# Patient Record
Sex: Male | Born: 1950 | Race: White | Hispanic: No | Marital: Married | Smoking: Former smoker
Health system: Southern US, Community
[De-identification: ages and names within clinical notes are randomized; demographics above are authoritative.]

## PROBLEM LIST (undated history)

## (undated) DIAGNOSIS — R0602 Shortness of breath: Secondary | ICD-10-CM

## (undated) DIAGNOSIS — IMO0002 Reserved for concepts with insufficient information to code with codable children: Secondary | ICD-10-CM

## (undated) DIAGNOSIS — IMO0001 Reserved for inherently not codable concepts without codable children: Secondary | ICD-10-CM

## (undated) DIAGNOSIS — C61 Malignant neoplasm of prostate: Secondary | ICD-10-CM

## (undated) DIAGNOSIS — N529 Male erectile dysfunction, unspecified: Secondary | ICD-10-CM

## (undated) DIAGNOSIS — R109 Unspecified abdominal pain: Secondary | ICD-10-CM

## (undated) DIAGNOSIS — K4020 Bilateral inguinal hernia, without obstruction or gangrene, not specified as recurrent: Secondary | ICD-10-CM

## (undated) DIAGNOSIS — N393 Stress incontinence (female) (male): Secondary | ICD-10-CM

## (undated) DIAGNOSIS — I10 Essential (primary) hypertension: Secondary | ICD-10-CM

## (undated) DIAGNOSIS — M1990 Unspecified osteoarthritis, unspecified site: Secondary | ICD-10-CM

## (undated) DIAGNOSIS — C801 Malignant (primary) neoplasm, unspecified: Secondary | ICD-10-CM

## (undated) HISTORY — DX: Reserved for concepts with insufficient information to code with codable children: IMO0002

## (undated) HISTORY — DX: Shortness of breath: R06.02

## (undated) HISTORY — DX: Reserved for inherently not codable concepts without codable children: IMO0001

---

## 1975-02-10 HISTORY — PX: HERNIA REPAIR: SHX51

## 2004-02-28 ENCOUNTER — Ambulatory Visit: Payer: Self-pay | Admitting: Internal Medicine

## 2011-02-10 HISTORY — PX: BIOPSY PROSTATE: PRO28

## 2011-04-02 ENCOUNTER — Other Ambulatory Visit (HOSPITAL_COMMUNITY): Payer: Self-pay | Admitting: Urology

## 2011-04-02 DIAGNOSIS — C61 Malignant neoplasm of prostate: Secondary | ICD-10-CM

## 2011-04-06 ENCOUNTER — Other Ambulatory Visit: Payer: Self-pay | Admitting: Urology

## 2011-04-09 ENCOUNTER — Ambulatory Visit (HOSPITAL_COMMUNITY)
Admission: RE | Admit: 2011-04-09 | Discharge: 2011-04-09 | Disposition: A | Discharge status: Home / Self Care | Payer: BC Managed Care – PPO | Source: Ambulatory Visit | Attending: Urology | Admitting: Urology

## 2011-04-09 DIAGNOSIS — C61 Malignant neoplasm of prostate: Secondary | ICD-10-CM

## 2011-04-09 MED ORDER — TECHNETIUM TC 99M MEDRONATE IV KIT
27.0000 | PACK | Freq: Once | INTRAVENOUS | Status: AC | PRN
  Administered 2011-04-09: 27 via INTRAVENOUS

## 2011-04-13 ENCOUNTER — Ambulatory Visit (HOSPITAL_COMMUNITY)
Admission: RE | Admit: 2011-04-13 | Discharge: 2011-04-13 | Disposition: A | Discharge status: Home / Self Care | Payer: BC Managed Care – PPO | Source: Ambulatory Visit | Attending: Urology | Admitting: Urology

## 2011-04-13 ENCOUNTER — Encounter (HOSPITAL_COMMUNITY)
Admission: RE | Admit: 2011-04-13 | Discharge: 2011-04-13 | Disposition: A | Discharge status: Home / Self Care | Payer: BC Managed Care – PPO | Source: Ambulatory Visit | Attending: Urology | Admitting: Urology

## 2011-04-13 ENCOUNTER — Encounter (HOSPITAL_COMMUNITY): Payer: Self-pay | Admitting: Pharmacy Technician

## 2011-04-13 ENCOUNTER — Encounter (HOSPITAL_COMMUNITY): Payer: Self-pay

## 2011-04-13 DIAGNOSIS — K4020 Bilateral inguinal hernia, without obstruction or gangrene, not specified as recurrent: Secondary | ICD-10-CM

## 2011-04-13 DIAGNOSIS — C801 Malignant (primary) neoplasm, unspecified: Secondary | ICD-10-CM

## 2011-04-13 DIAGNOSIS — I10 Essential (primary) hypertension: Secondary | ICD-10-CM | POA: Insufficient documentation

## 2011-04-13 DIAGNOSIS — Z1389 Encounter for screening for other disorder: Secondary | ICD-10-CM | POA: Insufficient documentation

## 2011-04-13 DIAGNOSIS — K4090 Unilateral inguinal hernia, without obstruction or gangrene, not specified as recurrent: Secondary | ICD-10-CM | POA: Insufficient documentation

## 2011-04-13 DIAGNOSIS — C61 Malignant neoplasm of prostate: Secondary | ICD-10-CM

## 2011-04-13 HISTORY — DX: Malignant (primary) neoplasm, unspecified: C80.1

## 2011-04-13 HISTORY — DX: Essential (primary) hypertension: I10

## 2011-04-13 HISTORY — DX: Bilateral inguinal hernia, without obstruction or gangrene, not specified as recurrent: K40.20

## 2011-04-13 LAB — CBC
HCT: 43.3 % (ref 39.0–52.0)
MCH: 30.9 pg (ref 26.0–34.0)
MCV: 88 fL (ref 78.0–100.0)
Platelets: 185 10*3/uL (ref 150–400)
RBC: 4.92 MIL/uL (ref 4.22–5.81)
RDW: 12.8 % (ref 11.5–15.5)

## 2011-04-13 LAB — BASIC METABOLIC PANEL
BUN: 13 mg/dL (ref 6–23)
CO2: 29 mEq/L (ref 19–32)
Calcium: 10 mg/dL (ref 8.4–10.5)
Chloride: 101 mEq/L (ref 96–112)
Creatinine, Ser: 1.01 mg/dL (ref 0.50–1.35)

## 2011-04-13 MED ORDER — GADOBENATE DIMEGLUMINE 529 MG/ML IV SOLN
20.0000 mL | Freq: Once | INTRAVENOUS | Status: AC | PRN
  Administered 2011-04-13: 20 mL via INTRAVENOUS

## 2011-04-13 NOTE — Patient Instructions (Addendum)
21 Karl Gentry  04/13/2011   Your procedure is scheduled on: 04-23-11  Report to [facility] at    0515   AM.  Call this number if you have problems the morning of surgery: [phone]   Remember:   Do not eat food:After Midnight.  Follow bowel prep instructions per Dr. Laverle Patter..  Take these medicines the morning of surgery with A SIP OF WATER: none. Take usual Pm meds day before.   Do not wear jewelry, make-up or nail polish.  Do not wear lotions, powders, or perfumes. You may wear deodorant.  Do not shave 48 hours prior to surgery.(may shave face and neck)  Do not bring valuables to the hospital.  Contacts, dentures or bridgework may not be worn into surgery.  Leave suitcase in the car. After surgery it may be brought to your room.  For patients admitted to the hospital, checkout time is 11:00 AM the day of discharge.   Patients discharged the day of surgery will not be allowed to drive home.  Name and phone number of your driver: [name]  Special Instructions: CHG Shower Use Special Wash: 1/2 bottle night before surgery and 1/2 bottle morning of surgery.(avoid face and genatalia).   Please read over the following fact sheets that you were given: MRSA Information, blood fact sheet, Incentive Spirometry instructions.   Your Pt has screened with an elevated risk for obstructive sleep apnea using the Stop-Bang tool during a presurgical  Visit. A score of four or greater is an elevated risk.

## 2011-04-13 NOTE — Pre-Procedure Instructions (Addendum)
CXR done 04-13-11 today. 04-14-11 EKG (10'12)received from PCP-placed with chart.W. Kennon Portela

## 2011-04-14 ENCOUNTER — Encounter (HOSPITAL_COMMUNITY): Payer: Self-pay

## 2011-04-22 NOTE — H&P (Signed)
Chief Complaint  Prostate cancer   History of Present Illness Karl Gentry is a 61 year old gentleman who was noted to have elevated PSA of 6.3 on a routine evaluation by Dr. Dossie Arbour. He was seen by Dr. Leonette Monarch and his repeat PSA was 5.5. He was recommended to undergo a prostate biopsy which he did on 03/12/11. 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 He has no family history of prostate cancer. He has been well informed about his treatment options and is leaning toward surgical therapy. He has not undergone any staging studies.  TNM stage: cT2b Nx Mx (left apical induration extending toward the left mid gland, no obvious extraprostatic extension) PSA: 5.5 Gleason score: 4+4 = 8 Prostate biopsy (03/12/11, [facility], read by Mardella Layman, Acc # [id number]): 5/12 cores positive    Left: Left lateral apex (11%, 4+4 = 8), left mid (20%, 3+3 = 6), left lateral base (16%, 3+3 = 6), left base (10%, 3+3 = 6)    Right: Right mid (4%, 3+4 = 7) Prostate volume: 36 cc  Urinary function: He does have frequent urinary frequency and a weak stream. The symptoms are not particular bothersome. IPSS 10/0. Erectile function: He has mild erectile dysfunction treated with Cialis 5 mg daily. SHIM score with Cialis: 23.      Past Medical History Problems  1. History of  Asymptomatic Hyperuricemia 790.6 2. History of  Hypertension 401.9 3. History of  Prostate Cancer V10.46  Surgical History Problems  1. History of  Hernia Repair Right  Current Meds 1. Allopurinol 300 MG Oral Tablet; Therapy: 14Nov2012 to 2. AmLODIPine Besylate 10 MG Oral Tablet; Therapy: 20Dec2012 to 3. Benicar 20 MG Oral Tablet; Therapy: 14Nov2012 to  Allergies Medication  1. No Known Drug Allergies  Family History Problems  1. Family history of  Breast Cancer V16.3 Denied  2. Family history of  Prostate Cancer  Social History Problems    Marital  History - Currently Married   Never A Smoker Denied    History of  Alcohol Use  Review of Systems Genitourinary, constitutional, skin, eye, otolaryngeal, hematologic/lymphatic, cardiovascular, pulmonary, endocrine, musculoskeletal, gastrointestinal, neurological and psychiatric system(s) were reviewed and pertinent findings if present are noted.  Integumentary: skin rash/lesion and pruritus.  Musculoskeletal: back pain and joint pain.    Vitals Vital Signs [Data Includes: Last 1 Day]  21Feb2013 09:00AM  BMI Calculated: 35.62 BSA Calculated: 2.19 Height: 5 ft 8 in Weight: 235 lb  Blood Pressure: 120 / 70 Heart Rate: 96  Physical Exam Constitutional: Well nourished and well developed . No acute distress.  ENT:. The ears and nose are normal in appearance.  Neck: The appearance of the neck is normal and no neck mass is present.  Pulmonary: No respiratory distress, normal respiratory rhythm and effort and clear bilateral breath sounds.  Cardiovascular: Heart rate and rhythm are normal . No peripheral edema.  Abdomen: The abdomen is mildly obese. The abdomen is soft and nontender. No masses are palpated. No CVA tenderness. No hernias are palpable. No hepatosplenomegaly noted.  Rectal: Rectal exam demonstrates normal sphincter tone, no tenderness and no masses. Prostate size is estimated to be 45 g. He does have induration extending from the left apex upper and left mid gland concerning for malignancy. There is no definite prostatic extension. The prostate has no nodularity and is not tender. The left seminal vesicle is nonpalpable. The right seminal vesicle is nonpalpable. The perineum is  normal on inspection.  Lymphatics: The femoral and inguinal nodes are not enlarged or tender.  Skin: Normal skin turgor, no visible rash and no visible skin lesions.  Neuro/Psych:. Mood and affect are appropriate.    Results/Data Urine [Data Includes: Last 1 Day]   21Feb2013 COLOR YELLOW   APPEARANCE CLEAR  SPECIFIC GRAVITY 1.025  pH 5.5  GLUCOSE NEG mg/dL BILIRUBIN NEG  KETONE NEG mg/dL BLOOD NEG  PROTEIN NEG mg/dL UROBILINOGEN 0.2 mg/dL NITRITE NEG  LEUKOCYTE ESTERASE NEG    I have independently reviewed his medical records, pathology report, and PSA results and findings as dictated above.   Assessment Assessed  1. Prostate Cancer 185  Plan Health Maintenance (V70.0)  1. UA With REFLEX  Done: 21Feb2013 08:50AM Prostate Cancer (185)  2. Diazepam 10 MG Oral Tablet; TAKE 1 TABLET Once Take 1 hour prior to MRI; Therapy:  21Feb2013 to (Complete:22Feb2013); Last Rx:21Feb2013 3. CREATININE with eGFR  Done: 21Feb2013 4. VENIPUNCTURE  Done: 21Feb2013 5. Follow-up Schedule Surgery Office  Follow-up  Done: 21Feb2013 6. PT Referral Referral  Referral  Requested for: 04Mar2013  Discussion/Summary  1. Prostate cancer: I recommended that he proceed with staging studies considering his high risk localized prostate cancer. He will undergo bone scan imaging and an endorectal MRI of the prostate and pelvis. His MRI will also help to evaluate local extension of the prostate cancer particularly on the left side of the gland. Assuming no evidence for metastatic disease, we have reviewed options including both primary surgery and radiation therapy. Considering his high-risk disease, we have discussed androgen deprivation therapy in addition to radiation. He would like to proceed with surgical therapy and declines a radiation oncology consultation.   The patient was counseled about the natural history of prostate cancer and the standard treatment options that are available for prostate cancer. It was explained to him how his age and life expectancy, clinical stage, Gleason score, and PSA affect his prognosis, the decision to proceed with additional staging studies, as well as how that information influences recommended treatment strategies. We discussed the roles for active surveillance,  radiation therapy, surgical therapy, androgen deprivation, as well as ablative therapy options for the treatment of prostate cancer as appropriate to his individual cancer situation. We discussed the risks and benefits of these options with regard to their impact on cancer control and also in terms of potential adverse events, complications, and impact on quiality of life particularly related to urinary, bowel, and sexual function. The patient was encouraged to ask questions throughout the discussion today and all questions were answered to his stated satisfaction. In addition, the patient was provided with and/or directed to appropriate resources and literature for further education about prostate cancer and treatment options.   We discussed surgical therapy for prostate cancer including the different available surgical approaches. We discussed, in detail, the risks and expectations of surgery with regard to cancer control, urinary control, and erectile function as well as the expected postoperative recovery process. Additional risks of surgery including but not limited to bleeding, infection, hernia formation, nerve damage, lymphocele formation, bowel/rectal injury potentially necessitating colostomy, damage to the urinary tract resulting in urine leakage, urethral stricture, and the cardiopulmonary risks such as myocardial infarction, stroke, death, venothromboembolism, etc. were explained. The risk of open surgical conversion for robotic/laparoscopic prostatectomy was also discussed.     He will tentatively be scheduled for a right nerve sparing (with possible partial left nerve sparing versus non-nerve sparing) robotic prostatectomy and pelvic lymphadenectomy.  A  total of 85 minutes were spent in the overall care of the patient today with 65 minutes in direct face to face consultation.    Amendment  Bone scan was negative for metastases. MRI also negative for local extension or lymph node involvement.  Patient informed.  Amended By: Heloise Purpura; 04/14/2011 2:40 PMEST   Bone scan was negative for metastases. Amended By: Heloise Purpura; 04/10/2011 7:42 AMEST

## 2011-04-23 ENCOUNTER — Encounter (HOSPITAL_COMMUNITY): Payer: Self-pay

## 2011-04-23 ENCOUNTER — Encounter (HOSPITAL_COMMUNITY): Payer: Self-pay | Admitting: Anesthesiology

## 2011-04-23 ENCOUNTER — Encounter (HOSPITAL_COMMUNITY)
Admission: RE | Disposition: A | Discharge status: Home / Self Care | Payer: Self-pay | Source: Ambulatory Visit | Attending: Urology

## 2011-04-23 ENCOUNTER — Ambulatory Visit (HOSPITAL_COMMUNITY): Payer: BC Managed Care – PPO | Admitting: Anesthesiology

## 2011-04-23 ENCOUNTER — Inpatient Hospital Stay (HOSPITAL_COMMUNITY)
Admission: RE | Admit: 2011-04-23 | Discharge: 2011-04-24 | DRG: 335 | Disposition: A | Discharge status: Home / Self Care | Payer: BC Managed Care – PPO | Source: Ambulatory Visit | Attending: Urology | Admitting: Urology

## 2011-04-23 DIAGNOSIS — C61 Malignant neoplasm of prostate: Secondary | ICD-10-CM

## 2011-04-23 DIAGNOSIS — I10 Essential (primary) hypertension: Secondary | ICD-10-CM | POA: Diagnosis present

## 2011-04-23 DIAGNOSIS — Z8546 Personal history of malignant neoplasm of prostate: Secondary | ICD-10-CM | POA: Insufficient documentation

## 2011-04-23 HISTORY — DX: Malignant neoplasm of prostate: C61

## 2011-04-23 HISTORY — PX: ROBOT ASSISTED LAPAROSCOPIC RADICAL PROSTATECTOMY: SHX5141

## 2011-04-23 LAB — ABO/RH: ABO/RH(D): A POS

## 2011-04-23 LAB — TYPE AND SCREEN: ABO/RH(D): A POS

## 2011-04-23 SURGERY — ROBOTIC ASSISTED LAPAROSCOPIC RADICAL PROSTATECTOMY LEVEL 2
Anesthesia: General | Site: Pelvis | Wound class: Clean Contaminated

## 2011-04-23 MED ORDER — HYDROCODONE-ACETAMINOPHEN 5-325 MG PO TABS: 1.0000 | ORAL_TABLET | Freq: Four times a day (QID) | ORAL | Status: AC | PRN

## 2011-04-23 MED ORDER — FENTANYL CITRATE 0.05 MG/ML IJ SOLN
INTRAMUSCULAR | Status: DC | PRN
  Administered 2011-04-23: 100 ug via INTRAVENOUS
  Administered 2011-04-23: 50 ug via INTRAVENOUS
  Administered 2011-04-23: 100 ug via INTRAVENOUS
  Administered 2011-04-23 (×2): 50 ug via INTRAVENOUS

## 2011-04-23 MED ORDER — EPHEDRINE SULFATE 50 MG/ML IJ SOLN
INTRAMUSCULAR | Status: DC | PRN
  Administered 2011-04-23 (×3): 5 mg via INTRAVENOUS

## 2011-04-23 MED ORDER — NEOSTIGMINE METHYLSULFATE 1 MG/ML IJ SOLN
INTRAMUSCULAR | Status: DC | PRN
  Administered 2011-04-23: 4 mg via INTRAVENOUS

## 2011-04-23 MED ORDER — CEFAZOLIN SODIUM-DEXTROSE 2-3 GM-% IV SOLR
2.0000 g | Freq: Once | INTRAVENOUS | Status: AC
  Administered 2011-04-23: 2 g via INTRAVENOUS

## 2011-04-23 MED ORDER — ACETAMINOPHEN 325 MG PO TABS: 650.0000 mg | ORAL_TABLET | ORAL | Status: DC | PRN

## 2011-04-23 MED ORDER — MORPHINE SULFATE 2 MG/ML IJ SOLN: 2.0000 mg | INTRAMUSCULAR | Status: DC | PRN

## 2011-04-23 MED ORDER — LACTATED RINGERS IV SOLN
INTRAVENOUS | Status: DC | PRN
  Administered 2011-04-23 (×3): via INTRAVENOUS

## 2011-04-23 MED ORDER — CEFAZOLIN SODIUM 1-5 GM-% IV SOLN
INTRAVENOUS | Status: AC
  Filled 2011-04-23: qty 100

## 2011-04-23 MED ORDER — ALLOPURINOL 300 MG PO TABS
300.0000 mg | ORAL_TABLET | Freq: Every day | ORAL | Status: DC
  Administered 2011-04-23 – 2011-04-24 (×2): 300 mg via ORAL
  Filled 2011-04-23 (×2): qty 1

## 2011-04-23 MED ORDER — HYDROMORPHONE HCL PF 1 MG/ML IJ SOLN
0.2500 mg | INTRAMUSCULAR | Status: DC | PRN
  Administered 2011-04-23: 0.25 mg via INTRAVENOUS
  Administered 2011-04-23: 0.5 mg via INTRAVENOUS

## 2011-04-23 MED ORDER — ACETAMINOPHEN 10 MG/ML IV SOLN
INTRAVENOUS | Status: AC
  Filled 2011-04-23: qty 100

## 2011-04-23 MED ORDER — DIPHENHYDRAMINE HCL 50 MG/ML IJ SOLN: 12.5000 mg | Freq: Four times a day (QID) | INTRAMUSCULAR | Status: DC | PRN

## 2011-04-23 MED ORDER — BUPIVACAINE-EPINEPHRINE 0.25% -1:200000 IJ SOLN
INTRAMUSCULAR | Status: DC | PRN
  Administered 2011-04-23: 27 mL

## 2011-04-23 MED ORDER — HYDROMORPHONE HCL PF 1 MG/ML IJ SOLN
INTRAMUSCULAR | Status: AC
  Administered 2011-04-23: 0.25 mg via INTRAVENOUS
  Filled 2011-04-23: qty 1

## 2011-04-23 MED ORDER — DOCUSATE SODIUM 100 MG PO CAPS
100.0000 mg | ORAL_CAPSULE | Freq: Two times a day (BID) | ORAL | Status: DC
  Administered 2011-04-23 – 2011-04-24 (×3): 100 mg via ORAL
  Filled 2011-04-23 (×4): qty 1

## 2011-04-23 MED ORDER — STERILE WATER FOR IRRIGATION IR SOLN
Status: DC | PRN
  Administered 2011-04-23: 3000 mL

## 2011-04-23 MED ORDER — CISATRACURIUM BESYLATE 2 MG/ML IV SOLN
INTRAVENOUS | Status: DC | PRN
  Administered 2011-04-23 (×4): 2 mg via INTRAVENOUS
  Administered 2011-04-23: 12 mg via INTRAVENOUS
  Administered 2011-04-23: 6 mg via INTRAVENOUS

## 2011-04-23 MED ORDER — MIDAZOLAM HCL 5 MG/5ML IJ SOLN
INTRAMUSCULAR | Status: DC | PRN
  Administered 2011-04-23: 2 mg via INTRAVENOUS

## 2011-04-23 MED ORDER — KCL IN DEXTROSE-NACL 20-5-0.45 MEQ/L-%-% IV SOLN
INTRAVENOUS | Status: DC
  Administered 2011-04-23: 16:00:00 via INTRAVENOUS
  Filled 2011-04-23 (×5): qty 1000

## 2011-04-23 MED ORDER — HEPARIN SODIUM (PORCINE) 1000 UNIT/ML IJ SOLN
INTRAMUSCULAR | Status: AC
  Filled 2011-04-23: qty 1

## 2011-04-23 MED ORDER — SODIUM CHLORIDE 0.9 % IV BOLUS (SEPSIS)
1000.0000 mL | Freq: Once | INTRAVENOUS | Status: AC
  Administered 2011-04-23: 1000 mL via INTRAVENOUS

## 2011-04-23 MED ORDER — INDIGOTINDISULFONATE SODIUM 8 MG/ML IJ SOLN
INTRAMUSCULAR | Status: AC
  Filled 2011-04-23: qty 10

## 2011-04-23 MED ORDER — KETOROLAC TROMETHAMINE 15 MG/ML IJ SOLN
15.0000 mg | Freq: Four times a day (QID) | INTRAMUSCULAR | Status: DC
  Administered 2011-04-23 – 2011-04-24 (×3): 15 mg via INTRAVENOUS
  Filled 2011-04-23 (×4): qty 1

## 2011-04-23 MED ORDER — DIPHENHYDRAMINE HCL 12.5 MG/5ML PO ELIX: 12.5000 mg | ORAL_SOLUTION | Freq: Four times a day (QID) | ORAL | Status: DC | PRN

## 2011-04-23 MED ORDER — SODIUM CHLORIDE 0.9 % IR SOLN
Status: DC | PRN
  Administered 2011-04-23: 1000 mL

## 2011-04-23 MED ORDER — ACETAMINOPHEN 10 MG/ML IV SOLN
INTRAVENOUS | Status: DC | PRN
  Administered 2011-04-23: 1000 mg via INTRAVENOUS

## 2011-04-23 MED ORDER — KETOROLAC TROMETHAMINE 15 MG/ML IJ SOLN
INTRAMUSCULAR | Status: AC
  Filled 2011-04-23: qty 1

## 2011-04-23 MED ORDER — INDIGOTINDISULFONATE SODIUM 8 MG/ML IJ SOLN
INTRAMUSCULAR | Status: DC | PRN
  Administered 2011-04-23 (×2): 5 mL via INTRAVENOUS

## 2011-04-23 MED ORDER — LIDOCAINE HCL (CARDIAC) 20 MG/ML IV SOLN
INTRAVENOUS | Status: DC | PRN
  Administered 2011-04-23: 75 mg via INTRAVENOUS

## 2011-04-23 MED ORDER — PROPOFOL 10 MG/ML IV BOLUS
INTRAVENOUS | Status: DC | PRN
  Administered 2011-04-23: 200 mg via INTRAVENOUS

## 2011-04-23 MED ORDER — ONDANSETRON HCL 4 MG/2ML IJ SOLN
INTRAMUSCULAR | Status: DC | PRN
  Administered 2011-04-23: 4 mg via INTRAVENOUS

## 2011-04-23 MED ORDER — CIPROFLOXACIN HCL 500 MG PO TABS: 500.0000 mg | ORAL_TABLET | Freq: Two times a day (BID) | ORAL | Status: AC

## 2011-04-23 MED ORDER — AMLODIPINE BESYLATE 10 MG PO TABS
10.0000 mg | ORAL_TABLET | Freq: Every day | ORAL | Status: DC
  Administered 2011-04-23 – 2011-04-24 (×2): 10 mg via ORAL
  Filled 2011-04-23 (×2): qty 1

## 2011-04-23 MED ORDER — LACTATED RINGERS IV SOLN
INTRAVENOUS | Status: DC | PRN
  Administered 2011-04-23: 09:00:00

## 2011-04-23 MED ORDER — LACTATED RINGERS IV SOLN
INTRAVENOUS | Status: DC
  Administered 2011-04-23: 12:00:00 via INTRAVENOUS

## 2011-04-23 MED ORDER — CEFAZOLIN SODIUM 1-5 GM-% IV SOLN
1.0000 g | Freq: Three times a day (TID) | INTRAVENOUS | Status: AC
  Administered 2011-04-23 (×2): 1 g via INTRAVENOUS
  Filled 2011-04-23 (×2): qty 50

## 2011-04-23 MED ORDER — GLYCOPYRROLATE 0.2 MG/ML IJ SOLN
INTRAMUSCULAR | Status: DC | PRN
  Administered 2011-04-23: 0.6 mg via INTRAVENOUS

## 2011-04-23 MED ORDER — BUPIVACAINE-EPINEPHRINE 0.25% -1:200000 IJ SOLN
INTRAMUSCULAR | Status: AC
  Filled 2011-04-23: qty 1

## 2011-04-23 SURGICAL SUPPLY — 35 items
CANISTER SUCTION 2500CC (MISCELLANEOUS) ×3 IMPLANT
CATH ROBINSON RED A/P 8FR (CATHETERS) ×3 IMPLANT
CHLORAPREP W/TINT 26ML (MISCELLANEOUS) ×3 IMPLANT
CLIP LIGATING HEM O LOK PURPLE (MISCELLANEOUS) ×6 IMPLANT
CLOTH BEACON ORANGE TIMEOUT ST (SAFETY) ×3 IMPLANT
CORD HIGH FREQUENCY UNIPOLAR (ELECTROSURGICAL) ×3 IMPLANT
COVER SURGICAL LIGHT HANDLE (MISCELLANEOUS) ×3 IMPLANT
COVER TIP SHEARS 8 DVNC (MISCELLANEOUS) ×2 IMPLANT
COVER TIP SHEARS 8MM DA VINCI (MISCELLANEOUS) ×1
CUTTER ECHEON FLEX ENDO 45 340 (ENDOMECHANICALS) ×3 IMPLANT
DECANTER SPIKE VIAL GLASS SM (MISCELLANEOUS) IMPLANT
DRAPE SURG IRRIG POUCH 19X23 (DRAPES) ×3 IMPLANT
DRSG TEGADERM 6X8 (GAUZE/BANDAGES/DRESSINGS) IMPLANT
ELECT REM PT RETURN 9FT ADLT (ELECTROSURGICAL) ×3
ELECTRODE REM PT RTRN 9FT ADLT (ELECTROSURGICAL) ×2 IMPLANT
GLOVE BIO SURGEON STRL SZ 6.5 (GLOVE) ×3 IMPLANT
GLOVE BIOGEL M STRL SZ7.5 (GLOVE) ×6 IMPLANT
GOWN STRL NON-REIN LRG LVL3 (GOWN DISPOSABLE) ×6 IMPLANT
GOWN STRL REIN XL XLG (GOWN DISPOSABLE) ×6 IMPLANT
HOLDER FOLEY CATH W/STRAP (MISCELLANEOUS) ×3 IMPLANT
IV LACTATED RINGERS 1000ML (IV SOLUTION) IMPLANT
KIT ACCESSORY DA VINCI DISP (KITS) ×1
KIT ACCESSORY DVNC DISP (KITS) ×2 IMPLANT
NDL SAFETY ECLIPSE 18X1.5 (NEEDLE) ×2 IMPLANT
NEEDLE HYPO 18GX1.5 SHARP (NEEDLE) ×1
PACK ROBOT UROLOGY CUSTOM (CUSTOM PROCEDURE TRAY) ×3 IMPLANT
RELOAD GREEN ECHELON 45 (STAPLE) ×3 IMPLANT
SET TUBE IRRIG SUCTION NO TIP (IRRIGATION / IRRIGATOR) ×3 IMPLANT
SOLUTION ELECTROLUBE (MISCELLANEOUS) ×3 IMPLANT
SPONGE GAUZE 4X4 12PLY (GAUZE/BANDAGES/DRESSINGS) IMPLANT
SUT VICRYL 0 UR6 27IN ABS (SUTURE) ×6 IMPLANT
SYR 27GX1/2 1ML LL SAFETY (SYRINGE) ×3 IMPLANT
TOWEL OR 17X26 10 PK STRL BLUE (TOWEL DISPOSABLE) ×3 IMPLANT
TOWEL OR NON WOVEN STRL DISP B (DISPOSABLE) ×3 IMPLANT
WATER STERILE IRR 1500ML POUR (IV SOLUTION) ×6 IMPLANT

## 2011-04-23 NOTE — Interval H&P Note (Signed)
History and Physical Interval Note:  04/23/2011 7:18 AM  Karl Gentry  has presented today for surgery, with the diagnosis of Prostate Cancer  The various methods of treatment have been discussed with the patient and family. After consideration of risks, benefits and other options for treatment, the patient has consented to  Procedure(s) (LRB): ROBOTIC ASSISTED LAPAROSCOPIC RADICAL PROSTATECTOMY LEVEL 2 (N/A) LYMPHADENECTOMY (Bilateral) as a surgical intervention .  The patients' history has been reviewed, patient examined, no change in status, stable for surgery.   Questions were answered to the patient's satisfaction.     Almalik Weissberg,LES

## 2011-04-23 NOTE — Anesthesia Postprocedure Evaluation (Signed)
  Anesthesia Post-op Note  Patient: Karl Gentry  Procedure(s) Performed: Procedure(s) (LRB): ROBOTIC ASSISTED LAPAROSCOPIC RADICAL PROSTATECTOMY LEVEL 2 (N/A) LYMPHADENECTOMY (Bilateral)  Patient Location: PACU  Anesthesia Type: General  Level of Consciousness: oriented and sedated  Airway and Oxygen Therapy: Patient Spontanous Breathing and Patient connected to nasal cannula oxygen  Post-op Pain: mild  Post-op Assessment: Post-op Vital signs reviewed, Patient's Cardiovascular Status Stable, Respiratory Function Stable and Patent Airway  Post-op Vital Signs: stable  Complications: No apparent anesthesia complications

## 2011-04-23 NOTE — Transfer of Care (Signed)
Immediate Anesthesia Transfer of Care Note  Patient: Karl Gentry  Procedure(s) Performed: Procedure(s) (LRB): ROBOTIC ASSISTED LAPAROSCOPIC RADICAL PROSTATECTOMY LEVEL 2 (N/A) LYMPHADENECTOMY (Bilateral)  Patient Location: PACU  Anesthesia Type: General  Level of Consciousness: awake, oriented and patient cooperative  Airway & Oxygen Therapy: Patient Spontanous Breathing and Patient connected to face mask oxygen  Post-op Assessment: Report given to PACU RN and Post -op Vital signs reviewed and stable  Post vital signs: Reviewed and stable  Complications: No apparent anesthesia complications

## 2011-04-23 NOTE — Discharge Instructions (Signed)

## 2011-04-23 NOTE — Progress Notes (Signed)
Patient ID: Karl Gentry, male   DOB: 1950-06-28, 61 y.o.   MRN: [id number]  Post-op note  Subjective: The patient is doing well.  No complaints.  Objective: Vital signs in last 24 hours: Temp:  [97.3 F (36.3 C)-97.8 F (36.6 C)] 97.5 F (36.4 C) (03/14 1235) Pulse Rate:  [83-91] 90  (03/14 1235) Resp:  [10-18] 16  (03/14 1235) BP: (95-123)/(52-72) 123/72 mmHg (03/14 1235) SpO2:  [94 %-100 %] 94 % (03/14 1235) Weight:  [106.595 kg (235 lb)] 106.595 kg (235 lb) (03/14 1235)  Intake/Output from previous day:   Intake/Output this shift: Total I/O In: 4260 [I.V.:3000; Other:210; IV Piggyback:1050] Out: 555 [Urine:395; Drains:110; Blood:50]  Physical Exam:  General: Alert and oriented. Abdomen: Soft, Nondistended. Incisions: Clean and dry.  Lab Results:  Basename 04/23/11 1056  HGB 13.5  HCT 39.5    Assessment/Plan: POD#0   1) Continue to monitor   [name]. MD   LOS: 0 days   Asheley Hellberg,LES 04/23/2011, 5:09 PM

## 2011-04-23 NOTE — Op Note (Signed)
Preoperative diagnosis: Clinically localized adenocarcinoma of the prostate (clinical stage cT2b N0 M0)  Postoperative diagnosis: Clinically localized adenocarcinoma of the prostate (clinical stage cT2b N0 M0)  Procedure:  1. Robotic assisted laparoscopic radical prostatectomy (bilateral nerve sparing - partial on left) 2. Bilateral robotic assisted laparoscopic pelvic lymphadenectomy  Surgeon: [name]. M.D.  Assistant: Pecola Leisure, PA-C  Anesthesia: General  Complications: None  EBL: 50 mL  IVF:  2000 mL crystalloid  Specimens: 1. Prostate and seminal vesicles 2. Right pelvic lymph nodes 3. Left pelvic lymph nodes  Disposition of specimens: Pathology  Drains: 1. 20 Fr coude catheter 2. # 19 Blake pelvic drain  Indication: Karl Gentry is a 61 y.o. year old patient with clinically localized prostate cancer.  After a thorough review of the management options for treatment of prostate cancer, he elected to proceed with surgical therapy and the above procedure(s).  We have discussed the potential benefits and risks of the procedure, side effects of the proposed treatment, the likelihood of the patient achieving the goals of the procedure, and any potential problems that might occur during the procedure or recuperation. Informed consent has been obtained.  Description of procedure:  The patient was taken to the operating room and a general anesthetic was administered. He was given preoperative antibiotics, placed in the dorsal lithotomy position, and prepped and draped in the usual sterile fashion. Next a preoperative timeout was performed. A urethral catheter was placed into the bladder and a site was selected near the umbilicus for placement of the camera port. This was placed using a standard open Hassan technique which allowed entry into the peritoneal cavity under direct vision and without difficulty. A 12 mm port was placed and a pneumoperitoneum  established. The camera was then used to inspect the abdomen and there was no evidence of any intra-abdominal injuries or other abnormalities. The remaining abdominal ports were then placed. 8 mm robotic ports were placed in the right lower quadrant, left lower quadrant, and far left lateral abdominal wall. A 5 mm port was placed in the right upper quadrant and a 12 mm port was placed in the right lateral abdominal wall for laparoscopic assistance. All ports were placed under direct vision without difficulty. The surgical cart was then docked.   Utilizing the cautery scissors, the bladder was reflected posteriorly allowing entry into the space of Retzius and identification of the endopelvic fascia and prostate. The periprostatic fat was then removed from the prostate allowing full exposure of the endopelvic fascia. The endopelvic fascia was then incised from the apex back to the base of the prostate bilaterally and the underlying levator muscle fibers were swept laterally off the prostate thereby isolating the dorsal venous complex. The dorsal vein was then stapled and divided with a 45 mm Flex Echelon stapler. Attention then turned to the bladder neck which was divided anteriorly thereby allowing entry into the bladder and exposure of the urethral catheter. The catheter balloon was deflated and the catheter was brought into the operative field and used to retract the prostate anteriorly. The posterior bladder neck was then examined and was divided allowing further dissection between the bladder and prostate posteriorly until the vasa deferentia and seminal vessels were identified. The vasa deferentia were isolated, divided, and lifted anteriorly. The seminal vesicles were dissected down to their tips with care to control the seminal vascular arterial blood supply. These structures were then lifted anteriorly and the space between Denonvillier's fascia and the anterior rectum was developed  with a combination of  sharp and blunt dissection. This isolated the vascular pedicles of the prostate.  The lateral prostatic fascia was then sharply incised allowing release of the neurovascular bundles bilaterally. The vascular pedicles of the prostate were then ligated with Weck clips between the prostate and neurovascular bundles and divided with sharp cold scissor dissection resulting in neurovascular bundle preservation. The neurovascular bundles were then separated off the apex of the prostate and urethra bilaterally. A partial nerve sparing techniques was employed on the left side with some periprostatic fat left on the prostate.  The urethra was then sharply transected allowing the prostate specimen to be disarticulated. The pelvis was copiously irrigated and hemostasis was ensured. There was no evidence for rectal injury.  Attention then turned to the right pelvic sidewall. The fibrofatty tissue between the external iliac vein, confluence of the iliac vessels, hypogastric artery, and Cooper's ligament was dissected free from the pelvic sidewall with care to preserve the obturator nerve. Weck clips were used for lymphostasis and hemostasis. An identical procedure was performed on the contralateral side and the lymphatic packets were removed for permanent pathologic analysis.  Attention then turned to the urethral anastomosis. A 2-0 Vicryl slip knot was placed between Denonvillier's fascia, the posterior bladder neck, and the posterior urethra to reapproximate these structures. A double-armed 3-0 Monocryl suture was then used to perform a 360 running tension-free anastomosis between the bladder neck and urethra. A new urethral catheter was then placed into the bladder and irrigated. There were no blood clots within the bladder and the anastomosis appeared to be watertight. A #19 Blake drain was then brought through the left lateral 8 mm port site and positioned appropriately within the pelvis. It was secured to the skin  with a nylon suture. The surgical cart was then undocked. The right lateral 12 mm port site was closed at the fascial level with a 0 Vicryl suture placed laparoscopically. All remaining ports were then removed under direct vision. The prostate specimen was removed intact within the Endopouch retrieval bag via the periumbilical camera port site. This fascial opening was closed with two running 0 Vicryl sutures. 0.25% Marcaine was then injected into all port sites and all incisions were reapproximated at the skin level with staples. Sterile dressings were applied. The patient appeared to tolerate the procedure well and without complications. The patient was able to be extubated and transferred to the recovery unit in satisfactory condition.   [name] MD

## 2011-04-23 NOTE — Anesthesia Preprocedure Evaluation (Signed)
Anesthesia Evaluation  Patient identified by MRN, date of birth, ID band Patient awake    Reviewed: Allergy & Precautions, H&P , NPO status , Patient's Chart, lab work & pertinent test results, reviewed documented beta blocker date and time   Airway Mallampati: II TM Distance: >3 FB Neck ROM: Full    Dental  (+) Teeth Intact   Pulmonary neg pulmonary ROS,  breath sounds clear to auscultation        Cardiovascular hypertension, Pt. on medications Rhythm:Regular Rate:Normal  Denies cardiac symptoms   Neuro/Psych negative neurological ROS  negative psych ROS   GI/Hepatic negative GI ROS, Neg liver ROS,   Endo/Other  negative endocrine ROS  Renal/GU negative Renal ROS   Prostate cancer    Musculoskeletal negative musculoskeletal ROS (+)   Abdominal   Peds negative pediatric ROS (+)  Hematology negative hematology ROS (+)   Anesthesia Other Findings   Reproductive/Obstetrics negative OB ROS                           Anesthesia Physical Anesthesia Plan  ASA: II  Anesthesia Plan: General   Post-op Pain Management:    Induction: Intravenous  Airway Management Planned: Oral ETT  Additional Equipment:   Intra-op Plan:   Post-operative Plan: Extubation in OR  Informed Consent:   Dental advisory given  Plan Discussed with: CRNA and Surgeon  Anesthesia Plan Comments:         Anesthesia Quick Evaluation

## 2011-04-24 MED ORDER — HYDROCODONE-ACETAMINOPHEN 5-325 MG PO TABS
1.0000 | ORAL_TABLET | Freq: Four times a day (QID) | ORAL | Status: DC | PRN
  Administered 2011-04-24: 1 via ORAL
  Filled 2011-04-24: qty 1

## 2011-04-24 MED ORDER — BISACODYL 10 MG RE SUPP
10.0000 mg | Freq: Once | RECTAL | Status: AC
  Administered 2011-04-24: 10 mg via RECTAL
  Filled 2011-04-24: qty 1

## 2011-04-24 NOTE — Discharge Summary (Signed)
Date of admission: 04/23/2011  Date of discharge: 04/24/2011  Admission diagnosis: Prostate Cancer  Discharge diagnosis: Prostate Cancer  History and Physical: For full details, please see admission history and physical. Briefly, Karl Gentry is a 61 y.o. gentleman with localized prostate cancer.  After discussing management/treatment options, he elected to proceed with surgical treatment.  Hospital Course: Karl Gentry was taken to the operating room on 04/23/2011 and underwent a robotic assisted laparoscopic radical prostatectomy. He tolerated this procedure well and without complications. Postoperatively, he was able to be transferred to a regular hospital room following recovery from anesthesia.  He was able to begin ambulating the night of surgery. He remained hemodynamically stable overnight.  He had excellent urine output with appropriately minimal output from his pelvic drain and his pelvic drain was removed on POD #1.  He was transitioned to oral pain medication, tolerated a clear liquid diet, and had met all discharge criteria and was able to be discharged home later on POD#1.  Laboratory values:  Basename 04/24/11 0430 04/23/11 1056  HGB 13.2 13.5  HCT 39.8 39.5    Disposition: Home  Discharge instruction: He was instructed to be ambulatory but to refrain from heavy lifting, strenuous activity, or driving. He was instructed on urethral catheter care.  Discharge medications:   Medication List  As of 04/24/2011  1:01 PM   START taking these medications         ciprofloxacin 500 MG tablet   Commonly known as: CIPRO   Take 1 tablet (500 mg total) by mouth 2 (two) times daily. Start day prior to office visit for foley removal      HYDROcodone-acetaminophen 5-325 MG per tablet   Commonly known as: NORCO   Take 1-2 tablets by mouth every 6 (six) hours as needed for pain.         CONTINUE taking these medications         allopurinol 300 MG tablet   Commonly known as:  ZYLOPRIM      amLODipine 10 MG tablet   Commonly known as: NORVASC      olmesartan 20 MG tablet   Commonly known as: BENICAR          Where to get your medications    These are the prescriptions that you need to pick up.   You may get these medications from any pharmacy.         ciprofloxacin 500 MG tablet   HYDROcodone-acetaminophen 5-325 MG per tablet            Followup: He will followup in 1 week for catheter removal and to discuss his surgical pathology results.

## 2011-04-24 NOTE — Progress Notes (Signed)
Patient ID: Karl Gentry, male   DOB: 06-07-1950, 62 y.o.   MRN: [id number]  1 Day Post-Op Subjective: The patient is doing well.  No nausea or vomiting. Pain is adequately controlled.  Objective: Vital signs in last 24 hours: Temp:  [97.4 F (36.3 C)-99 F (37.2 C)] 99 F (37.2 C) (03/15 0544) Pulse Rate:  [81-92] 92  (03/15 0544) Resp:  [10-16] 16  (03/15 0544) BP: (95-131)/(52-72) 131/71 mmHg (03/15 0544) SpO2:  [94 %-100 %] 95 % (03/15 0544) Weight:  [106.595 kg (235 lb)] 106.595 kg (235 lb) (03/14 1235)  Intake/Output from previous day: 03/14 0701 - 03/15 0700 In: 7430 [P.O.:900; I.V.:5230; IV Piggyback:1050] Out: 3725 [Urine:3495; Drains:180; Blood:50] Intake/Output this shift:    Physical Exam:  General: Alert and oriented. CV: RRR Lungs: Clear bilaterally. GI: Soft, Nondistended. Incisions: Dressings intact. Urine: Clear Extremities: Nontender, no erythema, no edema.  Lab Results:  Basename 04/24/11 0430 04/23/11 1056  HGB 13.2 13.5  HCT 39.8 39.5      Assessment/Plan: POD# 1 s/p robotic prostatectomy.  1) SL IVF 2) Ambulate, Incentive spirometry 3) Transition to oral pain medication 4) Dulcolax suppository 5) D/C pelvic drain 6) Plan for likely discharge later today   Karl Gentry. MD   LOS: 1 day   Karl Gentry,LES 04/24/2011, 7:10 AM

## 2011-05-06 ENCOUNTER — Encounter (HOSPITAL_COMMUNITY): Payer: Self-pay | Admitting: Urology

## 2011-05-20 ENCOUNTER — Ambulatory Visit: Payer: Self-pay

## 2012-01-27 NOTE — Progress Notes (Signed)
New Consult Prostate Cancer  Biopsy=3/14/213,Radical resection/Prostatectomy,=Adenocarcinoma gleason= 4+3=7 [place] Biopsy done 03/12/11=gleason=4+4=8, & 3+3=6Volume=36cc,PSA=5.5  PSA  01/21/2012=0.23  Married, alert,oriented x3, 2 children, no dysuria, Mother breast cancer living, father colon cancer living, no c/o pain  Allergies:NKDA   No pacemaker No radiation

## 2012-01-27 NOTE — Progress Notes (Signed)
New Consult Prostate Cancer Biopsy

## 2012-01-28 ENCOUNTER — Ambulatory Visit
Admission: RE | Admit: 2012-01-28 | Discharge: 2012-01-28 | Disposition: A | Discharge status: Home / Self Care | Payer: BC Managed Care – PPO | Source: Ambulatory Visit | Attending: Radiation Oncology | Admitting: Radiation Oncology

## 2012-01-28 ENCOUNTER — Encounter: Payer: Self-pay | Admitting: Radiation Oncology

## 2012-01-28 VITALS — BP 126/78 | HR 92 | Temp 98.3°F | Resp 20 | Ht 67.0 in | Wt 244.0 lb

## 2012-01-28 DIAGNOSIS — Z803 Family history of malignant neoplasm of breast: Secondary | ICD-10-CM | POA: Insufficient documentation

## 2012-01-28 DIAGNOSIS — C61 Malignant neoplasm of prostate: Secondary | ICD-10-CM

## 2012-01-28 DIAGNOSIS — N529 Male erectile dysfunction, unspecified: Secondary | ICD-10-CM | POA: Insufficient documentation

## 2012-01-28 DIAGNOSIS — I10 Essential (primary) hypertension: Secondary | ICD-10-CM | POA: Insufficient documentation

## 2012-01-28 DIAGNOSIS — Z7982 Long term (current) use of aspirin: Secondary | ICD-10-CM | POA: Insufficient documentation

## 2012-01-28 DIAGNOSIS — Z79899 Other long term (current) drug therapy: Secondary | ICD-10-CM | POA: Insufficient documentation

## 2012-01-28 DIAGNOSIS — Z800 Family history of malignant neoplasm of digestive organs: Secondary | ICD-10-CM | POA: Insufficient documentation

## 2012-01-28 DIAGNOSIS — Z87891 Personal history of nicotine dependence: Secondary | ICD-10-CM | POA: Insufficient documentation

## 2012-01-28 HISTORY — DX: Unspecified osteoarthritis, unspecified site: M19.90

## 2012-01-28 HISTORY — DX: Malignant neoplasm of prostate: C61

## 2012-01-28 HISTORY — DX: Stress incontinence (female) (male): N39.3

## 2012-01-28 HISTORY — DX: Male erectile dysfunction, unspecified: N52.9

## 2012-01-28 NOTE — Addendum Note (Signed)
Encounter addended by: Maryln Gottron, MD on: 01/28/2012  6:22 PM<BR>     Documentation filed: Notes Section

## 2012-01-28 NOTE — Progress Notes (Signed)
Please see the Nurse Progress Note in the MD Initial Consult Encounter for this patient. 

## 2012-01-28 NOTE — Progress Notes (Addendum)
[facility] Radiation Oncology NEW PATIENT EVALUATION  Name: Karl Gentry MRN: [id number]  Date:   01/28/2012           DOB: May 15, 1950  Status: outpatient   CC:   Karl Mc, MD ,  Dr. Dossie Arbour, PCP   REFERRING PHYSICIAN: Crecencio Mc, MD   DIAGNOSIS: PSA recurrent carcinoma the prostate   HISTORY OF PRESENT ILLNESS:  Karl Gentry is a 61 y.o. male who is seen today for the courtesy Dr. Laverle Patter for discussion of possible salvage radiation therapy in the management of his PSA recurrent carcinoma the prostate. He presented with a PSA of 6.3 on routine evaluation by his primary care physician. A repeat PSA was 5.5. He underwent ultrasound-guided biopsies by Dr. Leonette Monarch on March 12, 2011 revealing a Gleason score of 8 (4+4) with 5 of 12 biopsy cores positive for malignancy. Dr. Laverle Patter appreciated right apical induration extending towards the right mid gland. His prostate volume was 36 cc. His preoperative MRI scan on 04/13/2011 showed a highly suspicious area within left apex with no evidence of spread to the seminal vesicle. A bone scan on February 28 was without evidence for metastatic disease. He underwent a robotic prostatectomy by Dr. Laverle Patter on 04/23/2011. He was found to have  Gleason 7 (4+3) involving less than 5% of the prostatic parenchyma. There was involvement of the apex and true margins were negative, but tumor was seen less than 1 mm from the capsular surface at the left apex.. 3 right pelvic lymph nodes and 2 left pelvic lymph nodes were free of metastatic disease. His first postoperative PSA on 10/23/2011 was 0.17. A followup PSA on 01/15/2012 was 0.22 and a PSA on 01/21/2012 was 0.23. He is doing reasonable well from a GU and GI standpoint., He does have slight stress incontinence when bending over to work. He is dry at home. He does have erectile dysfunction. No GI difficulties.  PREVIOUS RADIATION THERAPY: No   PAST MEDICAL HISTORY:  has a past medical history  of Hypertension; Inguinal hernia bilateral, non-recurrent (04-13-11); Cancer (04-13-11); Prostate cancer (04/23/11); ED (erectile dysfunction); Stress incontinence, male; and Arthritis.     PAST SURGICAL HISTORY:  Past Surgical History  Procedure Date  . Hernia repair 04-13-11    '76 -open rt. right inguinal hernia repair/mesh  . Biopsy prostate 04-13-11    bx. 2'13 [place] [facility]  . Robot assisted laparoscopic radical prostatectomy 04/23/2011    Procedure: ROBOTIC ASSISTED LAPAROSCOPIC RADICAL PROSTATECTOMY LEVEL 2;  Surgeon: Karl Mc, MD;  Location: [facility]S;  Service: Urology;  Laterality: N/A;         FAMILY HISTORY: family history includes Cancer in his father and mother. His father died from complications of congestive heart failure. He also had colon cancer. His mother is alive with a history of breast cancer and Alzheimer's disease. No family history of prostate cancer.   SOCIAL HISTORY:  reports that he has never smoked. He has quit using smokeless tobacco. He reports that he does not drink alcohol or use illicit drugs. Married, 2 children. 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   ALLERGIES: Review of patient's allergies indicates no known allergies.   MEDICATIONS:  Current Outpatient Prescriptions  Medication Sig Dispense Refill  . amLODipine (NORVASC) 10 MG tablet Take 10 mg by mouth daily at 12 noon.      Marland Kitchen aspirin 81 MG tablet Take 81 mg by mouth daily.      Marland Kitchen  fish oil-omega-3 fatty acids 1000 MG capsule Take 2 g by mouth daily.      . Multiple Vitamin (MULTIVITAMIN) tablet Take 1 tablet by mouth daily.      Marland Kitchen olmesartan (BENICAR) 20 MG tablet Take 20 mg by mouth daily at 12 noon.      . tadalafil (CIALIS) 10 MG tablet Take 10 mg by mouth daily as needed.         REVIEW OF SYSTEMS:  Pertinent items are noted in HPI.    PHYSICAL EXAM:  height is 5\' 7"  (1.702 m) and weight is 244 lb (110.678 kg). His oral temperature is 98.3 F  (36.8 C). His blood pressure is 126/78 and his pulse is 92. His respiration is 20.   Alert and oriented. Head neck examination: Grossly unremarkable. Nodes: Without palpable cervical or supraclavicular lymphadenopathy. Chest: Lungs clear. Back: Without spinal or CVA tenderness. Heart: Regular in rhythm. Abdomen: Surgical scars, without masses organomegaly. Genitalia unremarkable to inspection. Rectal the prostate bed is flat and is without masses or nodularity. Extremities: Without edema. Neurologic examination: Grossly nonfocal.   LABORATORY DATA:  Lab Results  Component Value Date   WBC 6.7 04/13/2011   HGB 13.2 04/24/2011   HCT 39.8 04/24/2011   MCV 88.0 04/13/2011   PLT 185 04/13/2011   Lab Results  Component Value Date   NA 138 04/13/2011   K 4.1 04/13/2011   CL 101 04/13/2011   CO2 29 04/13/2011   No results found for this basename: ALT, AST, GGT, ALKPHOS, BILITOT   PSA from 01/21/2012 0.23   IMPRESSION: PSA recurrent carcinoma the prostate. I explained to the patient that he has a local recurrence alone, local and distant recurrence, or distant recurrence alone. Clinical predictors for having a local recurrence include a positive margin, disease-free interval, initial PSA of 7 or less and a slow PSA doubling time. The most important predictor is a positive margin. I reviewed his pathology with Dr. Adolphus Birchwood, and he verifies that there is less than 1 mm margin along his left apex. While this is not a positive margin, this is close enough to believe that he may be a risk for residual disease along his apex. He did have a brief disease-free interval, and his initial Gleason score with a pattern of 4 is certainly worrisome for the possibility of metastatic disease. I explained to the patient that his chance for salvage in this setting is probably no greater than 15-20%, but considering his young age and apical tumor involvement I would be willing to offer him radiation therapy which is his only chance for  cure.. We discussed the potential acute and late toxicities of radiation therapy which I think would be relatively well tolerated. We also talked about techniques including being treated with a comfortably full bladder to minimize urinary related radiation toxicity. I told the patient that I would contact him following review of his pathology, and I left a voicemail. He'll contact me if he wants to consider radiation therapy.   PLAN: As discussed above.   I spent 60 minutes minutes face to face with the patient and more than 50% of that time was spent in counseling and/or coordination of care.

## 2012-01-29 NOTE — Addendum Note (Signed)
Encounter addended by: Talyia Allende Mintz Elan Mcelvain, RN on: 01/29/2012  6:09 PM<BR>     Documentation filed: Charges VN

## 2012-02-18 ENCOUNTER — Encounter: Payer: Self-pay | Admitting: Radiation Oncology

## 2012-02-18 NOTE — Progress Notes (Signed)
Chart note: The patient called today and he wants to proceed with radiation therapy to his prostate bed in the management of his PSA recurrent carcinoma the prostate. I will have her return early next week for his simulation/treatment planning. We discussed planning and treating him with a comfortably full bladder.

## 2012-02-22 ENCOUNTER — Ambulatory Visit
Admission: RE | Admit: 2012-02-22 | Discharge: 2012-02-22 | Disposition: A | Discharge status: Home / Self Care | Payer: BC Managed Care – PPO | Source: Ambulatory Visit | Attending: Radiation Oncology | Admitting: Radiation Oncology

## 2012-02-22 ENCOUNTER — Telehealth: Payer: Self-pay | Admitting: Radiation Oncology

## 2012-02-22 DIAGNOSIS — C61 Malignant neoplasm of prostate: Secondary | ICD-10-CM | POA: Insufficient documentation

## 2012-02-22 DIAGNOSIS — Z510 Encounter for antineoplastic radiation therapy: Secondary | ICD-10-CM | POA: Insufficient documentation

## 2012-02-22 NOTE — Progress Notes (Signed)
Simulation/treatment planning note: The patient was taken to the CT simulator. A VAC LOC immobilization device was constructed. A red rubber cath was placed within the rectal vault. He was then catheterized and contrast instilled into the bladder/urethra. I contoured his high-risk prostate tumor bed, CTV 6600 and expanded this by 0.5 cm to create PTV 6600 which will receive 6600 cGy in 33 sessions. I also contoured avoidance structures including the bladder and rectum. He is now ready for IMRT simulation/treatment planning.

## 2012-02-22 NOTE — Telephone Encounter (Signed)
Met w patient to discuss RO billing. Pt had no financial concerns today.  Dx: Prostate  Attending Rad: RM  Rad Tx:  IMRT x 40

## 2012-02-25 ENCOUNTER — Encounter: Payer: Self-pay | Admitting: Radiation Oncology

## 2012-02-25 NOTE — Progress Notes (Signed)
IMRT simulation/treatment planning note: The patient completed IMRT simulation/planning in the management of his carcinoma the prostate. IMRT was chosen to decrease the risk for both acute and late rectal and bladder toxicity compared to 3-D conformal or conventional radiation therapy. Dose volume histograms were obtained for the target structure, high-risk prostate tumor bed. Dose volume histograms were also obtained for avoidance structures including the rectum, bladder, and femoral heads. We met our departmental goals. Please see the electronic medical record for specific dose volume histograms. I requesting daily MV CT setting up to his anterior rectum. He is be treated with a company full bladder.

## 2012-03-02 ENCOUNTER — Ambulatory Visit
Admission: RE | Admit: 2012-03-02 | Discharge: 2012-03-02 | Disposition: A | Discharge status: Home / Self Care | Payer: BC Managed Care – PPO | Source: Ambulatory Visit | Attending: Radiation Oncology | Admitting: Radiation Oncology

## 2012-03-02 DIAGNOSIS — C61 Malignant neoplasm of prostate: Secondary | ICD-10-CM

## 2012-03-02 NOTE — Progress Notes (Signed)
Chart note: The patient underwent Tomotherapy segmentation on 03/02/2012 for treatment of his prostate cancer. He is being treated to 6.0 delivered field widths corresponding to one set of IMRT treatment devices [phone])

## 2012-03-03 ENCOUNTER — Ambulatory Visit
Admission: RE | Admit: 2012-03-03 | Discharge: 2012-03-03 | Disposition: A | Discharge status: Home / Self Care | Payer: BC Managed Care – PPO | Source: Ambulatory Visit | Attending: Radiation Oncology | Admitting: Radiation Oncology

## 2012-03-04 ENCOUNTER — Ambulatory Visit
Admission: RE | Admit: 2012-03-04 | Discharge: 2012-03-04 | Disposition: A | Discharge status: Home / Self Care | Payer: BC Managed Care – PPO | Source: Ambulatory Visit | Attending: Radiation Oncology | Admitting: Radiation Oncology

## 2012-03-07 ENCOUNTER — Ambulatory Visit
Admission: RE | Admit: 2012-03-07 | Discharge: 2012-03-07 | Disposition: A | Discharge status: Home / Self Care | Payer: BC Managed Care – PPO | Source: Ambulatory Visit | Attending: Radiation Oncology | Admitting: Radiation Oncology

## 2012-03-07 ENCOUNTER — Encounter: Payer: Self-pay | Admitting: Radiation Oncology

## 2012-03-07 VITALS — BP 153/81 | HR 75 | Temp 98.0°F | Resp 20 | Wt 244.4 lb

## 2012-03-07 DIAGNOSIS — C61 Malignant neoplasm of prostate: Secondary | ICD-10-CM

## 2012-03-07 NOTE — Progress Notes (Signed)
Weekly Management Note:  Site: Prostate bed Current Dose:  800  cGy Projected Dose: 6600  cGy  Narrative: The patient is seen today for routine under treatment assessment. CBCT/MVCT images/port films were reviewed. The chart was reviewed.   Bladder filling acceptable, but not ideal. Images were reviewed with the patient. He did not feel that his bladder was performed today. No new GU or GI difficulties.  Physical Examination:  Filed Vitals:   03/07/12 1137  BP: 153/81  Pulse: 75  Temp: 98 F (36.7 C)  Resp: 20  .  Weight: 244 lb 6.4 oz (110.859 kg). No change.  Impression: Tolerating radiation therapy well.  Plan: Continue radiation therapy as planned.

## 2012-03-07 NOTE — Progress Notes (Signed)
Post sim ed completed, charted under post sim ed appt. Pt denies pain, urinary/bowel issues, fatigue, loss of appetite.

## 2012-03-08 ENCOUNTER — Ambulatory Visit
Admission: RE | Admit: 2012-03-08 | Discharge: 2012-03-08 | Disposition: A | Discharge status: Home / Self Care | Payer: BC Managed Care – PPO | Source: Ambulatory Visit | Attending: Radiation Oncology | Admitting: Radiation Oncology

## 2012-03-08 NOTE — Progress Notes (Addendum)
Post sim ed completed w/pt. Gave pt "Radiation and You" booklet w/all pertinent information marked and discussed, re: fatigue, bowel issues/care, urinary irritation/care, nutrition, pain. All questions answered.

## 2012-03-09 ENCOUNTER — Ambulatory Visit
Admission: RE | Admit: 2012-03-09 | Discharge: 2012-03-09 | Disposition: A | Discharge status: Home / Self Care | Payer: BC Managed Care – PPO | Source: Ambulatory Visit | Attending: Radiation Oncology | Admitting: Radiation Oncology

## 2012-03-10 ENCOUNTER — Ambulatory Visit
Admission: RE | Admit: 2012-03-10 | Discharge: 2012-03-10 | Disposition: A | Discharge status: Home / Self Care | Payer: BC Managed Care – PPO | Source: Ambulatory Visit | Attending: Radiation Oncology | Admitting: Radiation Oncology

## 2012-03-11 ENCOUNTER — Ambulatory Visit
Admission: RE | Admit: 2012-03-11 | Discharge: 2012-03-11 | Disposition: A | Discharge status: Home / Self Care | Payer: BC Managed Care – PPO | Source: Ambulatory Visit | Attending: Radiation Oncology | Admitting: Radiation Oncology

## 2012-03-14 ENCOUNTER — Ambulatory Visit
Admission: RE | Admit: 2012-03-14 | Discharge: 2012-03-14 | Disposition: A | Discharge status: Home / Self Care | Payer: BC Managed Care – PPO | Source: Ambulatory Visit | Attending: Radiation Oncology | Admitting: Radiation Oncology

## 2012-03-14 ENCOUNTER — Encounter: Payer: Self-pay | Admitting: Radiation Oncology

## 2012-03-14 VITALS — BP 151/73 | HR 82 | Temp 99.1°F | Resp 20 | Wt 242.3 lb

## 2012-03-14 DIAGNOSIS — C61 Malignant neoplasm of prostate: Secondary | ICD-10-CM

## 2012-03-14 NOTE — Progress Notes (Signed)
Patient here s/p 9/33 rad txs  prostate so far, no c/o pain, no nocturia,patient works 3rd shift, so he's not sure about fatigue, a little increased urgency not enough for him to be sure, no dysuria

## 2012-03-14 NOTE — Progress Notes (Signed)
Weekly Management Note:  Site: prostate bed Current Dose:  1800  cGy Projected Dose: 6600  cGy  Narrative: The patient is seen today for routine under treatment assessment. CBCT/MVCT images/port films were reviewed. The chart was reviewed.   Excellent bladder filling today. No new GU or GI difficulties.  Physical Examination:  Filed Vitals:   03/14/12 1109  BP: 151/73  Pulse: 82  Temp: 99.1 F (37.3 C)  Resp: 20  .  Weight: 242 lb 4.8 oz (109.907 kg). No change  Impression: Tolerating radiation therapy well.  Plan: Continue radiation therapy as planned.

## 2012-03-15 ENCOUNTER — Ambulatory Visit
Admission: RE | Admit: 2012-03-15 | Discharge: 2012-03-15 | Disposition: A | Discharge status: Home / Self Care | Payer: BC Managed Care – PPO | Source: Ambulatory Visit | Attending: Radiation Oncology | Admitting: Radiation Oncology

## 2012-03-16 ENCOUNTER — Ambulatory Visit
Admission: RE | Admit: 2012-03-16 | Discharge: 2012-03-16 | Disposition: A | Discharge status: Home / Self Care | Payer: BC Managed Care – PPO | Source: Ambulatory Visit | Attending: Radiation Oncology | Admitting: Radiation Oncology

## 2012-03-17 ENCOUNTER — Ambulatory Visit
Admission: RE | Admit: 2012-03-17 | Discharge: 2012-03-17 | Disposition: A | Discharge status: Home / Self Care | Payer: BC Managed Care – PPO | Source: Ambulatory Visit | Attending: Radiation Oncology | Admitting: Radiation Oncology

## 2012-03-18 ENCOUNTER — Ambulatory Visit
Admission: RE | Admit: 2012-03-18 | Discharge: 2012-03-18 | Disposition: A | Discharge status: Home / Self Care | Payer: BC Managed Care – PPO | Source: Ambulatory Visit | Attending: Radiation Oncology | Admitting: Radiation Oncology

## 2012-03-21 ENCOUNTER — Encounter: Payer: Self-pay | Admitting: Radiation Oncology

## 2012-03-21 ENCOUNTER — Ambulatory Visit
Admission: RE | Admit: 2012-03-21 | Discharge: 2012-03-21 | Disposition: A | Discharge status: Home / Self Care | Payer: BC Managed Care – PPO | Source: Ambulatory Visit | Attending: Radiation Oncology | Admitting: Radiation Oncology

## 2012-03-21 VITALS — BP 151/79 | HR 83 | Temp 98.2°F | Resp 20 | Wt 245.3 lb

## 2012-03-21 DIAGNOSIS — C61 Malignant neoplasm of prostate: Secondary | ICD-10-CM

## 2012-03-21 NOTE — Progress Notes (Signed)
Weekly Management Note:  Site: Prostate bed Current Dose:  2800  cGy Projected Dose: 6600  cGy  Narrative: The patient is seen today for routine under treatment assessment. CBCT/MVCT images/port films were reviewed. The chart was reviewed.  Bladder filling is borderline acceptable. No new GU or GI difficulties although he may be having more urinary frequency.  Physical Examination:  Filed Vitals:   03/21/12 1103  BP: 151/79  Pulse: 83  Temp: 98.2 F (36.8 C)  Resp: 20  .  Weight: 245 lb 4.8 oz (111.267 kg). No change .  Impression: Tolerating radiation therapy well. I encouraged him to make a better effort with respect to bladder filling. He did better last week.  Plan: Continue radiation therapy as planned.

## 2012-03-21 NOTE — Progress Notes (Signed)
Pt denies pain, loss of appetite, bladder/bowel issues. He states he is no more fatigued than his usual.

## 2012-03-22 ENCOUNTER — Ambulatory Visit
Admission: RE | Admit: 2012-03-22 | Discharge: 2012-03-22 | Disposition: A | Discharge status: Home / Self Care | Payer: BC Managed Care – PPO | Source: Ambulatory Visit | Attending: Radiation Oncology | Admitting: Radiation Oncology

## 2012-03-23 ENCOUNTER — Ambulatory Visit
Admission: RE | Admit: 2012-03-23 | Discharge: 2012-03-23 | Disposition: A | Discharge status: Home / Self Care | Payer: BC Managed Care – PPO | Source: Ambulatory Visit | Attending: Radiation Oncology | Admitting: Radiation Oncology

## 2012-03-24 ENCOUNTER — Ambulatory Visit
Admission: RE | Admit: 2012-03-24 | Discharge: 2012-03-24 | Disposition: A | Discharge status: Home / Self Care | Payer: BC Managed Care – PPO | Source: Ambulatory Visit | Attending: Radiation Oncology | Admitting: Radiation Oncology

## 2012-03-25 ENCOUNTER — Ambulatory Visit: Payer: BC Managed Care – PPO

## 2012-03-28 ENCOUNTER — Ambulatory Visit
Admission: RE | Admit: 2012-03-28 | Discharge: 2012-03-28 | Disposition: A | Discharge status: Home / Self Care | Payer: BC Managed Care – PPO | Source: Ambulatory Visit | Attending: Radiation Oncology | Admitting: Radiation Oncology

## 2012-03-28 ENCOUNTER — Encounter: Payer: Self-pay | Admitting: Radiation Oncology

## 2012-03-28 VITALS — BP 138/85 | HR 77 | Temp 98.3°F | Resp 20 | Wt 244.3 lb

## 2012-03-28 DIAGNOSIS — C61 Malignant neoplasm of prostate: Secondary | ICD-10-CM

## 2012-03-28 NOTE — Progress Notes (Signed)
Weekly Management Note:  Site: Prostate bed Current Dose:  3600   cGy Projected Dose: 6600  cGy  Narrative: The patient is seen today for routine under treatment assessment. CBCT/MVCT images/port films were reviewed. The chart was reviewed.   Bladder filling is suboptimal today. He did not feel as if his bladder was full. He does have some increasing urinary frequency and urgency but is otherwise doing well.  Physical Examination:  Filed Vitals:   03/28/12 1112  BP: 138/85  Pulse: 77  Temp: 98.3 F (36.8 C)  Resp: 20  .  Weight: 244 lb 4.8 oz (110.814 kg). No change.  Impression: Tolerating radiation therapy well. I encouraged him to improve his bladder filling to minimize his urinary toxicity.  Plan: Continue radiation therapy as planned.

## 2012-03-28 NOTE — Progress Notes (Addendum)
Patient here prostate  rad txs 18/ completed so far, alert,oriented x3, slight dysuria has started and increased urgency, stated , eating and drinking fair, says "I;m always tired",doesn't  Get up t night , regular bowels 11:14 AM

## 2012-03-29 ENCOUNTER — Ambulatory Visit
Admission: RE | Admit: 2012-03-29 | Discharge: 2012-03-29 | Disposition: A | Discharge status: Home / Self Care | Payer: BC Managed Care – PPO | Source: Ambulatory Visit | Attending: Radiation Oncology | Admitting: Radiation Oncology

## 2012-03-30 ENCOUNTER — Ambulatory Visit
Admission: RE | Admit: 2012-03-30 | Discharge: 2012-03-30 | Disposition: A | Discharge status: Home / Self Care | Payer: BC Managed Care – PPO | Source: Ambulatory Visit | Attending: Radiation Oncology | Admitting: Radiation Oncology

## 2012-03-31 ENCOUNTER — Ambulatory Visit
Admission: RE | Admit: 2012-03-31 | Discharge: 2012-03-31 | Disposition: A | Discharge status: Home / Self Care | Payer: BC Managed Care – PPO | Source: Ambulatory Visit | Attending: Radiation Oncology | Admitting: Radiation Oncology

## 2012-04-01 ENCOUNTER — Ambulatory Visit
Admission: RE | Admit: 2012-04-01 | Discharge: 2012-04-01 | Disposition: A | Discharge status: Home / Self Care | Payer: BC Managed Care – PPO | Source: Ambulatory Visit | Attending: Radiation Oncology | Admitting: Radiation Oncology

## 2012-04-04 ENCOUNTER — Encounter: Payer: Self-pay | Admitting: Radiation Oncology

## 2012-04-04 ENCOUNTER — Ambulatory Visit
Admission: RE | Admit: 2012-04-04 | Discharge: 2012-04-04 | Disposition: A | Discharge status: Home / Self Care | Payer: BC Managed Care – PPO | Source: Ambulatory Visit | Attending: Radiation Oncology | Admitting: Radiation Oncology

## 2012-04-04 ENCOUNTER — Ambulatory Visit
Admission: RE | Admit: 2012-04-04 | Payer: BC Managed Care – PPO | Source: Ambulatory Visit | Admitting: Radiation Oncology

## 2012-04-04 VITALS — BP 133/74 | HR 71 | Temp 98.6°F | Ht 68.0 in | Wt 244.5 lb

## 2012-04-04 DIAGNOSIS — C61 Malignant neoplasm of prostate: Secondary | ICD-10-CM

## 2012-04-04 NOTE — Progress Notes (Signed)
Weekly Management Note:  Site: Prostate bed Current Dose:  4600  cGy Projected Dose: 6600  cGy  Narrative: The patient is seen today for routine under treatment assessment. CBCT/MVCT images/port films were reviewed. The chart was reviewed.   Bladder filling today is suboptimal. He tells that his bladder do not feel full today. No GU or GI difficulties although he does have slight increasing urinary frequency and dysuria. His symptoms are not particularly bothersome .  Physical Examination:  Filed Vitals:   04/04/12 1107  BP: 133/74  Pulse: 71  Temp: 98.6 F (37 C)  .  Weight: 244 lb 8 oz (110.904 kg). No change .  Impression: Tolerating radiation therapy well. He'll try to improve his bladder filling.  Plan: Continue radiation therapy as planned.

## 2012-04-04 NOTE — Progress Notes (Signed)
23 fractions to the pelvis for prostate cancer C/o mild stinging each time he voids with frequency during the day.  He denies any nocturia, nor proctitis.  No changes noted in bowel pattern.  No fatigue presently.

## 2012-04-05 ENCOUNTER — Ambulatory Visit
Admission: RE | Admit: 2012-04-05 | Discharge: 2012-04-05 | Disposition: A | Discharge status: Home / Self Care | Payer: BC Managed Care – PPO | Source: Ambulatory Visit | Attending: Radiation Oncology | Admitting: Radiation Oncology

## 2012-04-06 ENCOUNTER — Ambulatory Visit
Admission: RE | Admit: 2012-04-06 | Discharge: 2012-04-06 | Disposition: A | Discharge status: Home / Self Care | Payer: BC Managed Care – PPO | Source: Ambulatory Visit | Attending: Radiation Oncology | Admitting: Radiation Oncology

## 2012-04-07 ENCOUNTER — Ambulatory Visit
Admission: RE | Admit: 2012-04-07 | Discharge: 2012-04-07 | Disposition: A | Discharge status: Home / Self Care | Payer: BC Managed Care – PPO | Source: Ambulatory Visit | Attending: Radiation Oncology | Admitting: Radiation Oncology

## 2012-04-08 ENCOUNTER — Ambulatory Visit
Admission: RE | Admit: 2012-04-08 | Discharge: 2012-04-08 | Disposition: A | Discharge status: Home / Self Care | Payer: BC Managed Care – PPO | Source: Ambulatory Visit | Attending: Radiation Oncology | Admitting: Radiation Oncology

## 2012-04-11 ENCOUNTER — Ambulatory Visit: Payer: BC Managed Care – PPO

## 2012-04-12 ENCOUNTER — Ambulatory Visit
Admission: RE | Admit: 2012-04-12 | Discharge: 2012-04-12 | Disposition: A | Discharge status: Home / Self Care | Payer: BC Managed Care – PPO | Source: Ambulatory Visit | Attending: Radiation Oncology | Admitting: Radiation Oncology

## 2012-04-12 ENCOUNTER — Encounter: Payer: Self-pay | Admitting: Radiation Oncology

## 2012-04-12 VITALS — BP 132/64 | HR 86 | Temp 97.6°F | Wt 242.6 lb

## 2012-04-12 DIAGNOSIS — C61 Malignant neoplasm of prostate: Secondary | ICD-10-CM

## 2012-04-12 NOTE — Progress Notes (Signed)
Weekly Management Note:  Site: Prostate Current Dose:  5600  cGy Projected Dose: 6600  cGy  Narrative: The patient is seen today for routine under treatment assessment. CBCT/MVCT images/port films were reviewed. The chart was reviewed.   Bladder filling is satisfactory. No new GU or GI difficulties.  Physical Examination:  Filed Vitals:   04/12/12 1046  BP: 132/64  Pulse: 86  Temp: 97.6 F (36.4 C)  .  Weight: 242 lb 9.6 oz (110.043 kg). No change .  Impression: Tolerating radiation therapy well.  Plan: Continue radiation therapy as planned.

## 2012-04-12 NOTE — Progress Notes (Signed)
Karl Gentry is here for his weekly under treatment visit.  He has had 28/33 fractions.  He denies pain at this time other than occasional burning with urination.  He does have frequency and urgency with urination.  He has had incontinence since prostate surgery which he states is getting better.  He denies nocturia.  He does have fatigue.

## 2012-04-13 ENCOUNTER — Ambulatory Visit
Admission: RE | Admit: 2012-04-13 | Discharge: 2012-04-13 | Disposition: A | Discharge status: Home / Self Care | Payer: BC Managed Care – PPO | Source: Ambulatory Visit | Attending: Radiation Oncology | Admitting: Radiation Oncology

## 2012-04-14 ENCOUNTER — Ambulatory Visit: Payer: BC Managed Care – PPO

## 2012-04-15 ENCOUNTER — Ambulatory Visit
Admission: RE | Admit: 2012-04-15 | Discharge: 2012-04-15 | Disposition: A | Discharge status: Home / Self Care | Payer: BC Managed Care – PPO | Source: Ambulatory Visit | Attending: Radiation Oncology | Admitting: Radiation Oncology

## 2012-04-18 ENCOUNTER — Ambulatory Visit
Admission: RE | Admit: 2012-04-18 | Discharge: 2012-04-18 | Disposition: A | Discharge status: Home / Self Care | Payer: BC Managed Care – PPO | Source: Ambulatory Visit | Attending: Radiation Oncology | Admitting: Radiation Oncology

## 2012-04-18 ENCOUNTER — Ambulatory Visit: Payer: BC Managed Care – PPO

## 2012-04-18 VITALS — BP 142/80 | HR 79 | Temp 98.3°F | Wt 242.1 lb

## 2012-04-18 DIAGNOSIS — C61 Malignant neoplasm of prostate: Secondary | ICD-10-CM

## 2012-04-18 NOTE — Progress Notes (Signed)
Weekly Management Note:  Site: Prostate bed Current Dose:  6200  cGy Projected Dose: 6600  cGy  Narrative: The patient is seen today for routine under treatment assessment. CBCT/MVCT images/port films were reviewed. The chart was reviewed.   Bladder filling is excellent. No new GU or GI difficulties. He finishes his radiation therapy this Wednesday.  Physical Examination:  Filed Vitals:   04/18/12 1111  BP: 142/80  Pulse: 79  Temp: 98.3 F (36.8 C)  .  Weight: 242 lb 1.6 oz (109.816 kg). No change.  Impression: Tolerating radiation therapy well.  Plan: Continue radiation therapy as planned. He'll finish his treatment this Wednesday and return to see me in one month.

## 2012-04-18 NOTE — Progress Notes (Signed)
Patient for routine weekly assessment of prostate cancer treatment.Completed 31 of 33 treatment.Denies pain.No nocturia.Urine stream good.Mild frequency and urgency.Bowels normal.Fatigue no mor than usual.

## 2012-04-19 ENCOUNTER — Ambulatory Visit: Payer: BC Managed Care – PPO

## 2012-04-19 ENCOUNTER — Ambulatory Visit
Admission: RE | Admit: 2012-04-19 | Discharge: 2012-04-19 | Disposition: A | Discharge status: Home / Self Care | Payer: BC Managed Care – PPO | Source: Ambulatory Visit | Attending: Radiation Oncology | Admitting: Radiation Oncology

## 2012-04-20 ENCOUNTER — Encounter: Payer: Self-pay | Admitting: Radiation Oncology

## 2012-04-20 ENCOUNTER — Ambulatory Visit: Payer: BC Managed Care – PPO

## 2012-04-20 ENCOUNTER — Ambulatory Visit
Admission: RE | Admit: 2012-04-20 | Discharge: 2012-04-20 | Disposition: A | Discharge status: Home / Self Care | Payer: BC Managed Care – PPO | Source: Ambulatory Visit | Attending: Radiation Oncology | Admitting: Radiation Oncology

## 2012-04-20 NOTE — Progress Notes (Signed)
[facility] Radiation Oncology End of Treatment Note  Name:Karl Gentry  Date: 04/20/2012 JYN:[id number] DOB:Oct 25, 1950   Status:outpatient    CC: Dr. Heloise Purpura, Dr. Dossie Arbour PCP  REFERRING PHYSICIAN:   Dr. Heloise Purpura    DIAGNOSIS: PSA recurrent carcinoma the prostate    INDICATION FOR TREATMENT: Curative   TREATMENT DATES: 03/02/2012 through 04/20/2012                          SITE/DOSE:    Prostate bed 6600 cGy 33 sessions                        BEAMS/ENERGY:   6 MV photons helical IMRT Tomotherapy               NARRATIVE:   The patient tolerated his treatment without significant GU or GI toxicity.                         PLAN: Routine followup in one month. Patient instructed to call if questions or worsening complaints in interim.

## 2012-04-21 ENCOUNTER — Ambulatory Visit: Payer: BC Managed Care – PPO

## 2012-04-22 ENCOUNTER — Ambulatory Visit: Payer: BC Managed Care – PPO

## 2012-04-25 ENCOUNTER — Ambulatory Visit: Payer: BC Managed Care – PPO

## 2012-04-26 ENCOUNTER — Ambulatory Visit: Payer: BC Managed Care – PPO

## 2012-05-20 ENCOUNTER — Encounter: Payer: Self-pay | Admitting: Oncology

## 2012-05-25 ENCOUNTER — Encounter: Payer: Self-pay | Admitting: Radiation Oncology

## 2012-05-25 ENCOUNTER — Ambulatory Visit
Admission: RE | Admit: 2012-05-25 | Discharge: 2012-05-25 | Disposition: A | Discharge status: Home / Self Care | Payer: BC Managed Care – PPO | Source: Ambulatory Visit | Attending: Radiation Oncology | Admitting: Radiation Oncology

## 2012-05-25 VITALS — BP 153/77 | HR 71 | Temp 99.0°F | Ht 68.0 in | Wt 244.6 lb

## 2012-05-25 DIAGNOSIS — C61 Malignant neoplasm of prostate: Secondary | ICD-10-CM

## 2012-05-25 NOTE — Progress Notes (Signed)
Karl Gentry here for follow up after 33 fractions to his prostate.  He denies pain and fatigue.  He did have urinary frequency and urgency after treatment but he states it has gone away now.  He denies hematuria, nocturia and diarrhea.

## 2012-05-25 NOTE — Progress Notes (Signed)
CC: Dr. Crecencio Mc, Dr. Dossie Arbour, PCP  Followup note: The patient returns today approximately 1 month following completion of "salvage" radiation therapy in the management of his PSA recurrent carcinoma the prostate. He is doing well from a GU and GI standpoint. He is back to his baseline habits. His PSA taken during his last week of radiation therapy had decreased to 0.12 from a pretreatment level of 0.23 on 01/21/2012. He plans to see Dr. Laverle Patter for a followup visit on August 14.  Physical examination: Alert and oriented.  Filed Vitals:   05/25/12 0902  BP: 153/77  Pulse: 71  Temp: 99 F (37.2 C)   Rectal examination not performed.  Impression: Satisfactory initial response to radiation therapy. He'll have a followup PSA in August when he sees Dr. Laverle Patter for a followup visit.  Plan: I've not scheduled the patient for a formal followup visit, and I ask that Dr. Laverle Patter keep me posted on his progress.

## 2013-04-30 ENCOUNTER — Ambulatory Visit: Payer: Self-pay

## 2014-09-30 ENCOUNTER — Ambulatory Visit: Payer: BLUE CROSS/BLUE SHIELD

## 2014-09-30 ENCOUNTER — Encounter: Payer: Self-pay | Admitting: Radiology

## 2014-09-30 ENCOUNTER — Ambulatory Visit
Admission: EM | Admit: 2014-09-30 | Discharge: 2014-09-30 | Disposition: A | Discharge status: Home / Self Care | Payer: BLUE CROSS/BLUE SHIELD | Attending: Family Medicine | Admitting: Family Medicine

## 2014-09-30 DIAGNOSIS — R05 Cough: Secondary | ICD-10-CM | POA: Diagnosis present

## 2014-09-30 DIAGNOSIS — J189 Pneumonia, unspecified organism: Secondary | ICD-10-CM

## 2014-09-30 MED ORDER — LEVOFLOXACIN 500 MG PO TABS: 500.0000 mg | ORAL_TABLET | Freq: Every day | ORAL | Status: DC

## 2014-09-30 MED ORDER — ALBUTEROL SULFATE HFA 108 (90 BASE) MCG/ACT IN AERS: 1.0000 | INHALATION_SPRAY | Freq: Four times a day (QID) | RESPIRATORY_TRACT | Status: DC | PRN

## 2014-09-30 NOTE — ED Provider Notes (Signed)
Patient presents today with symptoms of mild productive cough for the last 2 weeks. His temperature last night was 99. He denies any increased shortness of breath, leg swelling, chest pain, wheezing. He denies any history of smoking, increased alcohol use. He denies any history of asthma or COPD. He states that his cough is worse in the mornings. He denies any history of CHF. Has history of prostate cancer.  Review of systems negative except mentioned above. Vitals as noted per Epic.  OBJECTIVE: General: NAD HEENT: mild pharyngeal erythema, no exudate, no erythema of TMs, no cervical LAD Respiratory: mild rhonci on left, no wheezing appreciated, no accessory muscle use Cardiology: RRR Extrem: -Homans, no pitting edema appreciated  Neurological: CN II-XII grossly intact   ASSESSMENT: Left Sided Pneumonia   PLAN: Levaquin, Albuterol when necessary, Delsym when necessary, rest, hydration, repeat chest x-ray and 3-4 weeks for resolution, seek medical attention if symptoms persist or worsen. Work excuse given for 2 days. If any further problems with work, he is to follow-up with his primary care physician or here.       Paulina Fusi, MD 09/30/14 1007

## 2014-09-30 NOTE — Discharge Instructions (Signed)
Need repeat chest xray  in 4 weeks to see resolution of pneumonia. Can try Delysm for cough. Albuterol Inhaler as needed.

## 2014-09-30 NOTE — ED Notes (Signed)
Patient states that about 2 weeks ago he started with a cough and states that now he feels "rattling" when he breathes. He states that this has not improved with any OTC medications. States that he has been more tired than usual. He states that he has been having some production of mucus that is worse in the morning. He states that symptoms have been ongoing and constant.

## 2014-10-13 ENCOUNTER — Ambulatory Visit
Admission: EM | Admit: 2014-10-13 | Discharge: 2014-10-13 | Disposition: A | Discharge status: Home / Self Care | Payer: BLUE CROSS/BLUE SHIELD | Attending: Internal Medicine | Admitting: Internal Medicine

## 2014-10-13 DIAGNOSIS — H6983 Other specified disorders of Eustachian tube, bilateral: Secondary | ICD-10-CM

## 2014-10-13 DIAGNOSIS — R059 Cough, unspecified: Secondary | ICD-10-CM

## 2014-10-13 DIAGNOSIS — R05 Cough: Secondary | ICD-10-CM

## 2014-10-13 DIAGNOSIS — R0982 Postnasal drip: Secondary | ICD-10-CM

## 2014-10-13 MED ORDER — CETIRIZINE HCL 10 MG PO TABS: 10.0000 mg | ORAL_TABLET | Freq: Every day | ORAL | Status: DC

## 2014-10-13 MED ORDER — BENZONATATE 100 MG PO CAPS: 100.0000 mg | ORAL_CAPSULE | Freq: Three times a day (TID) | ORAL | Status: DC | PRN

## 2014-10-13 MED ORDER — IPRATROPIUM-ALBUTEROL 0.5-2.5 (3) MG/3ML IN SOLN
3.0000 mL | Freq: Once | RESPIRATORY_TRACT | Status: AC
Start: 2014-10-13 — End: 2014-10-13
  Administered 2014-10-13: 3 mL via RESPIRATORY_TRACT

## 2014-10-13 MED ORDER — HYDROCOD POLST-CPM POLST ER 10-8 MG/5ML PO SUER: 5.0000 mL | Freq: Every evening | ORAL | Status: DC | PRN

## 2014-10-13 MED ORDER — FLUTICASONE PROPIONATE 50 MCG/ACT NA SUSP: 2.0000 | Freq: Every day | NASAL | Status: DC

## 2014-10-13 NOTE — ED Provider Notes (Signed)
CSN: [id number]     Arrival date & time 10/13/14  1027 History   First MD Initiated Contact with Patient 10/13/14 1143     Chief Complaint  Patient presents with  . URI   (Consider location/radiation/quality/duration/timing/severity/associated sxs/prior Treatment) HPI  64 yo M returns to [facility]-treated 2 weeks ago for pneumonia- expressing frustration that his cough is still present. He feels much better overall. Has been afebrile. No longer feels rattling chest. Energy improved.  Having issues with post nasal drip and full feeling ears. 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 Symptoms increased after doing yard work yesterday,  Past Medical History  Diagnosis Date  . Hypertension   . Inguinal hernia bilateral, non-recurrent 04-13-11    states bilateral at present / umbilical hernia also  . Cancer 04-13-11    recent dx. prostate cancer, surgery planned  . Prostate cancer 04/23/11    bx=Adenocarcinoma,Gleason=4+3=7,PSA=5.5,volume  =36cc  . ED (erectile dysfunction)   . Stress incontinence, male   . Arthritis     right shoulder, no cartillage  . Radiation 03/02/2012-04/20/2012    prostate bed 6600 cGy   Past Surgical History  Procedure Laterality Date  . Hernia repair  04-13-11    '76 -open rt. right inguinal hernia repair/mesh  . Biopsy prostate  04-13-11    bx. 2'13 [facility]  . Robot assisted laparoscopic radical prostatectomy  04/23/2011    Procedure: ROBOTIC ASSISTED LAPAROSCOPIC RADICAL PROSTATECTOMY LEVEL 2;  Surgeon: Dutch Gray, MD;  Location: [facility];  Service: Urology;  Laterality: N/A;       Family History  Problem Relation Age of Onset  . Cancer Mother     breast age 22  . Cancer Father     colon age 18   Social History  Substance Use Topics  . Smoking status: Never Smoker   . Smokeless tobacco: Former Systems developer  . Alcohol Use: No    Review of Systems Review of 10 systems negative for acute change except as  referenced in HPI  Allergies  Review of patient's allergies indicates no known allergies.  Home Medications   Prior to Admission medications   Medication Sig Start Date End Date Taking? Authorizing Provider  albuterol (PROVENTIL HFA;VENTOLIN HFA) 108 (90 BASE) MCG/ACT inhaler Inhale 1-2 puffs into the lungs every 6 (six) hours as needed for wheezing or shortness of breath. 09/30/14   Paulina Fusi, MD  amLODipine (NORVASC) 10 MG tablet Take 10 mg by mouth daily at 12 noon.    Historical Provider, MD  aspirin 81 MG tablet Take 81 mg by mouth daily.    Historical Provider, MD  benzonatate (TESSALON) 100 MG capsule Take 1 capsule (100 mg total) by mouth 3 (three) times daily as needed for cough. 10/13/14   Jan Fireman, PA-C  cetirizine (ZYRTEC) 10 MG tablet Take 1 tablet (10 mg total) by mouth daily. 10/13/14   Jan Fireman, PA-C  chlorpheniramine-HYDROcodone [facility] PENNKINETIC ER) 10-8 MG/5ML SUER Take 5 mLs by mouth at bedtime as needed for cough. 10/13/14   Jan Fireman, PA-C  fish oil-omega-3 fatty acids 1000 MG capsule Take 2 g by mouth daily.    Historical Provider, MD  fluticasone (FLONASE) 50 MCG/ACT nasal spray Place 2 sprays into both nostrils daily. 10/13/14   Jan Fireman, PA-C  levofloxacin (LEVAQUIN) 500 MG tablet Take 1 tablet (500 mg total) by mouth daily. 09/30/14   Paulina Fusi, MD  Multiple Vitamin (MULTIVITAMIN) tablet  Take 1 tablet by mouth daily.    Historical Provider, MD  olmesartan (BENICAR) 20 MG tablet Take 20 mg by mouth daily at 12 noon.    Historical Provider, MD  tadalafil (CIALIS) 10 MG tablet Take 10 mg by mouth daily as needed.    Historical Provider, MD   Meds Ordered and Administered this Visit   Medications  ipratropium-albuterol (DUONEB) 0.5-2.5 (3) MG/3ML nebulizer solution 3 mL (3 mLs Nebulization Given 10/13/14 1215)  Felt improved  BP 164/72 mmHg  Pulse 83  Temp(Src) 97.5 F (36.4 C) (Tympanic)  Resp 20  Ht 5\' 8"  (1.727 m)  Wt 240 lb (108.863 kg)   BMI 36.50 kg/m2  SpO2 99% No data found.   Physical Exam    Constitutional -alert and oriented,well appearing and in no acute distress,afebrile Head-atraumatic, normocephalic Eyes- conjunctiva normal, EOMI ,conjugate gaze Nose- mild congestion Ears- neg bilat for erythema, often has pop/squeak,  Mouth/throat- mucous membranes moist ,oropharynx non-erythematous- post nasal drip,  Neck- supple without glandular enlargement CV- regular rate, grossly normal heart sounds,  Resp-no distress, normal respiratory effort,clear to auscultation bilaterally-continues to have intermittent cough, Sp02 99% GI- soft,non-tender,no distention GU-  not examined MSK- non tender, normal ROM, all extremities, ambulatory, no edema Neuro- normal speech and language, no gross focal neurological deficit appreciated, no gait instability, Skin-warm,dry ,intact; no rash noted Psych-mood and affect grossly normal; speech and behavior grossly normal  ED Course  Procedures (including critical care time)  Labs Review Labs Reviewed - No data to display  Imaging Review No results found.     Medications  ipratropium-albuterol (DUONEB) 0.5-2.5 (3) MG/3ML nebulizer solution 3 mL (3 mLs Nebulization Given 10/13/14 1215)  well tolerated  Recovery from pneumonia appears to be going well. Have discussed defer CXR repeat at this time- afebrile and improved. 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 Will treat for cough-especially assistance at bedtime Add seasonal allergy interventions and plan to observe. Consider mask during projects. As long as without malaise/fever/fatigue defer films and f/u with PCP in a few weeks- Do not delay care if symptom package increases, fever or malaise return  MDM   1. Cough     Diagnosis and treatment discussed. Medications as noted  below discussed . Questions fielded, expectations and recommendations reviewed.  Patient expresses understanding. Will return to [facility] with questions, concern or exacerbation.   Discharge Medication List as of 10/13/2014 12:25 PM    START taking these medications   Details  benzonatate (TESSALON) 100 MG capsule Take 1 capsule (100 mg total) by mouth 3 (three) times daily as needed for cough., Starting 10/13/2014, Until Discontinued, Normal    cetirizine (ZYRTEC) 10 MG tablet Take 1 tablet (10 mg total) by mouth daily., Starting 10/13/2014, Until Discontinued, Normal    chlorpheniramine-HYDROcodone (TUSSIONEX PENNKINETIC ER) 10-8 MG/5ML SUER Take 5 mLs by mouth at bedtime as needed for cough., Starting 10/13/2014, Until Discontinued, Print    fluticasone (FLONASE) 50 MCG/ACT nasal spray Place 2 sprays into both nostrils daily., Starting 10/13/2014, Until Discontinued, Normal          Jan Fireman, PA-C 10/17/14 [phone]

## 2014-10-13 NOTE — ED Notes (Signed)
Pt treated for pneumonia 2 weeks ago, finished ABX. Now feels like he is getting sick again. Dry cough.

## 2014-10-13 NOTE — Discharge Instructions (Signed)
Get shaped masks for yard work  Increase fluids - no dry lips !!  Other meds as per label instruction we reviewed  Allergic Rhinitis Allergic rhinitis is when the mucous membranes in the nose respond to allergens. Allergens are particles in the air that cause your body to have an allergic reaction. This causes you to release allergic antibodies. Through a chain of events, these eventually cause you to release histamine into the blood stream. Although meant to protect the body, it is this release of histamine that causes your discomfort, such as frequent sneezing, congestion, and an itchy, runny nose.  CAUSES  Seasonal allergic rhinitis (hay fever) is caused by pollen allergens that may come from grasses, trees, and weeds. Year-round allergic rhinitis (perennial allergic rhinitis) is caused by allergens such as house dust mites, pet dander, and mold spores.  SYMPTOMS   Nasal stuffiness (congestion).  Itchy, runny nose with sneezing and tearing of the eyes. DIAGNOSIS  Your health care provider can help you determine the allergen or allergens that trigger your symptoms. If you and your health care provider are unable to determine the allergen, skin or blood testing may be used. TREATMENT  Allergic rhinitis does not have a cure, but it can be controlled by:  Medicines and allergy shots (immunotherapy).  Avoiding the allergen. Hay fever may often be treated with antihistamines in pill or nasal spray forms. Antihistamines block the effects of histamine. There are over-the-counter medicines that may help with nasal congestion and swelling around the eyes. Check with your health care provider before taking or giving this medicine.  If avoiding the allergen or the medicine prescribed do not work, there are many new medicines your health care provider can prescribe. Stronger medicine may be used if initial measures are ineffective. Desensitizing injections can be used if medicine and avoidance does  not work. Desensitization is when a patient is given ongoing shots until the body becomes less sensitive to the allergen. Make sure you follow up with your health care provider if problems continue. HOME CARE INSTRUCTIONS It is not possible to completely avoid allergens, but you can reduce your symptoms by taking steps to limit your exposure to them. It helps to know exactly what you are allergic to so that you can avoid your specific triggers. SEEK MEDICAL CARE IF:   You have a fever.  You develop a cough that does not stop easily (persistent).  You have shortness of breath.  You start wheezing.  Symptoms interfere with normal daily activities. Document Released: 10/21/2000 Document Revised: 01/31/2013 Document Reviewed: 10/03/2012 [facility] Patient Information 2015 [place], [place]. This information is not intended to replace advice given to you by your health care provider. Make sure you discuss any questions you have with your health care provider. Barotitis Media Barotitis media is inflammation of your middle ear. This occurs when the auditory tube (eustachian tube) leading from the back of your nose (nasopharynx) to your eardrum is blocked. This blockage may result from a cold, environmental allergies, or an upper respiratory infection. Unresolved barotitis media may lead to damage or hearing loss (barotrauma), which may become permanent. HOME CARE INSTRUCTIONS   Use medicines as recommended by your health care provider. Over-the-counter medicines will help unblock the canal and can help during times of air travel.  Do not put anything into your ears to clean or unplug them. Eardrops will not be helpful.  Do not swim, dive, or fly until your health care provider says it is all right to  do so. If these activities are necessary, chewing gum with frequent, forceful swallowing may help. It is also helpful to hold your nose and gently blow to pop your ears for equalizing pressure changes. This  forces air into the eustachian tube.  Only take over-the-counter or prescription medicines for pain, discomfort, or fever as directed by your health care provider.  A decongestant may be helpful in decongesting the middle ear and make pressure equalization easier. SEEK MEDICAL CARE IF:  You experience a serious form of dizziness in which you feel as if the room is spinning and you feel nauseated (vertigo).  Your symptoms only involve one ear. SEEK IMMEDIATE MEDICAL CARE IF:   You develop a severe headache, dizziness, or severe ear pain.  You have bloody or pus-like drainage from your ears.  You develop a fever.  Your problems do not improve or become worse. MAKE SURE YOU:   Understand these instructions.  Will watch your condition.  Will get help right away if you are not doing well or get worse. Document Released: 01/24/2000 Document Revised: 11/16/2012 Document Reviewed: 08/23/2012 [facility] Patient Information 2015 [place], [place]. This information is not intended to replace advice given to you by your health care provider. Make sure you discuss any questions you have with your health care provider.

## 2014-10-17 ENCOUNTER — Encounter: Payer: Self-pay | Admitting: Physician Assistant

## 2017-10-14 ENCOUNTER — Other Ambulatory Visit (HOSPITAL_COMMUNITY): Payer: Self-pay | Admitting: Urology

## 2017-10-14 DIAGNOSIS — C61 Malignant neoplasm of prostate: Secondary | ICD-10-CM

## 2017-11-17 ENCOUNTER — Encounter (HOSPITAL_COMMUNITY)
Admission: RE | Admit: 2017-11-17 | Discharge: 2017-11-17 | Disposition: A | Discharge status: Home / Self Care | Payer: 59 | Source: Ambulatory Visit | Attending: Urology | Admitting: Urology

## 2017-11-17 DIAGNOSIS — C61 Malignant neoplasm of prostate: Secondary | ICD-10-CM | POA: Insufficient documentation

## 2017-11-17 MED ORDER — TECHNETIUM TC 99M MEDRONATE IV KIT
20.5000 | PACK | Freq: Once | INTRAVENOUS | Status: AC | PRN
  Administered 2017-11-17: 20.5 via INTRAVENOUS

## 2017-11-29 ENCOUNTER — Encounter (INDEPENDENT_AMBULATORY_CARE_PROVIDER_SITE_OTHER): Payer: 59 | Admitting: Ophthalmology

## 2017-12-03 NOTE — Progress Notes (Signed)
[facility] Note  12/06/2017     CHIEF COMPLAINT Patient presents for Retina Evaluation   HISTORY OF PRESENT ILLNESS: Karl Gentry is a 67 y.o. male who presents to the clinic today for:   HPI    Retina Evaluation    In both eyes.  This started 1 month ago.  Associated Symptoms Floaters.  Negative for Flashes, Blind Spot, Photophobia, Scalp Tenderness, Fever, Weight Loss, Jaw Claudication, Glare, Pain, Distortion, Redness, Trauma, Shoulder/Hip pain and Fatigue.  Context:  distance vision.  Treatments tried include no treatments.  I, the attending physician,  performed the HPI with the patient and updated documentation appropriately.          Comments    Referral of DR. Bernstorf for retina eval/poss. Hole OD. Patient states he has had occasional floaters as a child until now.Denies flashes ,light sensitivity, and ocular pain. 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 Pt denies gtt's/vit's        Last edited by Bernarda Caffey, MD on 12/06/2017  9:07 AM. (History)    pt states he is a pt of Dr. Shirley Muscat, he states he only went to see him to get safety glasses for work, he states he is not having any problems with his vision, pt endorses having HTN, pt states he has prostate cancer dx in 2011, pt states he has had sx and radiation  Referring physician: Calton Dach, MD [address], [place] [address]  HISTORICAL INFORMATION:   Selected notes from the [place] Referred by Dr. Thurston Hole for concern of retinal hole OD LEE: 10.20.19 (S. Bernstorf) [BCVA: OD: 20/20 OS: 20/20] Ocular Hx-glaucoma suspect OU, cataracts OU, arcus OU, pinguecula OU, conjunctivochalasis OU PMH-arthritis, cancer, HTN, asthma    CURRENT MEDICATIONS: No current outpatient medications on file. (Ophthalmic Drugs)   No current facility-administered medications for this visit.  (Ophthalmic  Drugs)   Current Outpatient Medications (Other)  Medication Sig  . albuterol (PROVENTIL HFA;VENTOLIN HFA) 108 (90 BASE) MCG/ACT inhaler Inhale 1-2 puffs into the lungs every 6 (six) hours as needed for wheezing or shortness of breath. (Patient not taking: Reported on 12/06/2017)  . amLODipine (NORVASC) 10 MG tablet Take 10 mg by mouth daily at 12 noon.  Marland Kitchen aspirin 81 MG tablet Take 81 mg by mouth daily.  . benzonatate (TESSALON) 100 MG capsule Take 1 capsule (100 mg total) by mouth 3 (three) times daily as needed for cough. (Patient not taking: Reported on 12/06/2017)  . cetirizine (ZYRTEC) 10 MG tablet Take 1 tablet (10 mg total) by mouth daily. (Patient not taking: Reported on 12/06/2017)  . chlorpheniramine-HYDROcodone (TUSSIONEX PENNKINETIC ER) 10-8 MG/5ML SUER Take 5 mLs by mouth at bedtime as needed for cough. (Patient not taking: Reported on 12/06/2017)  . fish oil-omega-3 fatty acids 1000 MG capsule Take 2 g by mouth daily.  . fluticasone (FLONASE) 50 MCG/ACT nasal spray Place 2 sprays into both nostrils daily. (Patient not taking: Reported on 12/06/2017)  . levofloxacin (LEVAQUIN) 500 MG tablet Take 1 tablet (500 mg total) by mouth daily. (Patient not taking: Reported on 12/06/2017)  . Multiple Vitamin (MULTIVITAMIN) tablet Take 1 tablet by mouth daily.  Marland Kitchen olmesartan (BENICAR) 20 MG tablet Take 20 mg by mouth daily at 12 noon.  . tadalafil (CIALIS) 10 MG tablet Take 10 mg by mouth daily as needed.   No current facility-administered medications for  this visit.  (Other)      REVIEW OF SYSTEMS: ROS    Positive for: Eyes   Negative for: Constitutional, Gastrointestinal, Neurological, Skin, Genitourinary, Musculoskeletal, HENT, Endocrine, Cardiovascular, Respiratory, Psychiatric, Allergic/Imm, Heme/Lymph   Last edited by Zenovia Jordan, LPN on 09/98/3382  5:05 AM. (History)       ALLERGIES No Known Allergies  PAST MEDICAL HISTORY Past Medical History:  Diagnosis Date  .  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 Cancer [facility]) 04-13-11   recent dx. prostate cancer, surgery planned  . ED (erectile dysfunction)   . Hypertension   . Inguinal hernia bilateral, non-recurrent 04-13-11   states bilateral at present / umbilical hernia also  . Prostate cancer ([facility]) 04/23/11   bx=Adenocarcinoma,Gleason=4+3=7,PSA=5.5,volume  =36cc  . Radiation 03/02/2012-04/20/2012   prostate bed 6600 cGy  . Stress incontinence, male    Past Surgical History:  Procedure Laterality Date  . BIOPSY PROSTATE  04-13-11   bx. 2'13 [place] [facility]  . HERNIA REPAIR  04-13-11   '76 -open rt. right inguinal hernia repair/mesh  . ROBOT ASSISTED LAPAROSCOPIC RADICAL PROSTATECTOMY  04/23/2011   Procedure: ROBOTIC ASSISTED LAPAROSCOPIC RADICAL PROSTATECTOMY LEVEL 2;  Surgeon: Dutch Gray, MD;  Location: [facility];  Service: Urology;  Laterality: N/A;        FAMILY HISTORY Family History  Problem Relation Age of Onset  . Cancer Mother        breast age 49  . Cancer Father        colon age 43    SOCIAL HISTORY Social History   Tobacco Use  . Smoking status: Never Smoker  . Smokeless tobacco: Former Network engineer Use Topics  . Alcohol use: No  . Drug use: No         OPHTHALMIC EXAM:  Base Eye Exam    Visual Acuity (Snellen - Linear)      Right Left   Dist cc 20/20 20/20   Dist ph cc NI NI       Tonometry (Tonopen, 8:35 AM)      Right Left   Pressure 12 14       Pupils      Dark Light Shape React APD   Right 4 3 Round Brisk None   Left 4 3 Round Brisk None       Visual Fields (Counting fingers)      Left Right    Full Full       Extraocular Movement      Right Left    Full, Ortho Full, Ortho       Neuro/Psych    Oriented x3:  Yes       Dilation    Both eyes:  1.0% Mydriacyl, 2.5% Phenylephrine @ 8:35 AM        Slit Lamp and Fundus Exam    Slit Lamp Exam      Right Left   Lids/Lashes Dermatochalasis - upper lid, Meibomian gland dysfunction  Dermatochalasis - upper lid, Meibomian gland dysfunction   Conjunctiva/Sclera White and quiet White and quiet   Cornea mild Arcus, 1+ Punctate epithelial erosions mild Arcus, 1+ Punctate epithelial erosions   Anterior Chamber Deep and clear Deep and clear   Iris Round and dilated Round and dilated   Lens 2+ Cortical cataract, 2+ Nuclear sclerosis 2+ Cortical cataract, 2+ Nuclear sclerosis   Vitreous mild Vitreous syneresis, Posterior vitreous detachment mild Vitreous syneresis       Fundus Exam  Right Left   Disc Pink and Sharp Pink and Sharp   C/D Ratio 0.5 0.5   Macula Blunted foveal reflex, Retinal pigment epithelial mottling, No heme or edema Blunted foveal reflex, Retinal pigment epithelial mottling, No heme or edema   Vessels mild Vascular attenuation, mild AV crossing changes mild Vascular attenuation, mild AV crossing changes   Periphery Attached, round area of chorioretinal atrophy with surrounding pigment at 0730 equator, scattered reticular degneration Attache, very mild reticular degeneration        Refraction    Wearing Rx      Sphere Cylinder Axis Add   Right +1.25 +0.50 165 +2.50   Left +1.00 +0.75 002 +2.50          IMAGING AND PROCEDURES  Imaging and Procedures for @TODAY @  OCT, Retina - OU - Both Eyes       Right Eye Quality was good. Central Foveal Thickness: 275. Progression has no prior data. Findings include normal foveal contour, no IRF, no SRF.   Left Eye Quality was good. Central Foveal Thickness: 281. Progression has no prior data. Findings include normal foveal contour, no SRF, no IRF.   Notes *Images captured and stored on drive  Diagnosis / Impression:  NFP, No SRF/IRF OU  Clinical management:  See below  Abbreviations: NFP - Normal foveal profile. CME - cystoid macular edema. PED - pigment epithelial detachment. IRF - intraretinal fluid. SRF - subretinal fluid. EZ - ellipsoid zone. ERM - epiretinal membrane. ORA - outer retinal  atrophy. ORT - outer retinal tubulation. 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 Retinal edema H35.81 OCT, Retina - OU - Both Eyes  3. Essential hypertension I10   4. Hypertensive retinopathy of both eyes H35.033   5. Combined forms of age-related cataract of both eyes H25.813   6. 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 Chorioretinal atrophy OD - single round area of CR atrophy / cobblestoning at 0730 OD - no retinal break or SRF - no intervention indicated - okay to monitor  2. No retinal edema on exam or OCT  3,4. Hypertensive retinopathy OU - discussed importance of tight BP control - monitor  5. Mixed cataract - The symptoms of cataract, surgical options, and treatments and risks were discussed with patient. - discussed diagnosis and progression - not yet visually significant - monitor for now  6. Hyperopia w/ astigmatism and presbyopia - VA 20/20 OU with current spectacles - management per Dr. Shirley Muscat  Ophthalmic Meds Ordered this visit:  No orders of the defined types were placed in this encounter.      Return if symptoms worsen or fail to improve.  There are no Patient Instructions on file for this visit.   Explained the diagnoses, plan, and follow up with the patient and they expressed understanding.  Patient expressed understanding of the importance of proper follow up care.   This document serves as a record of services personally performed by Gardiner Sleeper, MD, PhD. It was created on their behalf by Ernest Mallick, OA, an ophthalmic assistant. The creation of this record is the provider's dictation and/or activities during the visit.    Electronically signed by: Ernest Mallick, OA  10.25.19 1:04 PM  Gardiner Sleeper, M.D., Ph.D. Diseases & Surgery of the Retina and Vitreous [facility]   I have reviewed the above documentation for accuracy and completeness, and I agree with the above. Gardiner Sleeper, M.D., Ph.D. 12/06/17 1:07 PM   Abbreviations: M myopia (nearsighted); A astigmatism; H hyperopia (farsighted); P presbyopia; Mrx spectacle prescription;  CTL contact lenses; OD right eye; OS left eye; OU both eyes  XT exotropia; ET esotropia; PEK punctate epithelial keratitis; PEE punctate epithelial erosions; DES dry eye syndrome; MGD meibomian gland dysfunction; ATs artificial tears; PFAT's preservative free artificial tears; [place] nuclear sclerotic cataract; PSC posterior subcapsular cataract; ERM epi-retinal membrane; PVD posterior vitreous detachment; RD retinal detachment; DM diabetes mellitus; DR diabetic retinopathy; NPDR non-proliferative diabetic retinopathy; PDR proliferative diabetic retinopathy; CSME clinically significant macular edema; DME diabetic macular edema; dbh dot blot hemorrhages; CWS cotton wool spot; POAG primary open angle glaucoma; C/D cup-to-disc ratio; HVF humphrey visual field; GVF goldmann visual field; OCT optical coherence tomography; IOP intraocular pressure; BRVO Branch retinal vein occlusion; CRVO central retinal vein occlusion; CRAO central retinal artery occlusion; BRAO branch retinal artery occlusion; RT retinal tear; SB scleral buckle; PPV pars plana vitrectomy; VH Vitreous hemorrhage; PRP panretinal laser photocoagulation; IVK intravitreal kenalog; VMT vitreomacular traction; MH Macular hole;  NVD neovascularization of the disc; NVE neovascularization elsewhere; AREDS age related eye disease study; ARMD age related macular degeneration; POAG primary open angle glaucoma; EBMD epithelial/anterior basement membrane dystrophy; ACIOL anterior chamber intraocular lens; IOL intraocular lens; PCIOL posterior chamber intraocular lens; Phaco/IOL phacoemulsification with intraocular lens placement; [place] photorefractive keratectomy; LASIK laser  assisted in situ keratomileusis; HTN hypertension; DM diabetes mellitus; COPD chronic obstructive pulmonary disease

## 2017-12-06 ENCOUNTER — Encounter (INDEPENDENT_AMBULATORY_CARE_PROVIDER_SITE_OTHER): Payer: Self-pay | Admitting: Ophthalmology

## 2017-12-06 ENCOUNTER — Ambulatory Visit (INDEPENDENT_AMBULATORY_CARE_PROVIDER_SITE_OTHER): Payer: 59 | Admitting: Ophthalmology

## 2017-12-06 DIAGNOSIS — H3581 Retinal edema: Secondary | ICD-10-CM

## 2017-12-06 DIAGNOSIS — I10 Essential (primary) hypertension: Secondary | ICD-10-CM

## 2017-12-06 DIAGNOSIS — H31101 Choroidal degeneration, unspecified, right eye: Secondary | ICD-10-CM | POA: Diagnosis not present

## 2017-12-06 DIAGNOSIS — H524 Presbyopia: Secondary | ICD-10-CM

## 2017-12-06 DIAGNOSIS — H5203 Hypermetropia, bilateral: Secondary | ICD-10-CM

## 2017-12-06 DIAGNOSIS — H35033 Hypertensive retinopathy, bilateral: Secondary | ICD-10-CM

## 2017-12-06 DIAGNOSIS — H25813 Combined forms of age-related cataract, bilateral: Secondary | ICD-10-CM

## 2017-12-06 DIAGNOSIS — H52203 Unspecified astigmatism, bilateral: Secondary | ICD-10-CM

## 2018-05-04 ENCOUNTER — Ambulatory Visit: Payer: Self-pay | Admitting: Internal Medicine

## 2018-06-19 ENCOUNTER — Other Ambulatory Visit: Payer: Self-pay | Admitting: Internal Medicine

## 2018-06-21 ENCOUNTER — Other Ambulatory Visit: Payer: Self-pay

## 2018-06-21 ENCOUNTER — Ambulatory Visit (INDEPENDENT_AMBULATORY_CARE_PROVIDER_SITE_OTHER): Payer: 59 | Admitting: Internal Medicine

## 2018-06-21 ENCOUNTER — Encounter: Payer: Self-pay | Admitting: Internal Medicine

## 2018-06-21 VITALS — BP 156/92 | HR 87 | Ht 68.0 in | Wt 257.0 lb

## 2018-06-21 DIAGNOSIS — K439 Ventral hernia without obstruction or gangrene: Secondary | ICD-10-CM | POA: Insufficient documentation

## 2018-06-21 DIAGNOSIS — I10 Essential (primary) hypertension: Secondary | ICD-10-CM | POA: Diagnosis not present

## 2018-06-21 DIAGNOSIS — I499 Cardiac arrhythmia, unspecified: Secondary | ICD-10-CM

## 2018-06-21 DIAGNOSIS — Z23 Encounter for immunization: Secondary | ICD-10-CM | POA: Diagnosis not present

## 2018-06-21 HISTORY — DX: Ventral hernia without obstruction or gangrene: K43.9

## 2018-06-21 MED ORDER — OLMESARTAN MEDOXOMIL 20 MG PO TABS: 20.0000 mg | ORAL_TABLET | Freq: Every day | ORAL | 3 refills | Status: DC

## 2018-06-21 NOTE — Progress Notes (Signed)
Date:  06/21/2018   Name:  Karl Gentry   DOB:  28-Jun-1950   MRN:  [id number]   Chief Complaint: Establish Care and Hernia (Requesting referal for gen surgery for his hernia. Hernia came back larger after prostate surgery )  Hypertension  This is a chronic problem. The problem is unchanged. Associated symptoms include palpitations. Pertinent negatives include no chest pain, headaches or shortness of breath. There are no associated agents to hypertension. There are no known risk factors for coronary artery disease. Past treatments include angiotensin blockers. The current treatment provides significant improvement.   Hernia - hx of surgical repair of the right inguinal hernia in 1977.  Now he feels that he has a ventral hernia that is enlarging.  He was told at the time of his prostate surgery that he had a hernia (2013).  He does not have severe abdominal pain, constipation, nausea or vomiting.  Review of Systems  Constitutional: Negative for chills, fatigue, fever and unexpected weight change.  Respiratory: Negative for chest tightness, shortness of breath and wheezing.   Cardiovascular: Positive for palpitations. Negative for chest pain and leg swelling.  Gastrointestinal: Positive for abdominal distention. Negative for constipation.  Neurological: Negative for dizziness and headaches.  Psychiatric/Behavioral: Negative for decreased concentration, dysphoric mood and sleep disturbance.    Patient Active Problem List   Diagnosis Date Noted  . Essential hypertension 06/21/2018  . Ventral hernia without obstruction or gangrene 06/21/2018  . Prostate cancer ([place]) 04/23/2011    No Known Allergies  Past Surgical History:  Procedure Laterality Date  . BIOPSY PROSTATE  2013   bx. 2'13 [facility]  . HERNIA REPAIR  1977    -open rt. right inguinal hernia repair/mesh  . ROBOT ASSISTED LAPAROSCOPIC RADICAL PROSTATECTOMY  04/23/2011   Procedure: ROBOTIC ASSISTED  LAPAROSCOPIC RADICAL PROSTATECTOMY LEVEL 2;  Surgeon: Dutch Gray, MD;  Location: [facility];  Service: Urology;  Laterality: N/A;        Social History   Tobacco Use  . Smoking status: Former Smoker    Packs/day: 1.00    Years: 10.00    Pack years: 10.00    Types: Cigarettes    Last attempt to quit: 1970    Years since quitting: 50.3  . Smokeless tobacco: Former Network engineer Use Topics  . Alcohol use: No  . Drug use: No     Medication list has been reviewed and updated.  No outpatient medications have been marked as taking for the 06/21/18 encounter (Office Visit) with Glean Hess, MD.    [facility] 2/9 Scores 06/21/2018  PHQ - 2 Score 0    BP Readings from Last 3 Encounters:  06/21/18 134/80  10/13/14 (!) 164/72  09/30/14 (!) 160/81    Physical Exam Vitals signs and nursing note reviewed.  Constitutional:      General: He is not in acute distress.    Appearance: He is well-developed.  HENT:     Head: Normocephalic and atraumatic.  Neck:     Musculoskeletal: Normal range of motion and neck supple.  Cardiovascular:     Rate and Rhythm: Normal rate and regular rhythm. Frequent extrasystoles are present. Pulmonary:     Effort: Pulmonary effort is normal. No respiratory distress.     Breath sounds: No wheezing or rhonchi.  Abdominal:     General: There is distension.     Hernia: A hernia is present. Hernia is present in the ventral area.     Comments: Huge  ventral hernia - partial reducible with minimal discomfort  Musculoskeletal: Normal range of motion.  Skin:    General: Skin is warm and dry.     Findings: No rash.  Neurological:     Mental Status: He is alert and oriented to person, place, and time.  Psychiatric:        Behavior: Behavior normal.        Thought Content: Thought content normal.     Wt Readings from Last 3 Encounters:  06/21/18 257 lb (116.6 kg)  10/13/14 240 lb (108.9 kg)  09/30/14 240 lb (108.9 kg)    BP 134/80   Pulse 87   Ht 5'  8" (1.727 m)   Wt 257 lb (116.6 kg)   SpO2 97%   BMI 39.08 kg/m   Assessment and Plan: 1. Cardiac arrhythmia, unspecified cardiac arrhythmia type Frequent PVCs - EKG 12-Lead = NSR @ 83; WNL - TSH  2. Ventral hernia without obstruction or gangrene - Ambulatory referral to General Surgery  3. Essential hypertension Begin benicar, check labs - olmesartan (BENICAR) 20 MG tablet; Take 1 tablet (20 mg total) by mouth daily.  Dispense: 30 tablet; Refill: 3 - CBC with Differential/Platelet - Comprehensive metabolic panel  4. Need for vaccination for pneumococcus - Pneumococcal conjugate vaccine 13-valent IM   Partially dictated using Editor, commissioning. Any errors are unintentional.  Halina Maidens, MD [facility]  06/21/2018

## 2018-06-21 NOTE — Patient Instructions (Signed)
Pneumococcal Conjugate Vaccine (PCV13): What You Need to Know  1. Why get vaccinated?  Vaccination can protect both children and adults from pneumococcal disease.  Pneumococcal disease is caused by bacteria that can spread from person to person through close contact. It can cause ear infections, and it can also lead to more serious infections of the:  · Lungs (pneumonia),  · Blood (bacteremia), and  · Covering of the brain and spinal cord (meningitis).  Pneumococcal pneumonia is most common among adults. Pneumococcal meningitis can cause deafness and brain damage, and it kills about 1 child in 10 who get it.  Anyone can get pneumococcal disease, but children under 2 years of age and adults 65 years and older, people with certain medical conditions, and cigarette smokers are at the highest risk.  Before there was a vaccine, the [place] saw:  · more than 700 cases of meningitis,  · about 13,000 blood infections,  · about 5 million ear infections, and  · about 200 deaths  in children under 5 each year from pneumococcal disease. Since vaccine became available, severe pneumococcal disease in these children has fallen by 88%.  About 18,000 older adults die of pneumococcal disease each year in the [place].  Treatment of pneumococcal infections with penicillin and other drugs is not as effective as it used to be, because some strains of the disease have become resistant to these drugs. This makes prevention of the disease, through vaccination, even more important.  2. PCV13 vaccine  Pneumococcal conjugate vaccine (called PCV13) protects against 13 types of pneumococcal bacteria.  PCV13 is routinely given to children at 2, 4, 6, and 12-15 months of age. It is also recommended for children and adults 2 to 64 years of age with certain health conditions, and for all adults 65 years of age and older. Your doctor can give you details.  3. Some people should not get this vaccine  Anyone who has ever had a  life-threatening allergic reaction to a dose of this vaccine, to an earlier pneumococcal vaccine called PCV7, or to any vaccine containing diphtheria toxoid (for example, DTaP), should not get PCV13.  Anyone with a severe allergy to any component of PCV13 should not get the vaccine. Tell your doctor if the person being vaccinated has any severe allergies.  If the person scheduled for vaccination is not feeling well, your healthcare provider might decide to reschedule the shot on another day.  4. Risks of a vaccine reaction  With any medicine, including vaccines, there is a chance of reactions. These are usually mild and go away on their own, but serious reactions are also possible.  Problems reported following PCV13 varied by age and dose in the series. The most common problems reported among children were:  · About half became drowsy after the shot, had a temporary loss of appetite, or had redness or tenderness where the shot was given.  · About 1 out of 3 had swelling where the shot was given.  · About 1 out of 3 had a mild fever, and about 1 in 20 had a fever over 102.2°F.  · Up to about 8 out of 10 became fussy or irritable.  Adults have reported pain, redness, and swelling where the shot was given; also mild fever, fatigue, headache, chills, or muscle pain.  Young children who get PCV13 along with inactivated flu vaccine at the same time may be at increased risk for seizures caused by fever. Ask your doctor for more   information.  Problems that could happen after any vaccine:  · People sometimes faint after a medical procedure, including vaccination. Sitting or lying down for about 15 minutes can help prevent fainting, and injuries caused by a fall. Tell your doctor if you feel dizzy, or have vision changes or ringing in the ears.  · Some older children and adults get severe pain in the shoulder and have difficulty moving the arm where a shot was given. This happens very rarely.  · Any medication can cause a  severe allergic reaction. Such reactions from a vaccine are very rare, estimated at about 1 in a million doses, and would happen within a few minutes to a few hours after the vaccination.  As with any medicine, there is a very small chance of a vaccine causing a serious injury or death.  The safety of vaccines is always being monitored. For more information, visit: www.cdc.gov/vaccinesafety/  5. What if there is a serious reaction?  What should I look for?  · Look for anything that concerns you, such as signs of a severe allergic reaction, very high fever, or unusual behavior.  Signs of a severe allergic reaction can include hives, swelling of the face and throat, difficulty breathing, a fast heartbeat, dizziness, and weakness--usually within a few minutes to a few hours after the vaccination.  What should I do?  · If you think it is a severe allergic reaction or other emergency that can't wait, call 9-1-1 or get the person to the nearest hospital. Otherwise, call your doctor.  Reactions should be reported to the Vaccine Adverse Event Reporting System (VAERS). Your doctor should file this report, or you can do it yourself through the VAERS web site at www.vaers.hhs.gov, or by calling 1-800-822-7967.  VAERS does not give medical advice.  6. The National Vaccine Injury Compensation Program  The National Vaccine Injury Compensation Program (VICP) is a federal program that was created to compensate people who may have been injured by certain vaccines.  Persons who believe they may have been injured by a vaccine can learn about the program and about filing a claim by calling 1-800-338-2382 or visiting the VICP website at www.hrsa.gov/vaccinecompensation. There is a time limit to file a claim for compensation.  7. How can I learn more?  · Ask your healthcare provider. He or she can give you the vaccine package insert or suggest other sources of information.  · Call your local or state health department.  · Contact the  Centers for Disease Control and Prevention (CDC):  ? Call 1-800-232-4636 (1-800-CDC-INFO) or  ? Visit CDC's website at www.cdc.gov/vaccines  Vaccine Information Statement PCV13 Vaccine (12/14/2013)  This information is not intended to replace advice given to you by your health care provider. Make sure you discuss any questions you have with your health care provider.  Document Released: 11/23/2005 Document Revised: 09/07/2017 Document Reviewed: 09/07/2017  Elsevier Interactive Patient Education © 2019 Elsevier Inc.

## 2018-06-22 LAB — COMPREHENSIVE METABOLIC PANEL
ALT: 34 IU/L (ref 0–44)
AST: 23 IU/L (ref 0–40)
Albumin/Globulin Ratio: 1.8 (ref 1.2–2.2)
Albumin: 4.5 g/dL (ref 3.8–4.8)
Alkaline Phosphatase: 57 IU/L (ref 39–117)
BUN/Creatinine Ratio: 21 (ref 10–24)
BUN: 18 mg/dL (ref 8–27)
Bilirubin Total: 0.3 mg/dL (ref 0.0–1.2)
CO2: 22 mmol/L (ref 20–29)
Calcium: 9.9 mg/dL (ref 8.6–10.2)
Chloride: 102 mmol/L (ref 96–106)
Creatinine, Ser: 0.87 mg/dL (ref 0.76–1.27)
GFR calc Af Amer: 103 mL/min/{1.73_m2} (ref >59)
GFR calc non Af Amer: 89 mL/min/{1.73_m2} (ref >59)
Globulin, Total: 2.5 g/dL (ref 1.5–4.5)
Glucose: 94 mg/dL (ref 65–99)
Potassium: 4.1 mmol/L (ref 3.5–5.2)
Sodium: 136 mmol/L (ref 134–144)
Total Protein: 7 g/dL (ref 6.0–8.5)

## 2018-06-22 LAB — CBC WITH DIFFERENTIAL/PLATELET
Basophils Absolute: 0.1 10*3/uL (ref 0.0–0.2)
Basos: 1 %
EOS (ABSOLUTE): 0.2 10*3/uL (ref 0.0–0.4)
Eos: 3 %
Hematocrit: 43.1 % (ref 37.5–51.0)
Hemoglobin: 15.1 g/dL (ref 13.0–17.7)
Immature Grans (Abs): 0 10*3/uL (ref 0.0–0.1)
Immature Granulocytes: 0 %
Lymphocytes Absolute: 1 10*3/uL (ref 0.7–3.1)
Lymphs: 14 %
MCH: 30 pg (ref 26.6–33.0)
MCHC: 35 g/dL (ref 31.5–35.7)
MCV: 86 fL (ref 79–97)
Monocytes Absolute: 0.7 10*3/uL (ref 0.1–0.9)
Monocytes: 11 %
Neutrophils Absolute: 4.9 10*3/uL (ref 1.4–7.0)
Neutrophils: 71 %
Platelets: 183 10*3/uL (ref 150–450)
RBC: 5.03 x10E6/uL (ref 4.14–5.80)
RDW: 12.9 % (ref 11.6–15.4)
WBC: 6.9 10*3/uL (ref 3.4–10.8)

## 2018-06-22 LAB — TSH: TSH: 2.39 u[IU]/mL (ref 0.450–4.500)

## 2018-07-18 ENCOUNTER — Ambulatory Visit: Payer: 59 | Admitting: General Surgery

## 2018-07-20 ENCOUNTER — Encounter: Payer: Self-pay | Admitting: *Deleted

## 2018-07-20 ENCOUNTER — Encounter: Payer: Self-pay | Admitting: General Surgery

## 2018-07-20 ENCOUNTER — Other Ambulatory Visit: Payer: Self-pay

## 2018-07-20 ENCOUNTER — Ambulatory Visit: Payer: 59 | Admitting: General Surgery

## 2018-07-20 VITALS — BP 158/92 | HR 84 | Temp 97.9°F | Resp 14 | Ht 68.0 in | Wt 257.8 lb

## 2018-07-20 DIAGNOSIS — K439 Ventral hernia without obstruction or gangrene: Secondary | ICD-10-CM | POA: Diagnosis not present

## 2018-07-20 NOTE — Patient Instructions (Signed)
The patient is aware to call back for any questions or new concerns.  Schedule CT scan abdomen and pelvis with contrast

## 2018-07-20 NOTE — Progress Notes (Signed)
Patient has been scheduled for a CT abdomen/pelvis with contrast at [facility] for 07-26-18 at 9:30 am (arrive 9:15 am). Prep: NPO 4 hours prior and pick up prep kit. Patient verbalizes understanding.  The patient will be contacted with results once Dr. Celine Ahr reviews.  Patient verbalizes understanding of the above.

## 2018-07-20 NOTE — Progress Notes (Signed)
Patient ID: Karl Gentry, male   DOB: Jan 21, 1951, 68 y.o.   MRN: [id number]  Chief Complaint  Patient presents with  . New Patient (Initial Visit)    new pt ref Dr.Berglund ventral hernia     HPI Karl Gentry is a 68 y.o. male.   He was referred by Dr. Army Melia for evaluation of a ventral hernia.  He states that he believes he has "several hernias."  He thinks perhaps they were there, but quite small in the past.  He feels like they did not start growing until after he had his prostate surgery in 2013.  His primary complaint is that "they are just huge," he also endorses pain and nausea.  He has never had any symptoms of bowel obstruction.  He says that rest relieves the pain, while activity exacerbates it.  He is currently wearing an abdominal binder for support.  He says that the mass always reduces and has never gotten hard, red, or swollen.  Aside from the robot prostatectomy in 2013, he has had a right inguinal hernia repair, done in the 1970s.   Past Medical History:  Diagnosis Date  . 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 Cancer [facility]) 04-13-11   recent dx. prostate cancer, surgery planned  . ED (erectile dysfunction)   . Hypertension   . Inguinal hernia bilateral, non-recurrent 04-13-11   states bilateral at present / umbilical hernia also  . Prostate cancer ([place]) 04/23/11   bx=Adenocarcinoma,Gleason=4+3=7,PSA=5.5,volume  =36cc  . Radiation 03/02/2012-04/20/2012   prostate bed 6600 cGy  . Stress incontinence, male     Past Surgical History:  Procedure Laterality Date  . BIOPSY PROSTATE  2013   bx. 2'13 [facility]  . HERNIA REPAIR  1977    -open rt. right inguinal hernia repair/mesh  . ROBOT ASSISTED LAPAROSCOPIC RADICAL PROSTATECTOMY  04/23/2011   Procedure: ROBOTIC ASSISTED LAPAROSCOPIC RADICAL PROSTATECTOMY LEVEL 2;  Surgeon: Dutch Gray, MD;  Location: [facility];  Service: Urology;  Laterality: N/A;        Family History  Problem Relation Age  of Onset  . Cancer Mother        breast age 57  . Cancer Father        colon age 56    Social History Social History   Tobacco Use  . Smoking status: Former Smoker    Years: 10.00    Types: Pipe    Last attempt to quit: 1970    Years since quitting: 50.4  . Smokeless tobacco: Former Systems developer    Types: Chew  Substance Use Topics  . Alcohol use: No  . Drug use: No    No Known Allergies  Current Outpatient Medications  Medication Sig Dispense Refill  . olmesartan (BENICAR) 20 MG tablet Take 1 tablet (20 mg total) by mouth daily. 30 tablet 3   No current facility-administered medications for this visit.     Review of Systems Review of Systems  Gastrointestinal: Positive for abdominal pain and nausea.  Musculoskeletal: Positive for arthralgias and back pain.  All other systems reviewed and are negative.   Blood pressure (!) 158/92, pulse 84, temperature 97.9 F (36.6 C), temperature source Temporal, resp. rate 14, height 5\' 8"  (1.727 m), weight 257 lb 12.8 oz (116.9 kg), SpO2 98 %.  Physical Exam Physical Exam Vitals signs reviewed. Exam conducted with a chaperone present.  Constitutional:      General: He is not in acute distress.    Appearance:  He is obese.  HENT:     Head: Normocephalic and atraumatic.     Nose:     Comments: Covered with a mask secondary to COVID-19 precautions    Mouth/Throat:     Comments: Covered with a mask secondary to COVID-19 precautions Eyes:     General: No scleral icterus.       Right eye: No discharge.        Left eye: No discharge.     Pupils: Pupils are equal, round, and reactive to light.  Neck:     Musculoskeletal: Normal range of motion and neck supple.  Cardiovascular:     Rate and Rhythm: Normal rate and regular rhythm.     Pulses: Normal pulses.     Heart sounds: No murmur.  Pulmonary:     Effort: Pulmonary effort is normal.     Breath sounds: Normal breath sounds.  Abdominal:     General: Abdomen is protuberant.  Bowel sounds are normal.     Palpations: Abdomen is soft.     Hernia: A hernia is present.       Comments: Diastasis rectus is present.  There is a complex ventral hernia in the periumbilical area.  The fascial defect is palpable to the right of the umbilicus.  There are overlying skin changes in the umbilical area.  The hernia reduces easily.  Genitourinary:    Comments: Deferred Musculoskeletal:     Right lower leg: No edema.     Left lower leg: No edema.     Comments: Decreased range of motion in bilateral shoulders  Lymphadenopathy:     Cervical: No cervical adenopathy.  Skin:    General: Skin is warm and dry.  Neurological:     General: No focal deficit present.     Mental Status: He is alert.  Psychiatric:        Mood and Affect: Mood normal.        Behavior: Behavior normal.        Thought Content: Thought content normal.     Data Reviewed There are no relevant data available for review.  Assessment This is a 68 year old obese Caucasian male with a complex ventral hernia.  He has not exhibited any signs concerning for incarceration or strangulation.  He does have significant discomfort and there are overlying skin changes.  I have recommended that he undergo surgical repair.  Plan For surgical planning purposes, we will obtain a CT scan of the abdomen and pelvis.  Once these images are available, I will discuss and review them with 1 of my partners, who has more experience in complex ventral hernia repair.  I discussed with Mr. Richeson that his obesity makes it more likely that his hernia may recur.  We also discussed the use of mesh and the potential complications related thereto.  He is eager to have some relief from his discomfort.  We will make surgical arrangements as soon as I have the necessary information.  I will contact him once the CT results are back and we will schedule from there.    Fredirick Maudlin 07/20/2018, 9:59 AM

## 2018-07-26 ENCOUNTER — Other Ambulatory Visit: Payer: Self-pay

## 2018-07-26 ENCOUNTER — Ambulatory Visit
Admission: RE | Admit: 2018-07-26 | Discharge: 2018-07-26 | Disposition: A | Discharge status: Home / Self Care | Payer: 59 | Source: Ambulatory Visit | Attending: General Surgery | Admitting: General Surgery

## 2018-07-26 ENCOUNTER — Encounter (INDEPENDENT_AMBULATORY_CARE_PROVIDER_SITE_OTHER): Payer: Self-pay

## 2018-07-26 DIAGNOSIS — K439 Ventral hernia without obstruction or gangrene: Secondary | ICD-10-CM | POA: Insufficient documentation

## 2018-07-26 MED ORDER — IOHEXOL 300 MG/ML  SOLN
100.0000 mL | Freq: Once | INTRAMUSCULAR | Status: AC | PRN
  Administered 2018-07-26: 100 mL via INTRAVENOUS

## 2018-07-27 ENCOUNTER — Telehealth: Payer: Self-pay | Admitting: General Surgery

## 2018-07-27 NOTE — Telephone Encounter (Signed)
I discussed the results of his CT scan.  This shows a fairly complicated hernia picture with bilateral inguinal hernias as well as a complex ventral hernia.  Due to the complexity of the picture, I asked 1 of my colleagues to have a look.  Dr. Dahlia Byes feels like this will require an extensive domino wall reconstruction.  I personally am not comfortable performing this operation.  We will have Karl Gentry follow-up with Dr. Dahlia Byes to establish care and schedule surgery.  I will assist Dr. Dahlia Byes with the operation.

## 2018-08-10 ENCOUNTER — Encounter: Payer: Self-pay | Admitting: Surgery

## 2018-08-10 ENCOUNTER — Other Ambulatory Visit: Payer: Self-pay

## 2018-08-10 ENCOUNTER — Ambulatory Visit (INDEPENDENT_AMBULATORY_CARE_PROVIDER_SITE_OTHER): Payer: 59 | Admitting: Surgery

## 2018-08-10 VITALS — BP 146/89 | HR 81 | Temp 97.3°F | Resp 18 | Ht 66.0 in | Wt 256.0 lb

## 2018-08-10 DIAGNOSIS — K439 Ventral hernia without obstruction or gangrene: Secondary | ICD-10-CM

## 2018-08-10 NOTE — Patient Instructions (Addendum)
Please try to maintain a healthy weight. Try walking 30 minutes a day for the first week and move up to 45 minutes the next week and thereafter.  Please try eating healthy snacks.     Hernia, Adult     A hernia happens when tissue inside your body pushes out through a weak spot in your belly muscles (abdominal wall). This makes a round lump (bulge). The lump may be:  In a scar from surgery that was done in your belly (incisional hernia).  Near your belly button (umbilical hernia).  In your groin (inguinal hernia). Your groin is the area where your leg meets your lower belly (abdomen). This kind of hernia could also be: ? In your scrotum, if you are male. ? In folds of skin around your vagina, if you are male.  In your upper thigh (femoral hernia).  Inside your belly (hiatal hernia). This happens when your stomach slides above the muscle between your belly and your chest (diaphragm). If your hernia is small and it does not cause pain, you may not need treatment. If your hernia is large or it causes pain, you may need surgery. Follow these instructions at home: Activity  Avoid stretching or overusing (straining) the muscles near your hernia. Straining can happen when you: ? Lift something heavy. ? Poop (have a bowel movement).  Do not lift anything that is heavier than 10 lb (4.5 kg), or the limit that you are told, until your doctor says that it is safe.  Use the strength of your legs when you lift something heavy. Do not use only your back muscles to lift. General instructions  Do these things if told by your doctor so you do not have trouble pooping (constipation): ? Drink enough fluid to keep your pee (urine) pale yellow. ? Eat foods that are high in fiber. These include fresh fruits and vegetables, whole grains, and beans. ? Limit foods that are high in fat and processed sugars. These include foods that are fried or sweet. ? Take medicine for trouble pooping.  When you  cough, try to cough gently.  You may try to push your hernia in by very gently pressing on it when you are lying down. Do not try to force the bulge back in if it will not push in easily.  If you are overweight, work with your doctor to lose weight safely.  Do not use any products that have nicotine or tobacco in them. These include cigarettes and e-cigarettes. If you need help quitting, ask your doctor.  If you will be having surgery (hernia repair), watch your hernia for changes in shape, size, or color. Tell your doctor if you see any changes.  Take over-the-counter and prescription medicines only as told by your doctor.  Keep all follow-up visits as told by your doctor. Contact a doctor if:  You get new pain, swelling, or redness near your hernia.  You poop fewer times in a week than normal.  You have trouble pooping.  You have poop (stool) that is more dry than normal.  You have poop that is harder or larger than normal. Get help right away if:  You have a fever.  You have belly pain that gets worse.  You feel sick to your stomach (nauseous).  You throw up (vomit).  Your hernia cannot be pushed in by very gently pressing on it when you are lying down. Do not try to force the bulge back in if it will not  push in easily.  Your hernia: ? Changes in shape or size. ? Changes color. ? Feels hard or it hurts when you touch it. These symptoms may represent a serious problem that is an emergency. Do not wait to see if the symptoms will go away. Get medical help right away. Call your local emergency services (911 in the [place] Summary  A hernia happens when tissue inside your body pushes out through a weak spot in the belly muscles. This creates a bulge.  If your hernia is small and it does not hurt, you may not need treatment. If your hernia is large or it hurts, you may need surgery.  If you will be having surgery, watch your hernia for changes in shape, size, or color.  Tell your doctor about any changes. This information is not intended to replace advice given to you by your health care provider. Make sure you discuss any questions you have with your health care provider. Document Released: 07/16/2009 Document Revised: 05/19/2018 Document Reviewed: 10/28/2016 Elsevier Patient Education  2020 [employer].

## 2018-08-10 NOTE — Progress Notes (Signed)
Outpatient Surgical Follow Up  08/10/2018  Karl Gentry is an 68 y.o. male.   Chief Complaint  Patient presents with  . New Patient (Initial Visit)    ventarl and bilateral inguinal hernias    HPI: 68 year old male with a known ventral hernia that has been present for years.  To my partner Dr. Celine Ahr and given the complexity of this ventral hernia she has referred the patient to me.  The patient reports some intermittent abdominal discomfort and achy pains that are mild.  Apparently the pain gets worse after he eats heavy meals.  He is morbidly obese with a BMI of 41.  He is able to perform more than 4 METS of activity without any shortness of breath or chest pain.  He did undergo a robotic prostatectomy close to 8 years ago and shortly after developed a ventral hernias. No previous ventral hernia repairs.  He did have a CBC and a CMP that was completely normal.  He did have also a CT scan of the abdomen and pelvis that I have personally reviewed showing evidence of large complex ventral hernia with small bowel.  There is no evidence of incarceration.  There is loss of domain.  Past Medical History:  Diagnosis Date  . 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 Cancer [facility]) 04-13-11   recent dx. prostate cancer, surgery planned  . ED (erectile dysfunction)   . Hypertension   . Inguinal hernia bilateral, non-recurrent 04-13-11   states bilateral at present / umbilical hernia also  . Prostate cancer ([place]) 04/23/11   bx=Adenocarcinoma,Gleason=4+3=7,PSA=5.5,volume  =36cc  . Radiation 03/02/2012-04/20/2012   prostate bed 6600 cGy  . Stress incontinence, male     Past Surgical History:  Procedure Laterality Date  . BIOPSY PROSTATE  2013   bx. 2'13 [facility]  . HERNIA REPAIR  1977    -open rt. right inguinal hernia repair/mesh  . ROBOT ASSISTED LAPAROSCOPIC RADICAL PROSTATECTOMY  04/23/2011   Procedure: ROBOTIC ASSISTED LAPAROSCOPIC RADICAL PROSTATECTOMY LEVEL 2;   Surgeon: Dutch Gray, MD;  Location: [facility];  Service: Urology;  Laterality: N/A;        Family History  Problem Relation Age of Onset  . Cancer Mother        breast age 33  . Cancer Father        colon age 31    Social History:  reports that he quit smoking about 50 years ago. His smoking use included pipe. He quit after 10.00 years of use. He has quit using smokeless tobacco.  His smokeless tobacco use included chew. He reports that he does not drink alcohol or use drugs.  Allergies: No Known Allergies  Medications reviewed.    ROS Full ROS performed and is otherwise negative other than what is stated in HPI   BP (!) 146/89   Pulse 81   Temp (!) 97.3 F (36.3 C) (Temporal)   Resp 18   Ht 5\' 6"  (1.676 m)   Wt 256 lb (116.1 kg)   SpO2 97%   BMI 41.32 kg/m   Physical Exam Vitals signs and nursing note reviewed. Exam conducted with a chaperone present.  Constitutional:      Appearance: He is obese.  Abdominal:     General: Abdomen is flat. There is no distension.     Palpations: There is no mass.     Tenderness: There is no guarding or rebound.     Hernia: A hernia is present.  Comments: Complex ventral hernia with loss of domain.  The hernia reduces easily.  No peritonitis. There is some skin ulceration. No evidence of leak.         Musculoskeletal: Normal range of motion.        General: No swelling or tenderness.  Skin:    General: Skin is warm and dry.     Capillary Refill: Capillary refill takes less than 2 seconds.     Coloration: Skin is not jaundiced.  Neurological:     General: No focal deficit present.     Mental Status: He is alert and oriented to person, place, and time.  Psychiatric:        Mood and Affect: Mood normal.        Behavior: Behavior normal.        Thought Content: Thought content normal.        Judgment: Judgment normal.       Assessment/Plan:  68 year old male with morbid obesity and a large complex ventral hernia with loss  of domain.  We had a lengthy discussion regarding the loss of domain and the likelihood of abdominal wall reconstruction.  Prior to that I do think that he needs to be optimized from a medical perspective and is specifically to obtain a BMI ideally less than 35.  I had an extensive discussion with him regarding his disease process as well as the outcomes associated with abdominal wall reconstruction is in morbidly obese patients.  He understands and he is motivated to follow strict diet and exercise.  I will see him back in about 6 to 8 weeks.  I do think that he will need an abdominal wall reconstruction obstruction with bilateral TAR release.  This is been ideally in the 35 range.  Extensive discussion with patient was performed.  Spent at least 40 minutes in this encounter with greater than 50% spent in counseling and coordination of care  Caroleen Hamman, MD [facility]

## 2018-09-21 ENCOUNTER — Other Ambulatory Visit: Payer: Self-pay

## 2018-09-21 ENCOUNTER — Ambulatory Visit (INDEPENDENT_AMBULATORY_CARE_PROVIDER_SITE_OTHER): Payer: 59 | Admitting: Surgery

## 2018-09-21 ENCOUNTER — Encounter: Payer: Self-pay | Admitting: Surgery

## 2018-09-21 VITALS — BP 150/69 | HR 72 | Temp 97.7°F | Resp 16 | Ht 66.0 in | Wt 235.8 lb

## 2018-09-21 DIAGNOSIS — K439 Ventral hernia without obstruction or gangrene: Secondary | ICD-10-CM | POA: Diagnosis not present

## 2018-09-21 NOTE — Patient Instructions (Addendum)
Continue with exercising/walking and maintain a healthy weight. Please see your follow up appointmnet listed below.  Ventral Hernia  A ventral hernia is a bulge of tissue from inside the abdomen that pushes through a weak area of the muscles that form the front wall of the abdomen. The tissues inside the abdomen are inside a sac (peritoneum). These tissues include the small intestine, large intestine, and the fatty tissue that covers the intestines (omentum). Sometimes, the bulge that forms a hernia contains intestines. Other hernias contain only fat. Ventral hernias do not go away without surgical treatment. There are several types of ventral hernias. You may have:  A hernia at an incision site from previous abdominal surgery (incisional hernia).  A hernia just above the belly button (epigastric hernia), or at the belly button (umbilical hernia). These types of hernias can develop from heavy lifting or straining.  A hernia that comes and goes (reducible hernia). It may be visible only when you lift or strain. This type of hernia can be pushed back into the abdomen (reduced).  A hernia that traps abdominal tissue inside the hernia (incarcerated hernia). This type of hernia does not reduce.  A hernia that cuts off blood flow to the tissues inside the hernia (strangulated hernia). The tissues can start to die if this happens. This is a very painful bulge that cannot be reduced. A strangulated hernia is a medical emergency. What are the causes? This condition is caused by abdominal tissue putting pressure on an area of weakness in the abdominal muscles. What increases the risk? The following factors may make you more likely to develop this condition:  Being male.  Being 66 or older.  Being overweight or obese.  Having had previous abdominal surgery, especially if there was an infection after surgery.  Having had an injury to the abdominal wall.  Having had several pregnancies.  Having a  buildup of fluid inside the abdomen (ascites). What are the signs or symptoms? The only symptom of a ventral hernia may be a painless bulge in the abdomen. A reducible hernia may be visible only when you strain, cough, or lift. Other symptoms may include:  Dull pain.  A feeling of pressure. Signs and symptoms of a strangulated hernia may include:  Increasing pain.  Nausea and vomiting.  Pain when pressing on the hernia.  The skin over the hernia turning red or purple.  Constipation.  Blood in the stool (feces). How is this diagnosed? This condition may be diagnosed based on:  Your symptoms.  Your medical history.  A physical exam. You may be asked to cough or strain while standing. These actions increase the pressure inside your abdomen and force the hernia through the opening in your muscles. Your health care provider may try to reduce the hernia by pressing on it.  Imaging studies, such as an ultrasound or CT scan. How is this treated? This condition is treated with surgery. If you have a strangulated hernia, surgery is done as soon as possible. If your hernia is small and not incarcerated, you may be asked to lose some weight before surgery. Follow these instructions at home:  Follow instructions from your health care provider about eating or drinking restrictions.  If you are overweight, your health care provider may recommend that you increase your activity level and eat a healthier diet.  Do not lift anything that is heavier than 10 lb (4.5 kg).  Return to your normal activities as told by your health care provider.  Ask your health care provider what activities are safe for you. You may need to avoid activities that increase pressure on your hernia.  Take over-the-counter and prescription medicines only as told by your health care provider.  Keep all follow-up visits as told by your health care provider. This is important. Contact a health care provider if:  Your  hernia gets larger.  Your hernia becomes painful. Get help right away if:  Your hernia becomes increasingly painful.  You have pain along with any of the following: ? Changes in skin color in the area of the hernia. ? Nausea. ? Vomiting. ? Fever. Summary  A ventral hernia is a bulge of tissue from inside the abdomen that pushes through a weak area of the muscles that form the front wall of the abdomen.  This condition is treated with surgery, which may be urgent depending on your hernia.  Do not lift anything that is heavier than 10 lb (4.5 kg), and follow activity instructions from your health care provider. This information is not intended to replace advice given to you by your health care provider. Make sure you discuss any questions you have with your health care provider. Document Released: 01/13/2012 Document Revised: 03/10/2017 Document Reviewed: 08/17/2016 Elsevier Patient Education  2020 [employer].

## 2018-09-23 NOTE — Progress Notes (Signed)
Outpatient Surgical Follow Up  09/23/2018  DEMONTAE ANTUNES is an 68 y.o. male.   Chief Complaint  Patient presents with  . Follow-up    Complex hernias    HPI: 68 year old male with complex ventral hernia with loss of domain.  He has lost significant weight and now is down to 235.  That is 20 pounds less from my previous visit.  His diet and exercising.  No fevers no chills no evidence of incarceration or strangulation.  He reports some improvement on his intermittent abdominal pain and discomfort caused by the hernia.  The pain is mild to moderate and sharp in nature.  No evidence of fevers chills or obstruction.  Past Medical History:  Diagnosis Date  . 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 Cancer [facility]) 04-13-11   recent dx. prostate cancer, surgery planned  . ED (erectile dysfunction)   . Hypertension   . Inguinal hernia bilateral, non-recurrent 04-13-11   states bilateral at present / umbilical hernia also  . Prostate cancer ([place]) 04/23/11   bx=Adenocarcinoma,Gleason=4+3=7,PSA=5.5,volume  =36cc  . Radiation 03/02/2012-04/20/2012   prostate bed 6600 cGy  . Stress incontinence, male     Past Surgical History:  Procedure Laterality Date  . BIOPSY PROSTATE  2013   bx. 2'13 [facility]  . HERNIA REPAIR  1977    -open rt. right inguinal hernia repair/mesh  . ROBOT ASSISTED LAPAROSCOPIC RADICAL PROSTATECTOMY  04/23/2011   Procedure: ROBOTIC ASSISTED LAPAROSCOPIC RADICAL PROSTATECTOMY LEVEL 2;  Surgeon: Dutch Gray, MD;  Location: [facility];  Service: Urology;  Laterality: N/A;        Family History  Problem Relation Age of Onset  . Cancer Mother        breast age 70  . Cancer Father        colon age 48    Social History:  reports that he quit smoking about 50 years ago. His smoking use included pipe. He quit after 10.00 years of use. He has quit using smokeless tobacco.  His smokeless tobacco use included chew. He reports that he does not drink alcohol or  use drugs.  Allergies: No Known Allergies  Medications reviewed.    ROS Full ROS performed and is otherwise negative other than what is stated in HPI   BP (!) 150/69   Pulse 72   Temp 97.7 F (36.5 C) (Temporal)   Resp 16   Ht 5\' 6"  (1.676 m)   Wt 235 lb 12.8 oz (107 kg)   SpO2 96%   BMI 38.06 kg/m   Physical Exam Vitals signs and nursing note reviewed.  Constitutional:      Appearance: Normal appearance. He is obese.  Eyes:     General: No scleral icterus.       Right eye: No discharge.        Left eye: No discharge.  Pulmonary:     Effort: Pulmonary effort is normal. No respiratory distress.  Abdominal:     General: Abdomen is flat. There is no distension.     Palpations: Abdomen is soft. There is no mass.     Tenderness: There is no abdominal tenderness. There is no guarding or rebound.     Hernia: A hernia is present.  Musculoskeletal: Normal range of motion.        General: No swelling.  Skin:    Capillary Refill: Capillary refill takes less than 2 seconds.  Neurological:     General: No focal deficit present.  Mental Status: He is alert and oriented to person, place, and time.  Psychiatric:        Mood and Affect: Mood normal.        Behavior: Behavior normal.        Thought Content: Thought content normal.        Judgment: Judgment normal.      Assessment/Plan: 67 year old male with complex ventral hernia and obesity.  He has lost significant weight and is motivated to lose even more.  I think his goal is to be right at 200 pounds.  He does have an upcoming wedding in a month and a half or so and wishes to have his surgery approximately on October.  If he continues to lose weight this is very promising.  Currently there is no need for any emergent surgical intervention. Greater than 50% of the 25 minutes  visit was spent in counseling/coordination of care   Caroleen Hamman, MD [facility]

## 2018-09-28 ENCOUNTER — Other Ambulatory Visit (HOSPITAL_COMMUNITY): Payer: Self-pay | Admitting: Urology

## 2018-09-28 ENCOUNTER — Other Ambulatory Visit: Payer: Self-pay | Admitting: Urology

## 2018-09-28 DIAGNOSIS — C61 Malignant neoplasm of prostate: Secondary | ICD-10-CM

## 2018-09-29 ENCOUNTER — Other Ambulatory Visit: Payer: Self-pay

## 2018-09-29 ENCOUNTER — Ambulatory Visit: Payer: 59 | Admitting: Internal Medicine

## 2018-09-29 ENCOUNTER — Encounter: Payer: Self-pay | Admitting: Internal Medicine

## 2018-09-29 VITALS — BP 128/70 | HR 85 | Ht 66.0 in | Wt 237.0 lb

## 2018-09-29 DIAGNOSIS — K439 Ventral hernia without obstruction or gangrene: Secondary | ICD-10-CM | POA: Diagnosis not present

## 2018-09-29 DIAGNOSIS — C61 Malignant neoplasm of prostate: Secondary | ICD-10-CM

## 2018-09-29 DIAGNOSIS — I10 Essential (primary) hypertension: Secondary | ICD-10-CM

## 2018-09-29 MED ORDER — OLMESARTAN MEDOXOMIL 20 MG PO TABS: 20.0000 mg | ORAL_TABLET | Freq: Every day | ORAL | 3 refills | Status: DC

## 2018-09-29 NOTE — Progress Notes (Signed)
Date:  09/29/2018   Name:  Karl Gentry   DOB:  Jun 11, 1950   MRN:  [id number]   Chief Complaint: Hypertension (3 month follow up.)  Hypertension This is a chronic problem. The problem has been rapidly improving since onset. Condition status: benicar started last visit - 130/70. Pertinent negatives include no chest pain, headaches or shortness of breath. Past treatments include angiotensin blockers. There are no compliance problems.  There is no history of kidney disease or CAD/MI.   Prostate cancer - he is s/p surgery and XRT; residual tissue remaining with slowly increasing PSA values.  This is being followed by Urology.  He denies obstructive sx.  Complex Hernia - he is working to lose weight before having his surgery.  He has lost 20 lb with diet change and exercise. Since his daughter is getting married in October, he plans to put the surgery off until November.  Lab Results  Component Value Date   CREATININE 0.87 06/21/2018   BUN 18 06/21/2018   NA 136 06/21/2018   K 4.1 06/21/2018   CL 102 06/21/2018   CO2 22 06/21/2018    Review of Systems  Constitutional: Positive for unexpected weight change (20 lbs with effort). Negative for chills, fatigue and fever.  Eyes: Negative for visual disturbance.  Respiratory: Negative for cough, chest tightness, shortness of breath and wheezing.   Cardiovascular: Negative for chest pain and leg swelling.  Neurological: Negative for dizziness, light-headedness and headaches.  Hematological: Negative for adenopathy.  Psychiatric/Behavioral: Negative for dysphoric mood and sleep disturbance. The patient is not nervous/anxious.     Patient Active Problem List   Diagnosis Date Noted  . Essential hypertension 06/21/2018  . Ventral hernia without obstruction or gangrene 06/21/2018  . Prostate cancer ([place]) 04/23/2011    No Known Allergies  Past Surgical History:  Procedure Laterality Date  . BIOPSY PROSTATE  2013   bx. 2'13  [facility]  . HERNIA REPAIR  1977    -open rt. right inguinal hernia repair/mesh  . ROBOT ASSISTED LAPAROSCOPIC RADICAL PROSTATECTOMY  04/23/2011   Procedure: ROBOTIC ASSISTED LAPAROSCOPIC RADICAL PROSTATECTOMY LEVEL 2;  Surgeon: Dutch Gray, MD;  Location: [facility];  Service: Urology;  Laterality: N/A;        Social History   Tobacco Use  . Smoking status: Former Smoker    Years: 10.00    Types: Pipe    Quit date: 1970    Years since quitting: 50.6  . Smokeless tobacco: Former Systems developer    Types: Chew  Substance Use Topics  . Alcohol use: No  . Drug use: No     Medication list has been reviewed and updated.  Current Meds  Medication Sig  . olmesartan (BENICAR) 20 MG tablet Take 1 tablet (20 mg total) by mouth daily.    PHQ 2/9 Scores 09/29/2018 06/21/2018  PHQ - 2 Score 0 0    BP Readings from Last 3 Encounters:  09/29/18 128/70  09/21/18 (!) 150/69  08/10/18 (!) 146/89    Physical Exam Vitals signs and nursing note reviewed.  Constitutional:      General: He is not in acute distress.    Appearance: He is well-developed.  HENT:     Head: Normocephalic and atraumatic.  Neck:     Musculoskeletal: Normal range of motion and neck supple.  Cardiovascular:     Rate and Rhythm: Normal rate and regular rhythm.     Pulses: Normal pulses.     Heart sounds:  No murmur.  Pulmonary:     Effort: Pulmonary effort is normal. No respiratory distress.  Abdominal:     Comments: Hernia not examined  Musculoskeletal: Normal range of motion.     Right lower leg: No edema.     Left lower leg: No edema.  Lymphadenopathy:     Cervical: No cervical adenopathy.  Skin:    General: Skin is warm and dry.     Capillary Refill: Capillary refill takes less than 2 seconds.     Findings: No rash.  Neurological:     General: No focal deficit present.     Mental Status: He is alert and oriented to person, place, and time.  Psychiatric:        Behavior: Behavior normal.         Thought Content: Thought content normal.     Wt Readings from Last 3 Encounters:  09/29/18 237 lb (107.5 kg)  09/21/18 235 lb 12.8 oz (107 kg)  08/10/18 256 lb (116.1 kg)    BP 128/70   Pulse 85   Ht 5\' 6"  (1.676 m)   Wt 237 lb (107.5 kg)   SpO2 96%   BMI 38.25 kg/m   Assessment and Plan: 1. Essential hypertension Clinically stable exam with well controlled BP.   Tolerating medications, telmisartan 20 mg daily, without side effects at this time. Pt to continue current regimen and low sodium diet; benefits of regular exercise as able discussed.  Continue efforts at weight loss. - olmesartan (BENICAR) 20 MG tablet; Take 1 tablet (20 mg total) by mouth daily.  Dispense: 90 tablet; Refill: 3  2. Prostate cancer ([place]) Underactive surveillance by Urology  3. Ventral hernia without obstruction or gangrene Planning surgery for November Continue exercise and diet changes for weight loss   Partially dictated using Editor, commissioning. Any errors are unintentional.  Halina Maidens, MD [facility]  09/29/2018

## 2018-12-05 ENCOUNTER — Ambulatory Visit: Payer: 59 | Admitting: Surgery

## 2018-12-05 ENCOUNTER — Other Ambulatory Visit: Payer: Self-pay

## 2018-12-05 ENCOUNTER — Encounter: Payer: Self-pay | Admitting: Surgery

## 2018-12-05 VITALS — BP 146/89 | HR 78 | Temp 97.7°F | Ht 66.0 in | Wt 227.4 lb

## 2018-12-05 DIAGNOSIS — K439 Ventral hernia without obstruction or gangrene: Secondary | ICD-10-CM | POA: Diagnosis not present

## 2018-12-05 NOTE — Patient Instructions (Addendum)
Our surgery scheduler will contact you within the next 24-48 hours to get you scheduled. Please have the BLUE sheet available when surgery scheduler contacts you. Please contact our office if you have any questions or concerns.  Ventral Hernia  A ventral hernia is a bulge of tissue from inside the abdomen that pushes through a weak area of the muscles that form the front wall of the abdomen. The tissues inside the abdomen are inside a sac (peritoneum). These tissues include the small intestine, large intestine, and the fatty tissue that covers the intestines (omentum). Sometimes, the bulge that forms a hernia contains intestines. Other hernias contain only fat. Ventral hernias do not go away without surgical treatment. There are several types of ventral hernias. You may have:  A hernia at an incision site from previous abdominal surgery (incisional hernia).  A hernia just above the belly button (epigastric hernia), or at the belly button (umbilical hernia). These types of hernias can develop from heavy lifting or straining.  A hernia that comes and goes (reducible hernia). It may be visible only when you lift or strain. This type of hernia can be pushed back into the abdomen (reduced).  A hernia that traps abdominal tissue inside the hernia (incarcerated hernia). This type of hernia does not reduce.  A hernia that cuts off blood flow to the tissues inside the hernia (strangulated hernia). The tissues can start to die if this happens. This is a very painful bulge that cannot be reduced. A strangulated hernia is a medical emergency. What are the causes? This condition is caused by abdominal tissue putting pressure on an area of weakness in the abdominal muscles. What increases the risk? The following factors may make you more likely to develop this condition:  Being male.  Being 40 or older.  Being overweight or obese.  Having had previous abdominal surgery, especially if there was an  infection after surgery.  Having had an injury to the abdominal wall.  Having had several pregnancies.  Having a buildup of fluid inside the abdomen (ascites). What are the signs or symptoms? The only symptom of a ventral hernia may be a painless bulge in the abdomen. A reducible hernia may be visible only when you strain, cough, or lift. Other symptoms may include:  Dull pain.  A feeling of pressure. Signs and symptoms of a strangulated hernia may include:  Increasing pain.  Nausea and vomiting.  Pain when pressing on the hernia.  The skin over the hernia turning red or purple.  Constipation.  Blood in the stool (feces). How is this diagnosed? This condition may be diagnosed based on:  Your symptoms.  Your medical history.  A physical exam. You may be asked to cough or strain while standing. These actions increase the pressure inside your abdomen and force the hernia through the opening in your muscles. Your health care provider may try to reduce the hernia by pressing on it.  Imaging studies, such as an ultrasound or CT scan. How is this treated? This condition is treated with surgery. If you have a strangulated hernia, surgery is done as soon as possible. If your hernia is small and not incarcerated, you may be asked to lose some weight before surgery. Follow these instructions at home:  Follow instructions from your health care provider about eating or drinking restrictions.  If you are overweight, your health care provider may recommend that you increase your activity level and eat a healthier diet.  Do not lift anything  that is heavier than 10 lb (4.5 kg).  Return to your normal activities as told by your health care provider. Ask your health care provider what activities are safe for you. You may need to avoid activities that increase pressure on your hernia.  Take over-the-counter and prescription medicines only as told by your health care provider.  Keep all  follow-up visits as told by your health care provider. This is important. Contact a health care provider if:  Your hernia gets larger.  Your hernia becomes painful. Get help right away if:  Your hernia becomes increasingly painful.  You have pain along with any of the following: ? Changes in skin color in the area of the hernia. ? Nausea. ? Vomiting. ? Fever. Summary  A ventral hernia is a bulge of tissue from inside the abdomen that pushes through a weak area of the muscles that form the front wall of the abdomen.  This condition is treated with surgery, which may be urgent depending on your hernia.  Do not lift anything that is heavier than 10 lb (4.5 kg), and follow activity instructions from your health care provider. This information is not intended to replace advice given to you by your health care provider. Make sure you discuss any questions you have with your health care provider. Document Released: 01/13/2012 Document Revised: 03/10/2017 Document Reviewed: 08/17/2016 Elsevier Patient Education  [place] Repair, Adult  Open hernia repair is a surgical procedure to fix a hernia. A hernia occurs when an internal organ or tissue pushes out through a weak spot in the abdominal wall muscles. Hernias commonly occur in the groin and around the navel. Most hernias tend to get worse over time. Often, surgery is done to prevent the hernia from becoming bigger, uncomfortable, or an emergency. Emergency surgery may be needed if abdominal contents get stuck in the opening (incarcerated hernia) or the blood supply gets cut off (strangulated hernia). In an open repair, an incision is made in the abdomen to perform the surgery. Tell a health care provider about:  Any allergies you have.  All medicines you are taking, including vitamins, herbs, eye drops, creams, and over-the-counter medicines.  Any problems you or family members have had with anesthetic medicines.   Any blood or bone disorders you have.  Any surgeries you have had.  Any medical conditions you have, including any recent cold or flu symptoms.  Whether you are pregnant or may be pregnant. What are the risks? Generally, this is a safe procedure. However, problems may occur, including:  Long-lasting (chronic) pain.  Bleeding.  Infection.  Damage to the testicle. This can cause shrinking or swelling.  Damage to the bladder, blood vessels, intestine, or nerves near the hernia.  Trouble passing urine.  Allergic reactions to medicines.  Return of the hernia. What happens before the procedure? Staying hydrated Follow instructions from your health care provider about hydration, which may include:  Up to 2 hours before the procedure - you may continue to drink clear liquids, such as water, clear fruit juice, black coffee, and plain tea. Eating and drinking restrictions Follow instructions from your health care provider about eating and drinking, which may include:  8 hours before the procedure - stop eating heavy meals or foods such as meat, fried foods, or fatty foods.  6 hours before the procedure - stop eating light meals or foods, such as toast or cereal.  6 hours before the procedure - stop drinking milk or  drinks that contain milk.  2 hours before the procedure - stop drinking clear liquids. Medicines  Ask your health care provider about: ? Changing or stopping your regular medicines. This is especially important if you are taking diabetes medicines or blood thinners. ? Taking medicines such as aspirin and ibuprofen. These medicines can thin your blood. Do not take these medicines before your procedure if your health care provider instructs you not to.  You may be given antibiotic medicine to help prevent infection. General instructions  You may have blood tests or imaging studies.  Ask your health care provider how your surgical site will be marked or identified.   If you smoke, do not smoke for at least 2 weeks before your procedure or for as long as told by your health care provider.  Let your health care provider know if you develop a cold or any infection before your surgery.  Plan to have someone take you home from the hospital or clinic.  If you will be going home right after the procedure, plan to have someone with you for 24 hours. What happens during the procedure?  To reduce your risk of infection: ? Your health care team will wash or sanitize their hands. ? Your skin will be washed with soap. ? Hair may be removed from the surgical area.  An IV tube will be inserted into one of your veins.  You will be given one or more of the following: ? A medicine to help you relax (sedative). ? A medicine to numb the area (local anesthetic). ? A medicine to make you fall asleep (general anesthetic).  Your surgeon will make an incision over the hernia.  The tissues of the hernia will be moved back into place.  The edges of the hernia may be stitched together.  The opening in the abdominal muscles will be closed with stitches (sutures). Or, your surgeon will place a mesh patch made of manmade (synthetic) material over the opening.  The incision will be closed.  A bandage (dressing) may be placed over the incision. The procedure may vary among health care providers and hospitals. What happens after the procedure?  Your blood pressure, heart rate, breathing rate, and blood oxygen level will be monitored until the medicines you were given have worn off.  You may be given medicine for pain.  Do not drive for 24 hours if you received a sedative. This information is not intended to replace advice given to you by your health care provider. Make sure you discuss any questions you have with your health care provider. Document Released: 07/22/2000 Document Revised: 01/08/2017 Document Reviewed: 07/10/2015 Elsevier Patient Education  2020  [employer].

## 2018-12-05 NOTE — Progress Notes (Signed)
Outpatient Surgical Follow Up  12/05/2018  Karl Gentry is an 68 y.o. male.   Chief Complaint  Patient presents with  . Surgery Discussion    Hernia Repair Surgery    HPI: Ahmire is a 68 year old male well-known to me with a history of symptomatic ventral hernia with loss of domain with significant weight loss of about 30 pounds for the last 3 months or so.  He reports some mild intermittent discomfort but it has improved significantly after the weight loss. Is able to perform more than 4 METS of activity without any shortness of breath or chest pain.  Labs including a CBC and a CMP were reviewed within normal limits  Past Medical History:  Diagnosis Date  . 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 Cancer [facility]) 04-13-11   recent dx. prostate cancer, surgery planned  . ED (erectile dysfunction)   . Hypertension   . Inguinal hernia bilateral, non-recurrent 04-13-11   states bilateral at present / umbilical hernia also  . Prostate cancer ([place]) 04/23/11   bx=Adenocarcinoma,Gleason=4+3=7,PSA=5.5,volume  =36cc  . Radiation 03/02/2012-04/20/2012   prostate bed 6600 cGy  . Stress incontinence, male     Past Surgical History:  Procedure Laterality Date  . BIOPSY PROSTATE  2013   bx. 2'13 [facility]  . HERNIA REPAIR  1977    -open rt. right inguinal hernia repair/mesh  . ROBOT ASSISTED LAPAROSCOPIC RADICAL PROSTATECTOMY  04/23/2011   Procedure: ROBOTIC ASSISTED LAPAROSCOPIC RADICAL PROSTATECTOMY LEVEL 2;  Surgeon: Dutch Gray, MD;  Location: [facility] ORS;  Service: Urology;  Laterality: N/A;        Family History  Problem Relation Age of Onset  . Cancer Mother        breast age 10  . Cancer Father        colon age 61    Social History:  reports that he quit smoking about 50 years ago. His smoking use included pipe. He quit after 10.00 years of use. He has quit using smokeless tobacco.  His smokeless tobacco use included chew. He reports that he does not drink  alcohol or use drugs.  Allergies: No Known Allergies  Medications reviewed.    ROS Full ROS performed and is otherwise negative other than what is stated in HPI   BP (!) 146/89   Pulse 78   Temp 97.7 F (36.5 C) (Temporal)   Ht 5\' 6"  (1.676 m)   Wt 227 lb 6.4 oz (103.1 kg)   SpO2 98%   BMI 36.70 kg/m   Physical Exam Physical Exam Vitals signs and nursing note reviewed.  Physical Exam Vitals signs and nursing note reviewed. Exam conducted with a chaperone present.  Constitutional:      Appearance: He is obese.  Abdominal:     General: Abdomen is flat. There is no distension.     Palpations: There is no mass.     Tenderness: There is no guarding or rebound.     Hernia: A hernia is present.     Comments: Complex ventral hernia with loss of domain.  The hernia reduces easily.  No peritonitis. There is some skin ulceration. No evidence of leak.         Musculoskeletal: Normal range of motion.        General: No swelling or tenderness.  Skin:    General: Skin is warm and dry.     Capillary Refill: Capillary refill takes less than 2 seconds.     Coloration:  Skin is not jaundiced.  Neurological:     General: No focal deficit present.     Mental Status: He is alert and oriented to person, place, and time.  Psychiatric:        Mood and Affect: Mood normal.        Behavior: Behavior normal.        Thought Content: Thought content normal.        Judgment: Judgment normal.   Assessment/Plan: 68 year old male with multiple abdominal surgeries and now with a symptomatic large ventral hernia with loss of domain.  He has lost 30 pounds and continues to lose weight.  I do think that we are significantly better now as we wear a few months ago and I have an extensive discussion with the patient regarding preoperative optimization.  He wishes to postpone the surgery until after Thanksgiving and in the interim he will continue to do diet and exercise. We will plan on doing open ventral  hernia repair with abdominal wall reconstruction and TAR release with mesh placement.  Procedure discussed with the patient in detail.  Risk, benefits and possible complications including but not limited to: Bleeding, infection, chronic pain, seromas, bowel injuries, mesh issues, recurrences and prolonged hospitalization.  He understands and wishes to proceed. Greater than 50% of the 40 minutes  visit was spent in counseling/coordination of care   Caroleen Hamman, MD [place] Surgeon

## 2018-12-06 ENCOUNTER — Telehealth: Payer: Self-pay

## 2018-12-06 NOTE — Telephone Encounter (Signed)
Medical Clearance received from DR.Berglund low risk for surgery.

## 2018-12-07 ENCOUNTER — Telehealth: Payer: Self-pay

## 2018-12-07 NOTE — Telephone Encounter (Signed)
Pt has been advised of pre admission date/time, Covid Testing date and Surgery date.  Surgery Date:01/10/19 Preadmission Testing Date: 01/03/19 at 9:00 am Covid Testing Date: 01/06/19 - patient advised to go to the [place] ([place]) between 8-10:30 am  [facility] Video sent via [facility] Surgical Video and [employer].  Patient has been made aware to call [phone], between 1-3:00pm the day before surgery, to find out what time to arrive.

## 2018-12-09 ENCOUNTER — Telehealth: Payer: Self-pay

## 2018-12-09 NOTE — Telephone Encounter (Signed)
Call to patient to let him know that December 1st would not be available for his surgery as Dr Dahlia Byes will be out of town.  Surgery has been rescheduled to 01/17/19 with Dr Dahlia Byes, Dr Genevive Bi will be assisting.   Pt has been advised of pre admission date/time, Covid Testing date and Surgery date.  Surgery Date: 01/17/19 Preadmission Testing Date: 01/03/19 at 9:00 am Covid Testing Date: 01/13/19 - patient advised to go to the [place] ([place])  [employer] Video sent via [employer] Surgical Video and [employer].  Patient has been made aware to call [phone], between 1-3:00pm the day before surgery, to find out what time to arrive.

## 2018-12-28 ENCOUNTER — Telehealth: Payer: Self-pay | Admitting: *Deleted

## 2018-12-28 NOTE — Telephone Encounter (Signed)
Patient called in today to make sure it is okay to have his bone scan done on 01-09-2019. I avdised him it was okay. He is having a ventral hernia repair on 01/17/2019 with Dr. Dahlia Byes

## 2019-01-03 ENCOUNTER — Encounter
Admission: RE | Admit: 2019-01-03 | Discharge: 2019-01-03 | Disposition: A | Discharge status: Home / Self Care | Payer: No Typology Code available for payment source | Source: Ambulatory Visit | Attending: Surgery | Admitting: Surgery

## 2019-01-03 ENCOUNTER — Other Ambulatory Visit: Payer: Self-pay

## 2019-01-03 ENCOUNTER — Other Ambulatory Visit: Payer: 59

## 2019-01-03 DIAGNOSIS — Z01818 Encounter for other preprocedural examination: Secondary | ICD-10-CM | POA: Insufficient documentation

## 2019-01-03 HISTORY — DX: Unspecified abdominal pain: R10.9

## 2019-01-03 NOTE — Patient Instructions (Addendum)
Your procedure is scheduled on: 01/17/2019 Tues Report to Same Day Surgery 2nd floor medical mall [facility] Entrance-take elevator on left to 2nd floor.  Check in with surgery information desk.) To find out your arrival time please call [phone] between 1PM - 3PM on 01/16/2019 Mon  Remember: Instructions that are not followed completely may result in serious medical risk, up to and including death, or upon the discretion of your surgeon and anesthesiologist your surgery may need to be rescheduled.    _x___ 1. Do not eat food after midnight the night before your procedure. You may drink clear liquids up to 2 hours before you are scheduled to arrive at the hospital for your procedure.  Do not drink clear liquids within 2 hours of your scheduled arrival to the hospital.  Clear liquids include  --Water or Apple juice without pulp  --Clear carbohydrate beverage such as ClearFast or Gatorade  --Black Coffee or Clear Tea (No milk, no creamers, do not add anything to                  the coffee or Tea Type 1 and type 2 diabetics should only drink water.   ____Ensure clear carbohydrate drink on the way to the hospital for bariatric patients  ____Ensure clear carbohydrate drink 3 hours before surgery.   No gum chewing or hard candies.     __x__ 2. No Alcohol for 24 hours before or after surgery.   __x__3. No Smoking or e-cigarettes for 24 prior to surgery.  Do not use any chewable tobacco products for at least 6 hour prior to surgery   ____  4. Bring all medications with you on the day of surgery if instructed.    __x__ 5. Notify your doctor if there is any change in your medical condition     (cold, fever, infections).    x___6. On the morning of surgery brush your teeth with toothpaste and water.  You may rinse your mouth with mouth wash if you wish.  Do not swallow any toothpaste or mouthwash.   Do not wear jewelry, make-up, hairpins, clips or nail polish.  Do not wear lotions,  powders, or perfumes. You may wear deodorant.  Do not shave 48 hours prior to surgery. Men may shave face and neck.  Do not bring valuables to the hospital.    [facility] is not responsible for any belongings or valuables.               Contacts, dentures or bridgework may not be worn into surgery.  Leave your suitcase in the car. After surgery it may be brought to your room.  For patients admitted to the hospital, discharge time is determined by your                       treatment team.  _  Patients discharged the day of surgery will not be allowed to drive home.  You will need someone to drive you home and stay with you the night of your procedure.    Please read over the following fact sheets that you were given:   [facility] Preparing for Surgery and or MRSA Information   _x___ Take anti-hypertensive listed below, cardiac, seizure, asthma,     anti-reflux and psychiatric medicines. These include:  1. None  2.  3.  4.  5.  6.  ____Fleets enema or Magnesium Citrate as directed.   _x___ Use CHG Soap or  sage wipes as directed on instruction sheet   ____ Use inhalers on the day of surgery and bring to hospital day of surgery  ____ Stop Metformin and Janumet 2 days prior to surgery.    ____ Take 1/2 of usual insulin dose the night before surgery and none on the morning     surgery.   _x___ Follow recommendations from Cardiologist, Pulmonologist or PCP regarding          stopping Aspirin, Coumadin, Plavix ,Eliquis, Effient, or Pradaxa, and Pletal.  X____Stop Anti-inflammatories such as Advil, Aleve, Ibuprofen, Motrin, Naproxen, Naprosyn, Goodies powders or aspirin products. OK to take Tylenol and                          Celebrex.   _x___ Stop supplements until after surgery.  But may continue Vitamin D, Vitamin B,       and multivitamin.   ____ Bring C-Pap to the hospital.

## 2019-01-06 ENCOUNTER — Other Ambulatory Visit: Payer: 59

## 2019-01-09 ENCOUNTER — Encounter (HOSPITAL_COMMUNITY)
Admission: RE | Admit: 2019-01-09 | Discharge: 2019-01-09 | Disposition: A | Discharge status: Home / Self Care | Payer: 59 | Source: Ambulatory Visit | Attending: Urology | Admitting: Urology

## 2019-01-09 ENCOUNTER — Other Ambulatory Visit: Payer: Self-pay

## 2019-01-09 ENCOUNTER — Ambulatory Visit (HOSPITAL_COMMUNITY)
Admission: RE | Admit: 2019-01-09 | Discharge: 2019-01-09 | Disposition: A | Discharge status: Home / Self Care | Payer: 59 | Source: Ambulatory Visit | Attending: Urology | Admitting: Urology

## 2019-01-09 DIAGNOSIS — C61 Malignant neoplasm of prostate: Secondary | ICD-10-CM | POA: Diagnosis not present

## 2019-01-09 MED ORDER — TECHNETIUM TC 99M MEDRONATE IV KIT
20.0000 | PACK | Freq: Once | INTRAVENOUS | Status: AC | PRN
  Administered 2019-01-09: 21.8 via INTRAVENOUS

## 2019-01-11 ENCOUNTER — Other Ambulatory Visit: Payer: 59

## 2019-01-13 ENCOUNTER — Telehealth: Payer: Self-pay | Admitting: Surgery

## 2019-01-13 ENCOUNTER — Other Ambulatory Visit: Admission: RE | Admit: 2019-01-13 | Payer: 59 | Source: Ambulatory Visit

## 2019-01-13 NOTE — Telephone Encounter (Signed)
Pt has been advised of pre admission date/time, Covid Testing date and Surgery date.  Surgery Date: 02/14/19 with Dr Alinda Dooms wall reconstruction. Ventral hernia repair.  Preadmission Testing Date: 02/07/19 @ 9:00-office interview. Pt advised to arrive at the [facility].  Covid Testing Date: 02/09/19 between 8-10:30am - patient advised to go to the [place] ([place])  [employer] Video sent via [employer] Surgical Video and [employer].  Patient has been made aware to call [phone], between 1-3:00pm the day before surgery, to find out what time to arrive.    I have also mailed all instructions to the patient.

## 2019-02-01 ENCOUNTER — Telehealth: Payer: Self-pay | Admitting: Surgery

## 2019-02-01 NOTE — Telephone Encounter (Signed)
Pt has been advised of pre admission date/time, Covid Testing date and Surgery date.  Surgery date was changed from 02/14/19 to 03/07/19 due to covid restrictions. Patient also was given an appointment to see Dr Dahlia Byes in the office to update H&P.   Surgery Date: 03/07/19 with Dr De Burrs hernia repair/abd wall reconstruction.  Preadmission Testing Date: will need to be updated at office visit.  Covid Testing Date: will need to be updated at office visit.  - patient advised to go to the [place] ([place])  [employer] Video sent via [facility] Surgical Video and [employer].  Patient has been made aware to call [phone], between 1-3:00pm the day before surgery, to find out what time to arrive.

## 2019-02-07 ENCOUNTER — Other Ambulatory Visit: Payer: 59

## 2019-02-09 ENCOUNTER — Other Ambulatory Visit: Payer: 59

## 2019-02-10 DIAGNOSIS — Z87898 Personal history of other specified conditions: Secondary | ICD-10-CM

## 2019-02-10 HISTORY — DX: Personal history of other specified conditions: Z87.898

## 2019-02-14 ENCOUNTER — Encounter: Admission: RE | Payer: Self-pay | Source: Home / Self Care

## 2019-02-14 ENCOUNTER — Inpatient Hospital Stay: Admission: RE | Admit: 2019-02-14 | Payer: 59 | Source: Home / Self Care | Admitting: Surgery

## 2019-02-14 SURGERY — REPAIR, HERNIA, VENTRAL
Anesthesia: General

## 2019-02-22 ENCOUNTER — Ambulatory Visit: Payer: Medicare Other | Admitting: Surgery

## 2019-03-13 ENCOUNTER — Ambulatory Visit (INDEPENDENT_AMBULATORY_CARE_PROVIDER_SITE_OTHER): Payer: 59 | Admitting: Surgery

## 2019-03-13 ENCOUNTER — Other Ambulatory Visit: Payer: Self-pay

## 2019-03-13 ENCOUNTER — Encounter: Payer: Self-pay | Admitting: Surgery

## 2019-03-13 VITALS — BP 162/95 | HR 76 | Temp 97.5°F | Resp 14 | Ht 66.0 in | Wt 237.8 lb

## 2019-03-13 DIAGNOSIS — K439 Ventral hernia without obstruction or gangrene: Secondary | ICD-10-CM | POA: Diagnosis not present

## 2019-03-13 NOTE — H&P (View-Only) (Signed)
Outpatient Surgical Follow Up  03/13/2019  Karl Gentry is an 69 y.o. male.   Chief Complaint  Patient presents with  . Follow-up    Ventral Hernia    HPI: Karl Gentry is a 69 year old male well-known to me with a history of symptomatic ventral hernia with loss of domain has had intentional weight loss in preparation for his surgery. Marland Kitchen  He reports some mild intermittent discomfort but it has improved significantly after the weight loss.  He Is able to perform more than 4 METS of activity without any shortness of breath or chest pain.  Labs including a CBC and a CMP were reviewed within normal limits. CT scan personally reviewed showing evidence of large ventral hernia with loss of domain.  There is also bilateral inguinal hernias left greater than right and then the left side contains the urinary bladder.  Past Medical History:  Diagnosis Date  . Abdominal pain   . 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 Cancer [facility]) 04-13-11   recent dx. prostate cancer, surgery planned  . ED (erectile dysfunction)   . Hypertension   . Inguinal hernia bilateral, non-recurrent 04-13-11   states bilateral at present / umbilical hernia also  . Prostate cancer ([place]) 04/23/11   bx=Adenocarcinoma,Gleason=4+3=7,PSA=5.5,volume  =36cc  . Radiation 03/02/2012-04/20/2012   prostate bed 6600 cGy  . Stress incontinence, male     Past Surgical History:  Procedure Laterality Date  . BIOPSY PROSTATE  2013   bx. 2'13 [facility]  . HERNIA REPAIR  1977    -open rt. right inguinal hernia repair/mesh  . ROBOT ASSISTED LAPAROSCOPIC RADICAL PROSTATECTOMY  04/23/2011   Procedure: ROBOTIC ASSISTED LAPAROSCOPIC RADICAL PROSTATECTOMY LEVEL 2;  Surgeon: Dutch Gray, MD;  Location: [facility];  Service: Urology;  Laterality: N/A;        Family History  Problem Relation Age of Onset  . Cancer Mother        breast age 49  . Cancer Father        colon age 5    Social History:  reports that he quit  smoking about 51 years ago. His smoking use included pipe. He quit after 10.00 years of use. He has quit using smokeless tobacco.  His smokeless tobacco use included chew. He reports that he does not drink alcohol or use drugs.  Allergies: No Known Allergies  Medications reviewed.    ROS Full ROS performed and is otherwise negative other than what is stated in HPI   BP (!) 162/95   Pulse 76   Temp (!) 97.5 F (36.4 C)   Resp 14   Ht 5\' 6"  (1.676 m)   Wt 237 lb 12.8 oz (107.9 kg)   SpO2 97%   BMI 38.38 kg/m   Physical Exam Vitals and nursing note reviewed. Exam conducted with a chaperone present.  Constitutional:      General: He is not in acute distress.    Appearance: Normal appearance. He is obese.  Eyes:     General:        Right eye: No discharge.        Left eye: No discharge.     Extraocular Movements: Extraocular movements intact.     Conjunctiva/sclera: Conjunctivae normal.  Cardiovascular:     Rate and Rhythm: Normal rate and regular rhythm.     Heart sounds: No murmur. No gallop.   Pulmonary:     Effort: Pulmonary effort is normal. No respiratory distress.  Breath sounds: Normal breath sounds. No stridor. No wheezing or rhonchi.  Abdominal:     General: Abdomen is flat. There is no distension.     Tenderness: There is no right CVA tenderness, left CVA tenderness, guarding or rebound.     Hernia: A hernia is present.     Comments: Loss of domain, reducible ventral hernia  Musculoskeletal:        General: No swelling or tenderness. Normal range of motion.     Cervical back: Normal range of motion and neck supple. No rigidity.  Lymphadenopathy:     Cervical: No cervical adenopathy.  Skin:    General: Skin is warm and dry.     Capillary Refill: Capillary refill takes less than 2 seconds.  Neurological:     General: No focal deficit present.     Mental Status: He is alert and oriented to person, place, and time.  Psychiatric:        Mood and Affect:  Mood normal.        Behavior: Behavior normal.        Thought Content: Thought content normal.        Judgment: Judgment normal.         Assessment/Plan:  Mr. Karl Gentry is a 69 year old male well-known to me with a ventral hernia with loss of domain as well as bilateral inguinal hernias..  He has been optimized from medical perspective and has lost significant weight. I do think that we will be able to perform the abdominal wall reconstruction and also include the bilateral inguinal hernia repair during the same operative setting.  Procedure discussed with the patient in detail.  Risk, benefits and possible complications including but not limited to: Bleeding, infection, recurrence, bowel injuries, chronic pain and prolonged hospitalization.  He understands and wishes to proceed. Greater than 50% of the 40 minutes  visit was spent in counseling/coordination of care   Caroleen Hamman, MD [facility]

## 2019-03-13 NOTE — Patient Instructions (Addendum)
You have requested for your Umbilical Hernia be repaired. This has been scheduled by Dr.Pabon at [facility].  Please see your (blue)pre-care sheet for information. Our surgery scheduled will call you to look at a surgery date, go over Covid testing date and surgery instructions.   You will need to arrange to be off work for 1-2 weeks but will have to have a lifting restriction of no more than 15 lbs for 6 weeks following your surgery.   Umbilical Hernia, Adult A hernia is a bulge of tissue that pushes through an opening between muscles. An umbilical hernia happens in the abdomen, near the belly button (umbilicus). The hernia may contain tissues from the small intestine, large intestine, or fatty tissue covering the intestines (omentum). Umbilical hernias in adults tend to get worse over time, and they require surgical treatment. There are several types of umbilical hernias. You may have:  A hernia located just above or below the umbilicus (indirect hernia). This is the most common type of umbilical hernia in adults.  A hernia that forms through an opening formed by the umbilicus (direct hernia).  A hernia that comes and goes (reducible hernia). A reducible hernia may be visible only when you strain, lift something heavy, or cough. This type of hernia can be pushed back into the abdomen (reduced).  A hernia that traps abdominal tissue inside the hernia (incarcerated hernia). This type of hernia cannot be reduced.  A hernia that cuts off blood flow to the tissues inside the hernia (strangulated hernia). The tissues can start to die if this happens. This type of hernia requires emergency treatment.  What are the causes? An umbilical hernia happens when tissue inside the abdomen presses on a weak area of the abdominal muscles. What increases the risk? You may have a greater risk of this condition if you:  Are obese.  Have had several pregnancies.  Have a buildup of fluid inside your  abdomen (ascites).  Have had surgery that weakens the abdominal muscles.  What are the signs or symptoms? The main symptom of this condition is a painless bulge at or near the belly button. A reducible hernia may be visible only when you strain, lift something heavy, or cough. Other symptoms may include:  Dull pain.  A feeling of pressure.  Symptoms of a strangulated hernia may include:  Pain that gets increasingly worse.  Nausea and vomiting.  Pain when pressing on the hernia.  Skin over the hernia becoming red or purple.  Constipation.  Blood in the stool.  How is this diagnosed? This condition may be diagnosed based on:  A physical exam. You may be asked to cough or strain while standing. These actions increase the pressure inside your abdomen and force the hernia through the opening in your muscles. Your health care provider may try to reduce the hernia by pressing on it.  Your symptoms and medical history.  How is this treated? Surgery is the only treatment for an umbilical hernia. Surgery for a strangulated hernia is done as soon as possible. If you have a small hernia that is not incarcerated, you may need to lose weight before having surgery. Follow these instructions at home:  Lose weight, if told by your health care provider.  Do not try to push the hernia back in.  Watch your hernia for any changes in color or size. Tell your health care provider if any changes occur.  You may need to avoid activities that increase pressure on your  hernia.  Do not lift anything that is heavier than 10 lb (4.5 kg) until your health care provider says that this is safe.  Take over-the-counter and prescription medicines only as told by your health care provider.  Keep all follow-up visits as told by your health care provider. This is important. Contact a health care provider if:  Your hernia gets larger.  Your hernia becomes painful. Get help right away if:  You develop  sudden, severe pain near the area of your hernia.  You have pain as well as nausea or vomiting.  You have pain and the skin over your hernia changes color.  You develop a fever. This information is not intended to replace advice given to you by your health care provider. Make sure you discuss any questions you have with your health care provider. Document Released: 06/28/2015 Document Revised: 09/29/2015 Document Reviewed: 06/28/2015 Elsevier Interactive Patient Education  [employer].

## 2019-03-13 NOTE — Progress Notes (Signed)
Outpatient Surgical Follow Up  03/13/2019  KETCHER LOTTMAN is an 69 y.o. male.   Chief Complaint  Patient presents with  . Follow-up    Ventral Hernia    HPI: Ernestine is a 69 year old male well-known to me with a history of symptomatic ventral hernia with loss of domain has had intentional weight loss in preparation for his surgery. Marland Kitchen  He reports some mild intermittent discomfort but it has improved significantly after the weight loss.  He Is able to perform more than 4 METS of activity without any shortness of breath or chest pain.  Labs including a CBC and a CMP were reviewed within normal limits. CT scan personally reviewed showing evidence of large ventral hernia with loss of domain.  There is also bilateral inguinal hernias left greater than right and then the left side contains the urinary bladder.  Past Medical History:  Diagnosis Date  . Abdominal pain   . 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 Cancer [facility]) 04-13-11   recent dx. prostate cancer, surgery planned  . ED (erectile dysfunction)   . Hypertension   . Inguinal hernia bilateral, non-recurrent 04-13-11   states bilateral at present / umbilical hernia also  . Prostate cancer ([place]) 04/23/11   bx=Adenocarcinoma,Gleason=4+3=7,PSA=5.5,volume  =36cc  . Radiation 03/02/2012-04/20/2012   prostate bed 6600 cGy  . Stress incontinence, male     Past Surgical History:  Procedure Laterality Date  . BIOPSY PROSTATE  2013   bx. 2'13 [facility]  . HERNIA REPAIR  1977    -open rt. right inguinal hernia repair/mesh  . ROBOT ASSISTED LAPAROSCOPIC RADICAL PROSTATECTOMY  04/23/2011   Procedure: ROBOTIC ASSISTED LAPAROSCOPIC RADICAL PROSTATECTOMY LEVEL 2;  Surgeon: Dutch Gray, MD;  Location: [facility];  Service: Urology;  Laterality: N/A;        Family History  Problem Relation Age of Onset  . Cancer Mother        breast age 62  . Cancer Father        colon age 60    Social History:  reports that he quit  smoking about 51 years ago. His smoking use included pipe. He quit after 10.00 years of use. He has quit using smokeless tobacco.  His smokeless tobacco use included chew. He reports that he does not drink alcohol or use drugs.  Allergies: No Known Allergies  Medications reviewed.    ROS Full ROS performed and is otherwise negative other than what is stated in HPI   BP (!) 162/95   Pulse 76   Temp (!) 97.5 F (36.4 C)   Resp 14   Ht 5\' 6"  (1.676 m)   Wt 237 lb 12.8 oz (107.9 kg)   SpO2 97%   BMI 38.38 kg/m   Physical Exam Vitals and nursing note reviewed. Exam conducted with a chaperone present.  Constitutional:      General: He is not in acute distress.    Appearance: Normal appearance. He is obese.  Eyes:     General:        Right eye: No discharge.        Left eye: No discharge.     Extraocular Movements: Extraocular movements intact.     Conjunctiva/sclera: Conjunctivae normal.  Cardiovascular:     Rate and Rhythm: Normal rate and regular rhythm.     Heart sounds: No murmur. No gallop.   Pulmonary:     Effort: Pulmonary effort is normal. No respiratory distress.  Breath sounds: Normal breath sounds. No stridor. No wheezing or rhonchi.  Abdominal:     General: Abdomen is flat. There is no distension.     Tenderness: There is no right CVA tenderness, left CVA tenderness, guarding or rebound.     Hernia: A hernia is present.     Comments: Loss of domain, reducible ventral hernia  Musculoskeletal:        General: No swelling or tenderness. Normal range of motion.     Cervical back: Normal range of motion and neck supple. No rigidity.  Lymphadenopathy:     Cervical: No cervical adenopathy.  Skin:    General: Skin is warm and dry.     Capillary Refill: Capillary refill takes less than 2 seconds.  Neurological:     General: No focal deficit present.     Mental Status: He is alert and oriented to person, place, and time.  Psychiatric:        Mood and Affect:  Mood normal.        Behavior: Behavior normal.        Thought Content: Thought content normal.        Judgment: Judgment normal.         Assessment/Plan:  [name] is a 69 year old male well-known to me with a ventral hernia with loss of domain as well as bilateral inguinal hernias..  He has been optimized from medical perspective and has lost significant weight. I do think that we will be able to perform the abdominal wall reconstruction and also include the bilateral inguinal hernia repair during the same operative setting.  Procedure discussed with the patient in detail.  Risk, benefits and possible complications including but not limited to: Bleeding, infection, recurrence, bowel injuries, chronic pain and prolonged hospitalization.  He understands and wishes to proceed. Greater than 50% of the 40 minutes  visit was spent in counseling/coordination of care   Caroleen Hamman, MD [facility]

## 2019-03-15 ENCOUNTER — Telehealth: Payer: Self-pay | Admitting: Surgery

## 2019-03-15 NOTE — Telephone Encounter (Signed)
Pt has been advised of pre admission date/time, Covid Testing date and Surgery date.  Surgery Date: 04/06/19 Preadmission Testing Date: 04/03/19 (8a-1p) Covid Testing Date: 04/04/19 - patient advised to go to the [facility] ([place])  Patient has been made aware to call [phone], between 1-3:00pm the day before surgery, to find out what time to arrive.

## 2019-04-03 ENCOUNTER — Encounter
Admission: RE | Admit: 2019-04-03 | Discharge: 2019-04-03 | Disposition: A | Discharge status: Home / Self Care | Payer: 59 | Source: Ambulatory Visit | Attending: Surgery | Admitting: Surgery

## 2019-04-03 ENCOUNTER — Other Ambulatory Visit: Payer: Self-pay

## 2019-04-03 DIAGNOSIS — I10 Essential (primary) hypertension: Secondary | ICD-10-CM | POA: Insufficient documentation

## 2019-04-03 DIAGNOSIS — Z01812 Encounter for preprocedural laboratory examination: Secondary | ICD-10-CM | POA: Insufficient documentation

## 2019-04-03 DIAGNOSIS — Z20822 Contact with and (suspected) exposure to covid-19: Secondary | ICD-10-CM | POA: Insufficient documentation

## 2019-04-03 DIAGNOSIS — Z01810 Encounter for preprocedural cardiovascular examination: Secondary | ICD-10-CM | POA: Insufficient documentation

## 2019-04-03 NOTE — Patient Instructions (Addendum)
INSTRUCTIONS FOR SURGERY     Your surgery is scheduled for:  Thursday, February 25TH       To find out your arrival time for the day of surgery,          please call [phone] between 1 pm and 3 pm on :  Wednesday, February 24TH     When you arrive for surgery, report to the [facility].       Do NOT stop on the first floor to register.    REMEMBER: Instructions that are not followed completely may result in serious medical risk,  up to and including death, or upon the discretion of your surgeon and anesthesiologist,            your surgery may need to be rescheduled.  __X__ 1. Do not eat food after midnight the night before your procedure.                    No gum, candy, lozenger, tic tacs, tums or hard candies.                  ABSOLUTELY NOTHING SOLID IN YOUR MOUTH AFTER MIDNIGHT                    You may drink unlimited clear liquids up to 2 hours before you are scheduled to arrive for surgery.                   Do not drink anything within those 2 hours unless you need to take medicine, then take the                   smallest amount you need.  Clear liquids include:  water, apple juice without pulp,                   any flavor Gatorade, Black coffee, black tea.  Sugar may be added but no dairy/ honey /lemon.                        Broth and jello is not considered a clear liquid.  __x__  2. On the morning of surgery, please brush your teeth with toothpaste and water. You may rinse with                  mouthwash if you wish but DO NOT SWALLOW TOOTHPASTE OR MOUTHWASH  __X___3. NO alcohol for 24 hours before or after surgery.  __x___ 4.  Do NOT smoke or use e-cigarettes for 24 HOURS PRIOR TO SURGERY.                      DO NOT Use any chewable tobacco products for at least 6 hours prior to surgery.  __x___ 5. If you start any new medication after this appointment and prior to surgery, please               Bring it with you on the day of surgery.  ___x__ 6. Notify your doctor if there is any change in your medical condition, such  as fever,                  infection, vomitting, diarrhea or any open sores.  __x___ 7.  USE the CHG SOAP as instructed, the night before surgery and the day of surgery.                   Once you have washed with this soap, do NOT use any of the following: Powders, perfumes                    or lotions. Please do not wear make up, hairpins, clips or nail polish. You MAY wear deodorant.                   Men may shave their face and neck.  Women need to shave 48 hours prior to surgery.                   DO NOT wear ANY jewelry on the day of surgery. If there are rings that are too tight to                    remove easily, please address this prior to the surgery day. Piercings need to be removed.                                                                     NO METAL ON YOUR BODY.                    Do NOT bring any valuables.  If you came to Pre-Admit testing then you will not need license,                     insurance card or credit card.  If you will be staying overnight, please either leave your things in                     the car or have your family be responsible for these items.                     [facility] IS NOT RESPONSIBLE FOR BELONGINGS OR VALUABLES.  ___X__ 8. DO NOT wear contact lenses on surgery day.  You may not have dentures,                     Hearing aides, contacts or glasses in the operating room. These items can be                    Placed in the Recovery Room to receive immediately after surgery.  ___x__ 10. Take the following medications on the morning of surgery with a sip of water:                              1.  NOTHING                     2.                     3.  __X___ 11.  Follow any instructions provided to you by your surgeon.                         Such as enema, clear liquid bowel prep                     @@COMPLETE  CARBOHYDRATE DRINK ON THE MORNING OF SURGERY.                         FINISH IT BY 2 HOURS PRIOR TO SURGERY.  __X__  12. STOP  ASPIRIN AS OF: Monday, February 22ND                       THIS INCLUDES BC POWDERS / GOODIES POWDER  __x___ 13. STOP Anti-inflammatories as of: Monday, February 22ND                      This includes IBUPROFEN / MOTRIN / ADVIL / ALEVE/ NAPROXYN                    YOU MAY TAKE TYLENOL ANY TIME PRIOR TO SURGERY.  _____ 14.  Stop supplements until after surgery.                     This includes:  N/A                 You may continue taking Vitamin B12 / Vitamin D3 but do not take on the morning of surgery.  ___X___17.  Continue to take the following medications but do not take on the morning of surgery:                          BENICAR  __X____18. If staying overnight, please have appropriate shoes to wear to be able to walk around the unit.                   Wear clean and comfortable clothing to the hospital.  Have stool softeners available at home to use once discharged. Bring cell phone and charger if you so desire.  Have a firm pillow to hold against your abdomen for support.

## 2019-04-03 NOTE — Addendum Note (Signed)
Addended by: Caroleen Hamman F on: 04/03/2019 11:12 AM   Modules accepted: Orders, SmartSet

## 2019-04-04 ENCOUNTER — Encounter
Admission: RE | Admit: 2019-04-04 | Discharge: 2019-04-04 | Disposition: A | Discharge status: Home / Self Care | Payer: 59 | Source: Ambulatory Visit | Attending: Surgery | Admitting: Surgery

## 2019-04-04 DIAGNOSIS — Z01810 Encounter for preprocedural cardiovascular examination: Secondary | ICD-10-CM | POA: Diagnosis not present

## 2019-04-04 LAB — CBC
HCT: 43.2 % (ref 39.0–52.0)
Hemoglobin: 14.4 g/dL (ref 13.0–17.0)
MCH: 29.4 pg (ref 26.0–34.0)
MCHC: 33.3 g/dL (ref 30.0–36.0)
MCV: 88.3 fL (ref 80.0–100.0)
Platelets: 192 10*3/uL (ref 150–400)
RBC: 4.89 MIL/uL (ref 4.22–5.81)
RDW: 12.5 % (ref 11.5–15.5)
WBC: 5.3 10*3/uL (ref 4.0–10.5)
nRBC: 0 % (ref 0.0–0.2)

## 2019-04-04 LAB — BASIC METABOLIC PANEL
Anion gap: 10 (ref 5–15)
BUN: 23 mg/dL (ref 8–23)
CO2: 26 mmol/L (ref 22–32)
Calcium: 9.6 mg/dL (ref 8.9–10.3)
Chloride: 104 mmol/L (ref 98–111)
Creatinine, Ser: 0.85 mg/dL (ref 0.61–1.24)
GFR calc Af Amer: >60 mL/min (ref >60)
GFR calc non Af Amer: >60 mL/min (ref >60)
Glucose, Bld: 101 mg/dL — ABNORMAL HIGH (ref 70–99)
Potassium: 4.2 mmol/L (ref 3.5–5.1)
Sodium: 140 mmol/L (ref 135–145)

## 2019-04-04 LAB — SARS CORONAVIRUS 2 (TAT 6-24 HRS): SARS Coronavirus 2: NEGATIVE

## 2019-04-06 ENCOUNTER — Encounter: Payer: Self-pay | Admitting: Surgery

## 2019-04-06 ENCOUNTER — Ambulatory Visit: Payer: 59 | Admitting: Internal Medicine

## 2019-04-06 ENCOUNTER — Inpatient Hospital Stay
Admission: RE | Admit: 2019-04-06 | Discharge: 2019-04-10 | DRG: 336 | Disposition: A | Discharge status: Home / Self Care | Payer: 59 | Attending: Surgery | Admitting: Surgery

## 2019-04-06 ENCOUNTER — Encounter
Admission: RE | Disposition: A | Discharge status: Home / Self Care | Payer: Self-pay | Source: Home / Self Care | Attending: Surgery

## 2019-04-06 ENCOUNTER — Inpatient Hospital Stay: Payer: 59 | Admitting: Anesthesiology

## 2019-04-06 ENCOUNTER — Other Ambulatory Visit: Payer: Self-pay

## 2019-04-06 DIAGNOSIS — R0602 Shortness of breath: Secondary | ICD-10-CM | POA: Diagnosis not present

## 2019-04-06 DIAGNOSIS — K439 Ventral hernia without obstruction or gangrene: Secondary | ICD-10-CM | POA: Diagnosis present

## 2019-04-06 DIAGNOSIS — K4020 Bilateral inguinal hernia, without obstruction or gangrene, not specified as recurrent: Secondary | ICD-10-CM | POA: Diagnosis present

## 2019-04-06 DIAGNOSIS — K660 Peritoneal adhesions (postprocedural) (postinfection): Secondary | ICD-10-CM | POA: Diagnosis present

## 2019-04-06 DIAGNOSIS — I10 Essential (primary) hypertension: Secondary | ICD-10-CM | POA: Diagnosis present

## 2019-04-06 DIAGNOSIS — Z9079 Acquired absence of other genital organ(s): Secondary | ICD-10-CM

## 2019-04-06 DIAGNOSIS — Z803 Family history of malignant neoplasm of breast: Secondary | ICD-10-CM | POA: Diagnosis not present

## 2019-04-06 DIAGNOSIS — Z6838 Body mass index (BMI) 38.0-38.9, adult: Secondary | ICD-10-CM | POA: Diagnosis not present

## 2019-04-06 DIAGNOSIS — Z933 Colostomy status: Secondary | ICD-10-CM

## 2019-04-06 DIAGNOSIS — Z9889 Other specified postprocedural states: Secondary | ICD-10-CM

## 2019-04-06 DIAGNOSIS — Z87891 Personal history of nicotine dependence: Secondary | ICD-10-CM | POA: Diagnosis not present

## 2019-04-06 DIAGNOSIS — R0600 Dyspnea, unspecified: Secondary | ICD-10-CM | POA: Diagnosis not present

## 2019-04-06 DIAGNOSIS — E669 Obesity, unspecified: Secondary | ICD-10-CM | POA: Diagnosis present

## 2019-04-06 DIAGNOSIS — Z8719 Personal history of other diseases of the digestive system: Secondary | ICD-10-CM

## 2019-04-06 DIAGNOSIS — Z91048 Other nonmedicinal substance allergy status: Secondary | ICD-10-CM | POA: Diagnosis not present

## 2019-04-06 DIAGNOSIS — K567 Ileus, unspecified: Secondary | ICD-10-CM | POA: Diagnosis not present

## 2019-04-06 DIAGNOSIS — M19011 Primary osteoarthritis, right shoulder: Secondary | ICD-10-CM | POA: Diagnosis present

## 2019-04-06 DIAGNOSIS — Z8546 Personal history of malignant neoplasm of prostate: Secondary | ICD-10-CM

## 2019-04-06 DIAGNOSIS — Z20822 Contact with and (suspected) exposure to covid-19: Secondary | ICD-10-CM | POA: Diagnosis present

## 2019-04-06 DIAGNOSIS — Z800 Family history of malignant neoplasm of digestive organs: Secondary | ICD-10-CM | POA: Diagnosis not present

## 2019-04-06 HISTORY — PX: ABDOMINAL WALL DEFECT REPAIR: SHX53

## 2019-04-06 HISTORY — PX: VENTRAL HERNIA REPAIR: SHX424

## 2019-04-06 HISTORY — PX: APPENDECTOMY: SHX54

## 2019-04-06 HISTORY — PX: INGUINAL HERNIA REPAIR: SHX194

## 2019-04-06 LAB — CBC
HCT: 40.5 % (ref 39.0–52.0)
Hemoglobin: 13.6 g/dL (ref 13.0–17.0)
MCH: 30.2 pg (ref 26.0–34.0)
MCHC: 33.6 g/dL (ref 30.0–36.0)
MCV: 90 fL (ref 80.0–100.0)
Platelets: 171 10*3/uL (ref 150–400)
RBC: 4.5 MIL/uL (ref 4.22–5.81)
RDW: 12.6 % (ref 11.5–15.5)
WBC: 12.3 10*3/uL — ABNORMAL HIGH (ref 4.0–10.5)
nRBC: 0 % (ref 0.0–0.2)

## 2019-04-06 LAB — CREATININE, SERUM
Creatinine, Ser: 1.1 mg/dL (ref 0.61–1.24)
GFR calc Af Amer: >60 mL/min (ref >60)
GFR calc non Af Amer: >60 mL/min (ref >60)

## 2019-04-06 LAB — HIV ANTIBODY (ROUTINE TESTING W REFLEX): HIV Screen 4th Generation wRfx: NONREACTIVE

## 2019-04-06 SURGERY — REPAIR, HERNIA, VENTRAL
Anesthesia: General | Site: Inguinal

## 2019-04-06 MED ORDER — BUPIVACAINE LIPOSOME 1.3 % IJ SUSP
INTRAMUSCULAR | Status: AC
  Filled 2019-04-06: qty 20

## 2019-04-06 MED ORDER — SUGAMMADEX SODIUM 200 MG/2ML IV SOLN
INTRAVENOUS | Status: DC | PRN
  Administered 2019-04-06: 217.8 mg via INTRAVENOUS

## 2019-04-06 MED ORDER — MIDAZOLAM HCL 2 MG/2ML IJ SOLN
INTRAMUSCULAR | Status: DC | PRN
  Administered 2019-04-06: 2 mg via INTRAVENOUS

## 2019-04-06 MED ORDER — SUCCINYLCHOLINE CHLORIDE 20 MG/ML IJ SOLN
INTRAMUSCULAR | Status: DC | PRN
  Administered 2019-04-06: 120 mg via INTRAVENOUS

## 2019-04-06 MED ORDER — MORPHINE SULFATE (PF) 2 MG/ML IV SOLN: 2.0000 mg | INTRAVENOUS | Status: DC | PRN

## 2019-04-06 MED ORDER — ACETAMINOPHEN 500 MG PO TABS: 1000.0000 mg | ORAL_TABLET | ORAL | Status: AC

## 2019-04-06 MED ORDER — FENTANYL CITRATE (PF) 100 MCG/2ML IJ SOLN
INTRAMUSCULAR | Status: AC
  Filled 2019-04-06: qty 2

## 2019-04-06 MED ORDER — GABAPENTIN 600 MG PO TABS
300.0000 mg | ORAL_TABLET | Freq: Three times a day (TID) | ORAL | Status: DC
  Administered 2019-04-06 – 2019-04-10 (×12): 300 mg via ORAL
  Filled 2019-04-06 (×12): qty 1

## 2019-04-06 MED ORDER — SEVOFLURANE IN SOLN
RESPIRATORY_TRACT | Status: AC
  Filled 2019-04-06: qty 250

## 2019-04-06 MED ORDER — PROPOFOL 10 MG/ML IV BOLUS
INTRAVENOUS | Status: AC
  Filled 2019-04-06: qty 20

## 2019-04-06 MED ORDER — HYDRALAZINE HCL 20 MG/ML IJ SOLN: 10.0000 mg | INTRAMUSCULAR | Status: DC | PRN

## 2019-04-06 MED ORDER — ACETAMINOPHEN 500 MG PO TABS
ORAL_TABLET | ORAL | Status: AC
  Administered 2019-04-06: 06:00:00 1000 mg via ORAL
  Filled 2019-04-06: qty 2

## 2019-04-06 MED ORDER — ACETAMINOPHEN 10 MG/ML IV SOLN
INTRAVENOUS | Status: DC | PRN
  Administered 2019-04-06: 1000 mg via INTRAVENOUS

## 2019-04-06 MED ORDER — CEFAZOLIN SODIUM-DEXTROSE 2-4 GM/100ML-% IV SOLN
2.0000 g | INTRAVENOUS | Status: AC
  Administered 2019-04-06 (×2): 2 g via INTRAVENOUS

## 2019-04-06 MED ORDER — CEFAZOLIN SODIUM-DEXTROSE 2-4 GM/100ML-% IV SOLN
2.0000 g | Freq: Three times a day (TID) | INTRAVENOUS | Status: AC
  Administered 2019-04-06 – 2019-04-07 (×2): 2 g via INTRAVENOUS
  Filled 2019-04-06 (×2): qty 100

## 2019-04-06 MED ORDER — ROCURONIUM BROMIDE 100 MG/10ML IV SOLN
INTRAVENOUS | Status: DC | PRN
  Administered 2019-04-06 (×2): 10 mg via INTRAVENOUS
  Administered 2019-04-06: 20 mg via INTRAVENOUS
  Administered 2019-04-06: 10 mg via INTRAVENOUS
  Administered 2019-04-06 (×2): 20 mg via INTRAVENOUS
  Administered 2019-04-06: 30 mg via INTRAVENOUS
  Administered 2019-04-06 (×2): 10 mg via INTRAVENOUS
  Administered 2019-04-06: 40 mg via INTRAVENOUS

## 2019-04-06 MED ORDER — PHENYLEPHRINE HCL (PRESSORS) 10 MG/ML IV SOLN
INTRAVENOUS | Status: DC | PRN
  Administered 2019-04-06 (×3): 100 ug via INTRAVENOUS
  Administered 2019-04-06: 200 ug via INTRAVENOUS
  Administered 2019-04-06 (×3): 100 ug via INTRAVENOUS

## 2019-04-06 MED ORDER — BUPIVACAINE-EPINEPHRINE 0.25% -1:200000 IJ SOLN
INTRAMUSCULAR | Status: DC | PRN
  Administered 2019-04-06: 30 mL

## 2019-04-06 MED ORDER — LIDOCAINE HCL (CARDIAC) PF 100 MG/5ML IV SOSY
PREFILLED_SYRINGE | INTRAVENOUS | Status: DC | PRN
  Administered 2019-04-06 (×2): 100 mg via INTRAVENOUS

## 2019-04-06 MED ORDER — PNEUMOCOCCAL VAC POLYVALENT 25 MCG/0.5ML IJ INJ: 0.5000 mL | INJECTION | INTRAMUSCULAR | Status: DC

## 2019-04-06 MED ORDER — ACETAMINOPHEN 10 MG/ML IV SOLN
INTRAVENOUS | Status: AC
  Filled 2019-04-06: qty 100

## 2019-04-06 MED ORDER — CHLORHEXIDINE GLUCONATE CLOTH 2 % EX PADS: 6.0000 | MEDICATED_PAD | Freq: Every day | CUTANEOUS | Status: DC

## 2019-04-06 MED ORDER — HEPARIN SODIUM (PORCINE) 5000 UNIT/ML IJ SOLN
INTRAMUSCULAR | Status: AC
  Administered 2019-04-06: 5000 [IU] via SUBCUTANEOUS
  Filled 2019-04-06: qty 1

## 2019-04-06 MED ORDER — GABAPENTIN 300 MG PO CAPS: 300.0000 mg | ORAL_CAPSULE | ORAL | Status: AC

## 2019-04-06 MED ORDER — ONDANSETRON 4 MG PO TBDP: 4.0000 mg | ORAL_TABLET | Freq: Four times a day (QID) | ORAL | Status: DC | PRN

## 2019-04-06 MED ORDER — SODIUM CHLORIDE 0.9 % IV SOLN
INTRAVENOUS | Status: DC | PRN
  Administered 2019-04-06: 13:00:00 70 mL

## 2019-04-06 MED ORDER — CELECOXIB 200 MG PO CAPS: 200.0000 mg | ORAL_CAPSULE | ORAL | Status: AC

## 2019-04-06 MED ORDER — SODIUM CHLORIDE 0.9 % IV SOLN: INTRAVENOUS | Status: DC | PRN

## 2019-04-06 MED ORDER — PROCHLORPERAZINE EDISYLATE 10 MG/2ML IJ SOLN: 5.0000 mg | Freq: Four times a day (QID) | INTRAMUSCULAR | Status: DC | PRN

## 2019-04-06 MED ORDER — GLYCOPYRROLATE 0.2 MG/ML IJ SOLN
INTRAMUSCULAR | Status: DC | PRN
  Administered 2019-04-06: .2 mg via INTRAVENOUS

## 2019-04-06 MED ORDER — SODIUM CHLORIDE (PF) 0.9 % IJ SOLN
INTRAMUSCULAR | Status: AC
  Filled 2019-04-06: qty 50

## 2019-04-06 MED ORDER — DEXAMETHASONE SODIUM PHOSPHATE 10 MG/ML IJ SOLN
INTRAMUSCULAR | Status: DC | PRN
  Administered 2019-04-06: 10 mg via INTRAVENOUS

## 2019-04-06 MED ORDER — OXYCODONE HCL 5 MG PO TABS: 5.0000 mg | ORAL_TABLET | Freq: Once | ORAL | Status: DC | PRN

## 2019-04-06 MED ORDER — CELECOXIB 200 MG PO CAPS
ORAL_CAPSULE | ORAL | Status: AC
  Administered 2019-04-06: 06:00:00 200 mg via ORAL
  Filled 2019-04-06: qty 1

## 2019-04-06 MED ORDER — ENOXAPARIN SODIUM 40 MG/0.4ML ~~LOC~~ SOLN
40.0000 mg | SUBCUTANEOUS | Status: DC
  Administered 2019-04-07 – 2019-04-10 (×4): 40 mg via SUBCUTANEOUS
  Filled 2019-04-06 (×4): qty 0.4

## 2019-04-06 MED ORDER — ONDANSETRON HCL 4 MG/2ML IJ SOLN
INTRAMUSCULAR | Status: DC | PRN
  Administered 2019-04-06 (×2): 4 mg via INTRAVENOUS

## 2019-04-06 MED ORDER — OXYCODONE HCL 5 MG PO TABS: 5.0000 mg | ORAL_TABLET | ORAL | Status: DC | PRN

## 2019-04-06 MED ORDER — SODIUM CHLORIDE 0.9 % IV SOLN
INTRAVENOUS | Status: DC | PRN
  Administered 2019-04-06: 30 ug/min via INTRAVENOUS

## 2019-04-06 MED ORDER — KETAMINE HCL 50 MG/ML IJ SOLN
INTRAMUSCULAR | Status: DC | PRN
  Administered 2019-04-06: 25 mg via INTRAMUSCULAR
  Administered 2019-04-06 (×2): 50 mg via INTRAMUSCULAR
  Administered 2019-04-06: 25 mg via INTRAMUSCULAR

## 2019-04-06 MED ORDER — KETOROLAC TROMETHAMINE 30 MG/ML IJ SOLN
INTRAMUSCULAR | Status: DC | PRN
  Administered 2019-04-06: 30 mg via INTRAVENOUS

## 2019-04-06 MED ORDER — HYDROMORPHONE HCL 1 MG/ML IJ SOLN
INTRAMUSCULAR | Status: AC
  Filled 2019-04-06: qty 1

## 2019-04-06 MED ORDER — PROPOFOL 10 MG/ML IV BOLUS
INTRAVENOUS | Status: DC | PRN
  Administered 2019-04-06: 200 mg via INTRAVENOUS

## 2019-04-06 MED ORDER — KETOROLAC TROMETHAMINE 15 MG/ML IJ SOLN
15.0000 mg | Freq: Four times a day (QID) | INTRAMUSCULAR | Status: DC
  Administered 2019-04-06 – 2019-04-10 (×16): 15 mg via INTRAVENOUS
  Filled 2019-04-06 (×16): qty 1

## 2019-04-06 MED ORDER — EPHEDRINE SULFATE 50 MG/ML IJ SOLN
INTRAMUSCULAR | Status: DC | PRN
  Administered 2019-04-06 (×2): 10 mg via INTRAVENOUS
  Administered 2019-04-06 (×2): 5 mg via INTRAVENOUS
  Administered 2019-04-06: 10 mg via INTRAVENOUS

## 2019-04-06 MED ORDER — ONDANSETRON HCL 4 MG/2ML IJ SOLN: 4.0000 mg | Freq: Four times a day (QID) | INTRAMUSCULAR | Status: DC | PRN

## 2019-04-06 MED ORDER — MIDAZOLAM HCL 2 MG/2ML IJ SOLN
INTRAMUSCULAR | Status: AC
  Filled 2019-04-06: qty 2

## 2019-04-06 MED ORDER — HEPARIN SODIUM (PORCINE) 5000 UNIT/ML IJ SOLN: 5000.0000 [IU] | Freq: Once | INTRAMUSCULAR | Status: AC

## 2019-04-06 MED ORDER — PROCHLORPERAZINE MALEATE 10 MG PO TABS
10.0000 mg | ORAL_TABLET | Freq: Four times a day (QID) | ORAL | Status: DC | PRN
  Filled 2019-04-06: qty 1

## 2019-04-06 MED ORDER — INFLUENZA VAC A&B SA ADJ QUAD 0.5 ML IM PRSY
0.5000 mL | PREFILLED_SYRINGE | INTRAMUSCULAR | Status: DC
  Filled 2019-04-06: qty 0.5

## 2019-04-06 MED ORDER — CHLORHEXIDINE GLUCONATE CLOTH 2 % EX PADS: 6.0000 | MEDICATED_PAD | Freq: Once | CUTANEOUS | Status: DC

## 2019-04-06 MED ORDER — EPINEPHRINE PF 1 MG/ML IJ SOLN
INTRAMUSCULAR | Status: AC
  Filled 2019-04-06: qty 1

## 2019-04-06 MED ORDER — FENTANYL CITRATE (PF) 100 MCG/2ML IJ SOLN
INTRAMUSCULAR | Status: DC | PRN
  Administered 2019-04-06 (×3): 50 ug via INTRAVENOUS
  Administered 2019-04-06 (×2): 25 ug via INTRAVENOUS

## 2019-04-06 MED ORDER — CEFAZOLIN SODIUM-DEXTROSE 2-4 GM/100ML-% IV SOLN
INTRAVENOUS | Status: AC
  Filled 2019-04-06: qty 100

## 2019-04-06 MED ORDER — LACTATED RINGERS IV SOLN: INTRAVENOUS | Status: DC

## 2019-04-06 MED ORDER — ROCURONIUM BROMIDE 50 MG/5ML IV SOLN
INTRAVENOUS | Status: AC
  Filled 2019-04-06: qty 1

## 2019-04-06 MED ORDER — PANTOPRAZOLE SODIUM 40 MG IV SOLR
40.0000 mg | Freq: Every day | INTRAVENOUS | Status: DC
  Administered 2019-04-06 – 2019-04-09 (×4): 40 mg via INTRAVENOUS
  Filled 2019-04-06 (×4): qty 40

## 2019-04-06 MED ORDER — BUPIVACAINE HCL (PF) 0.25 % IJ SOLN
INTRAMUSCULAR | Status: AC
  Filled 2019-04-06: qty 30

## 2019-04-06 MED ORDER — SODIUM CHLORIDE 0.9 % IV SOLN: INTRAVENOUS | Status: DC

## 2019-04-06 MED ORDER — FENTANYL CITRATE (PF) 100 MCG/2ML IJ SOLN: 25.0000 ug | INTRAMUSCULAR | Status: DC | PRN

## 2019-04-06 MED ORDER — ACETAMINOPHEN 500 MG PO TABS
1000.0000 mg | ORAL_TABLET | Freq: Four times a day (QID) | ORAL | Status: DC
  Administered 2019-04-06 – 2019-04-10 (×13): 1000 mg via ORAL
  Filled 2019-04-06 (×16): qty 2

## 2019-04-06 MED ORDER — OXYCODONE HCL 5 MG/5ML PO SOLN: 5.0000 mg | Freq: Once | ORAL | Status: DC | PRN

## 2019-04-06 MED ORDER — KETAMINE HCL 50 MG/ML IJ SOLN
INTRAMUSCULAR | Status: AC
  Filled 2019-04-06: qty 10

## 2019-04-06 MED ORDER — FAMOTIDINE 20 MG PO TABS
ORAL_TABLET | ORAL | Status: AC
  Administered 2019-04-06: 20 mg via ORAL
  Filled 2019-04-06: qty 1

## 2019-04-06 MED ORDER — HYDROMORPHONE HCL 1 MG/ML IJ SOLN
INTRAMUSCULAR | Status: DC | PRN
  Administered 2019-04-06 (×2): .5 mg via INTRAVENOUS

## 2019-04-06 MED ORDER — FAMOTIDINE 20 MG PO TABS: 20.0000 mg | ORAL_TABLET | Freq: Once | ORAL | Status: AC

## 2019-04-06 MED ORDER — GABAPENTIN 300 MG PO CAPS
ORAL_CAPSULE | ORAL | Status: AC
  Administered 2019-04-06: 06:00:00 300 mg via ORAL
  Filled 2019-04-06: qty 1

## 2019-04-06 SURGICAL SUPPLY — 53 items
APPLIER CLIP 11 MED OPEN (CLIP)
APPLIER CLIP 13 LRG OPEN (CLIP)
BARRIER ADH SEPRAFILM 3INX5IN (MISCELLANEOUS) ×2 IMPLANT
BLADE CLIPPER SURG (BLADE) ×4 IMPLANT
BULB RESERV EVAC DRAIN JP 100C (MISCELLANEOUS) ×1 IMPLANT
CANISTER SUCT 3000ML PPV (MISCELLANEOUS) ×4 IMPLANT
CHLORAPREP W/TINT 26 (MISCELLANEOUS) ×4 IMPLANT
CLIP APPLIE 11 MED OPEN (CLIP) ×3 IMPLANT
CLIP APPLIE 13 LRG OPEN (CLIP) ×3 IMPLANT
COVER LIGHT HANDLE STERIS (MISCELLANEOUS) ×1 IMPLANT
COVER WAND RF STERILE (DRAPES) ×4 IMPLANT
DRAIN CHANNEL JP 19F (MISCELLANEOUS) ×1 IMPLANT
DRAPE LAPAROTOMY 100X77 ABD (DRAPES) ×4 IMPLANT
DRSG TELFA 3X8 NADH (GAUZE/BANDAGES/DRESSINGS) IMPLANT
ELECT BLADE 6.5 EXT (BLADE) ×4 IMPLANT
ELECT CAUTERY BLADE 6.4 (BLADE) ×4 IMPLANT
ELECT REM PT RETURN 9FT ADLT (ELECTROSURGICAL) ×4
ELECTRODE REM PT RTRN 9FT ADLT (ELECTROSURGICAL) ×3 IMPLANT
GAUZE SPONGE 4X4 12PLY STRL (GAUZE/BANDAGES/DRESSINGS) ×3 IMPLANT
GLOVE BIO SURGEON STRL SZ7 (GLOVE) ×7 IMPLANT
GOWN STRL REUS W/ TWL LRG LVL3 (GOWN DISPOSABLE) ×9 IMPLANT
GOWN STRL REUS W/TWL LRG LVL3 (GOWN DISPOSABLE) ×4
KIT TURNOVER KIT A (KITS) ×4 IMPLANT
MESH BARD SOFT 6X6IN (Mesh General) ×2 IMPLANT
MESH SOFT 12X12IN BARD (Mesh General) ×1 IMPLANT
NDL HYPO 25X1 1.5 SAFETY (NEEDLE) ×3 IMPLANT
NEEDLE HYPO 22GX1.5 SAFETY (NEEDLE) ×3 IMPLANT
NEEDLE HYPO 25X1 1.5 SAFETY (NEEDLE) IMPLANT
NS IRRIG 1000ML POUR BTL (IV SOLUTION) ×4 IMPLANT
PACK BASIN MAJOR ARMC (MISCELLANEOUS) ×4 IMPLANT
PACK BASIN MINOR ARMC (MISCELLANEOUS) ×3 IMPLANT
PAD DRESSING TELFA 3X8 NADH (GAUZE/BANDAGES/DRESSINGS) ×3 IMPLANT
PREVENA INCISION MGT 90 150 (MISCELLANEOUS) ×4 IMPLANT
SLEEVE PROTECTION STRL DISP (MISCELLANEOUS) ×1 IMPLANT
SPONGE DRAIN TRACH 4X4 STRL 2S (GAUZE/BANDAGES/DRESSINGS) ×2 IMPLANT
SPONGE KITTNER 5P (MISCELLANEOUS) ×8 IMPLANT
SPONGE LAP 18X18 RF (DISPOSABLE) ×9 IMPLANT
STAPLER PROXIMATE 55 BLUE (STAPLE) ×1 IMPLANT
STAPLER SKIN PROX 35W (STAPLE) ×4 IMPLANT
SUT ETHIBOND 0 MO6 C/R (SUTURE) ×3 IMPLANT
SUT ETHILON 3-0 FS-10 30 BLK (SUTURE) ×4
SUT PDS AB 0 CT1 27 (SUTURE) ×17 IMPLANT
SUT SILK 2 0 (SUTURE) ×2
SUT SILK 2 0 SH CR/8 (SUTURE) ×5 IMPLANT
SUT SILK 2-0 18XBRD TIE 12 (SUTURE) ×3 IMPLANT
SUT VIC AB 2-0 SH 27 (SUTURE) ×2
SUT VIC AB 2-0 SH 27XBRD (SUTURE) ×6 IMPLANT
SUTURE EHLN 3-0 FS-10 30 BLK (SUTURE) IMPLANT
SYR 20ML LL LF (SYRINGE) ×8 IMPLANT
SYR BULB IRRIG 60ML STRL (SYRINGE) ×1 IMPLANT
TAPE MICROFOAM 4IN (TAPE) ×3 IMPLANT
TRAY FOLEY SLVR 16FR LF STAT (SET/KITS/TRAYS/PACK) ×4 IMPLANT
WATER STERILE IRR 1000ML POUR (IV SOLUTION) ×3 IMPLANT

## 2019-04-06 NOTE — Op Note (Signed)
PROCEDURES: 1.Lysis of adhesions 2. Excisional debridement abdominal wall measuring 30x3 cm= 90cm2 including fascia 3. Appendectomy 4. Abdominal wall reconstruction  ( ventral hernia repair ) with bilateral Myocutaneous flaps, TAR release Bilaterally using 30.5x30.5 cms Phasix Mesh in a Diamond configuration 5. Bilateral Inguinal Hernia repair w 15 x 15 cm Polypropylene soft Mesh 6. Placement of Prevena wound VAC 90cm2   Pre-operative Diagnosis: complex ventral hernia and bilateral inguinal hernias  Post-operative Diagnosis: same  Surgeon: Cloverleaf   Assistants: Olean Ree MD required due to the complexity of the case  Otho Ket PA-C   Anesthesia: General endotracheal anesthesia  ASA Class: 2  Surgeon: Caroleen Hamman , MD FACS  Anesthesia: Gen. with endotracheal tube  Findings: 1. Bilateral Inguinal Hernias Indirect 2. Large Ventral hernia swiss cheese defect measuring 9x9 cms causing loss of domain  Estimated Blood Loss: 100cc         Drains: 19 Blake retrorectus space x 2.         Specimens:  Subq tissue including fascia abd wall       Complications: None              Condition: stable  Procedure Details  The patient was seen again in the Holding Room. The benefits, complications, treatment options, and expected outcomes were discussed with the patient. The risks of bleeding, infection, recurrence of symptoms, failure to resolve symptoms,  bowel injury, any of which could require further surgery were reviewed with the patient.   The patient was taken to Operating Room, identified and the procedure verified.  A Time Out was held and the above information confirmed.  Prior to the induction of general anesthesia, antibiotic prophylaxis was administered. VTE prophylaxis was in place. General endotracheal anesthesia was then administered and tolerated well. 2-0 silk was used to pursestring the end colostomy.  After the induction, the abdomen was prepped  with Chloraprep and draped in the sterile fashion. The patient was positioned in lithotomy position.  Elliptical incision was created in the midline of the abdomen.  Due to chronic inflammatory response and the scar tissue I decided to perform an excisional debridement this was using a 10 blade knife to create an elliptical incision we use our electrocautery to dissect through subcutaneous tissue and the excised some of the scar tissue to include the midline fascia to freshen the edges of the fascia. We  Were extremely careful open opening the abdominal cavity and we did in a very systematic and cautious fashion.  We started our incision of the abdominal cavity on the subxiphoid area.  Encountered dense adhesions from the ventral to the abdominal wall and also small bowel to the abdominal wall. We Were able to lyse adhesions with a combination of cautery and Metzenbaum scissors.  Please note that we spent at least 1 /2 hour lysing adhesions.  Once we were able to restore the appropriate anatomy we identified the appendix and mesoappendix divided, using 74mm staple I removed the appendix.  Attention then was turned to the abdominal fascia.  We started our dissection on the left side where an incision was created in the posterior sheath and we developed the retrorectus space. We  Were able to identify the transversus abdominal muscle and perform a transverse abdominal release starting on the cephalad aspect.  We released the internal Lamella of the internal oblique followed by full transverse abdominal to release from xiphoid to pubis on the left side.  There was a great space in the  left side that allow [place] to bring the fascia midline under no tension.  We Were able to identify the transversus abdominal muscle and perform a transverse abdominal release starting on the cephalad aspect. Then we turned our attention to the right side We released the internal Lamella of the internal oblique followed by full  transverse abdominal to release from xiphoid to pubis on the left side.  There was a great space in the right side that allow [place] to bring the fascia midline under no tension.    We Made sure that the superior and inferior aspects where on the retrorectus  and on the retropubic space respectively. Please note that we preserved the perforators on both sides. Additionally we developed a space in the retzius space and reduce bilateral inguinal hernias, sac was reduced and cord structures preserved.  Two 6x6 inches mesh were placed on each side of the inguinal area to cover the direct, indirect and femoral spaces. Both meshed were secured medially to the pubic tubercle on each side.   Posterior rectus sheath was closed with 2 running 0 PDS sutures.  A second look showed no evidence of any bleeding or any other injuries. Ureters were identified as well as the bladder and they were protected at all times. A large mesh 30.5 x 30.5 cm was used in a diamond configuration for peritoneal reinforcement.   two 81 Blake drain was placed in the retrorectus space on each side.  We fixed the mesh to the xiphoid and also to the pubic bone.  Liposomal  Marcaine was injected throughout the abdominal wall bilaterally under direct visualization. We changed gloves and place a new tray to close the abdomen with a 0 PDS suture in a running fashion for the anterior sheath and the skin was closed with Staples.  We Tailored a  Prevena wound VAC to incorporate the generous midline laparotomy as well as to incorporate the defect for the prior colostomy.  The drain was secured to the abdominal wall using a 3-0 nylon suture. Needle and laparotomy count were correct and there were no immediate complications.    Caroleen Hamman, MD, FACS

## 2019-04-06 NOTE — Progress Notes (Signed)
abd rounded but soft

## 2019-04-06 NOTE — Interval H&P Note (Signed)
History and Physical Interval Note:  04/06/2019 7:18 AM  Karl Gentry  has presented today for surgery, with the diagnosis of ventral hernia.  The various methods of treatment have been discussed with the patient and family. After consideration of risks, benefits and other options for treatment, the patient has consented to  Procedure(s): HERNIA REPAIR VENTRAL ADULT (N/A) REPAIR ABDOMINAL WALL (N/A) as a surgical intervention.  The patient's history has been reviewed, patient examined, no change in status, stable for surgery.  I have reviewed the patient's chart and labs.  Questions were answered to the patient's satisfaction.     Farina

## 2019-04-06 NOTE — OR Nursing (Signed)
Dr. Dahlia Byes asked for 2 pieces of small Sepra Film to be opened. He didn't use either one of them. I wasted them under supplies.

## 2019-04-06 NOTE — Transfer of Care (Signed)
Immediate Anesthesia Transfer of Care Note  Patient: Kiyoshi Scherf Lines  Procedure(s) Performed: HERNIA REPAIR VENTRAL ADULT (N/A ) REPAIR ABDOMINAL WALL (N/A ) APPENDECTOMY HERNIA REPAIR INGUINAL ADULT BILATERAL (Bilateral Inguinal)  Patient Location: PACU  Anesthesia Type:General  Level of Consciousness: sedated  Airway & Oxygen Therapy: Patient Spontanous Breathing and Patient connected to face mask oxygen  Post-op Assessment: Report given to RN and Post -op Vital signs reviewed and stable  Post vital signs: Reviewed and stable  Last Vitals:  Vitals Value Taken Time  BP 111/79 04/06/19 1407  Temp 36 C 04/06/19 1405  Pulse 88 04/06/19 1409  Resp 11 04/06/19 1409  SpO2 97 % 04/06/19 1409  Vitals shown include unvalidated device data.  Last Pain:  Vitals:   04/06/19 0617  TempSrc: Tympanic  PainSc: 1          Complications: No apparent anesthesia complications

## 2019-04-06 NOTE — Anesthesia Preprocedure Evaluation (Addendum)
Anesthesia Evaluation  Patient identified by MRN, date of birth, ID band Patient awake    Reviewed: Allergy & Precautions, H&P , NPO status , Patient's Chart, lab work & pertinent test results, reviewed documented beta blocker date and time   Airway Mallampati: II  TM Distance: >3 FB Neck ROM: Full    Dental  (+) Teeth Intact   Pulmonary neg pulmonary ROS, former smoker,    breath sounds clear to auscultation       Cardiovascular hypertension, Pt. on medications  Rhythm:Regular Rate:Normal  Denies cardiac symptoms   Neuro/Psych negative neurological ROS  negative psych ROS   GI/Hepatic negative GI ROS, Neg liver ROS,   Endo/Other  negative endocrine ROS  Renal/GU negative Renal ROS   Prostate cancer    Musculoskeletal  (+) Arthritis ,   Abdominal   Peds negative pediatric ROS (+)  Hematology negative hematology ROS (+)   Anesthesia Other Findings Past Medical History: No date: Abdominal pain No date: Arthritis     Comment:  right shoulder, no cartillage 04-13-11: Cancer (HCC)     Comment:  recent dx. prostate cancer, surgery planned No date: ED (erectile dysfunction) 2021: History of elevated PSA     Comment:  PSA starting to rise again. unable to get all of cancer               during surgery No date: Hypertension 04-13-11: Inguinal hernia bilateral, non-recurrent     Comment:  states bilateral at present / umbilical hernia also [id number]: Prostate cancer [facility])     Comment:  bx=Adenocarcinoma,Gleason=4+3=7,PSA=5.5,volume  =36cc 03/02/2012-04/20/2012: Radiation     Comment:  prostate bed 6600 cGy No date: Stress incontinence, male  Reproductive/Obstetrics negative OB ROS                            Anesthesia Physical  Anesthesia Plan  ASA: II  Anesthesia Plan: General   Post-op Pain Management:    Induction: Intravenous  PONV Risk Score and Plan:   Airway Management  Planned: Oral ETT  Additional Equipment:   Intra-op Plan:   Post-operative Plan: Extubation in OR  Informed Consent:     Dental advisory given  Plan Discussed with: CRNA and Surgeon  Anesthesia Plan Comments:         Anesthesia Quick Evaluation

## 2019-04-06 NOTE — Anesthesia Procedure Notes (Signed)
Procedure Name: Intubation Date/Time: 04/06/2019 7:40 AM Performed by: Nelda Marseille, CRNA Pre-anesthesia Checklist: Patient identified, Patient being monitored, Timeout performed, Emergency Drugs available and Suction available Patient Re-evaluated:Patient Re-evaluated prior to induction Oxygen Delivery Method: Circle system utilized Preoxygenation: Pre-oxygenation with 100% oxygen Induction Type: IV induction Ventilation: Mask ventilation without difficulty Laryngoscope Size: Mac, 3 and McGraph Grade View: Grade I Tube type: Oral Tube size: 7.5 mm Number of attempts: 1 Airway Equipment and Method: Stylet Placement Confirmation: ETT inserted through vocal cords under direct vision,  positive ETCO2 and breath sounds checked- equal and bilateral Secured at: 21 cm Tube secured with: Tape Dental Injury: Teeth and Oropharynx as per pre-operative assessment

## 2019-04-07 LAB — BASIC METABOLIC PANEL
Anion gap: 7 (ref 5–15)
BUN: 21 mg/dL (ref 8–23)
CO2: 24 mmol/L (ref 22–32)
Calcium: 8.2 mg/dL — ABNORMAL LOW (ref 8.9–10.3)
Chloride: 105 mmol/L (ref 98–111)
Creatinine, Ser: 0.81 mg/dL (ref 0.61–1.24)
GFR calc Af Amer: >60 mL/min (ref >60)
GFR calc non Af Amer: >60 mL/min (ref >60)
Glucose, Bld: 138 mg/dL — ABNORMAL HIGH (ref 70–99)
Potassium: 4.4 mmol/L (ref 3.5–5.1)
Sodium: 136 mmol/L (ref 135–145)

## 2019-04-07 LAB — CBC
HCT: 36.2 % — ABNORMAL LOW (ref 39.0–52.0)
Hemoglobin: 12.2 g/dL — ABNORMAL LOW (ref 13.0–17.0)
MCH: 30 pg (ref 26.0–34.0)
MCHC: 33.7 g/dL (ref 30.0–36.0)
MCV: 89.2 fL (ref 80.0–100.0)
Platelets: 173 10*3/uL (ref 150–400)
RBC: 4.06 MIL/uL — ABNORMAL LOW (ref 4.22–5.81)
RDW: 12.8 % (ref 11.5–15.5)
WBC: 10.1 10*3/uL (ref 4.0–10.5)
nRBC: 0 % (ref 0.0–0.2)

## 2019-04-07 LAB — PHOSPHORUS: Phosphorus: 3.3 mg/dL (ref 2.5–4.6)

## 2019-04-07 LAB — MAGNESIUM: Magnesium: 1.8 mg/dL (ref 1.7–2.4)

## 2019-04-07 MED ORDER — ENSURE ENLIVE PO LIQD
237.0000 mL | Freq: Three times a day (TID) | ORAL | Status: DC
  Administered 2019-04-07 – 2019-04-10 (×5): 237 mL via ORAL

## 2019-04-07 NOTE — Progress Notes (Signed)
[facility] Day(s): 1.   Post op day(s): 1 Day Post-Op.   Interval History:  Patient seen and examined no acute events or new complaints overnight.  Patient reports he is doing well, no complaints of significant abdominal pina He does feel like his abdomen is distended this morning No nausea or emesis Likely reactive leukocytosis of 12K yesterday now resolved, no fevers Renal function in normal limits, U/O - 2.3L No electrolyte derangement JP drains with 60 ccs (left) and 60 ccs (right) since placement.  On CLD, no flatus   Vital signs in last 24 hours: [min-max] current  Temp:  [96.8 F (36 C)-98.8 F (37.1 C)] 98.8 F (37.1 C) (02/26 0414) Pulse Rate:  [82-98] 88 (02/26 0414) Resp:  [10-29] 20 (02/26 0414) BP: (90-149)/(57-80) 111/57 (02/26 0414) SpO2:  [94 %-99 %] 99 % (02/26 0414)     Height: 5\' 6"  (167.6 cm) Weight: 108.9 kg BMI (Calculated): 38.76   Intake/Output last 2 shifts:  02/25 0701 - 02/26 0700 In: 5335.5 [P.O.:240; I.V.:4702.2; IV Piggyback:393.4] Out: 2630 [Urine:2300; Drains:130; Blood:200]   Physical Exam:  Constitutional: alert, cooperative and no distress  Respiratory: breathing non-labored at rest  Cardiovascular: regular rate and sinus rhythm  Gastrointestinal: soft, non-tender, mild distension. No rebound/guarding. No gross tympany with percussion. JP drains in RLQ and LLQ with serosanguinous fluid.  Integumentary: Prevena to midline incision, good seal.   Labs:  CBC Latest Ref Rng & Units 04/07/2019 04/06/2019 04/04/2019  WBC 4.0 - 10.5 K/uL 10.1 12.3(H) 5.3  Hemoglobin 13.0 - 17.0 g/dL 12.2(L) 13.6 14.4  Hematocrit 39.0 - 52.0 % 36.2(L) 40.5 43.2  Platelets 150 - 400 K/uL 173 171 192   CMP Latest Ref Rng & Units 04/07/2019 04/06/2019 04/04/2019  Glucose 70 - 99 mg/dL 138(H) - 101(H)  BUN 8 - 23 mg/dL 21 - 23  Creatinine 0.61 - 1.24 mg/dL 0.81 1.10 0.85  Sodium 135 - 145 mmol/L 136 - 140   Potassium 3.5 - 5.1 mmol/L 4.4 - 4.2  Chloride 98 - 111 mmol/L 105 - 104  CO2 22 - 32 mmol/L 24 - 26  Calcium 8.9 - 10.3 mg/dL 8.2(L) - 9.6  Total Protein 6.0 - 8.5 g/dL - - -  Total Bilirubin 0.0 - 1.2 mg/dL - - -  Alkaline Phos 39 - 117 IU/L - - -  AST 0 - 40 IU/L - - -  ALT 0 - 44 IU/L - - -     Imaging studies: No new pertinent imaging studies   Assessment/Plan:  69 y.o. male overall doing well with possible early post-surgical ileus awaiting bowel function return 1 Day Post-Op s/p abdominal wall reconstruction, bilateral inguinal hernia repairs, appendectomy, and placement of Prevena wound vac for complex ventral hernia and bilateral inguinal hernias,    - Continue CLD; await more significant return of bowel function before advancing   - Continue IVF resuscitation  - Discontinue foley catheter  - pain control prn (minimize narcotics if feasible); antiemetics prn  - monitor abdominal examination; on-going bowel function  - Continue surgical drains; monitor and record output   - Encouraged mobilization   - Encouraged IS use  - medical management of comorbidities   All of the above findings and recommendations were discussed with the patient, and the medical team, and all of patient's questions were answered to his expressed satisfaction.  -- Edison Simon, PA-C [facility] 04/07/2019, 7:41 AM [phone] M-F: 7am - 4pm

## 2019-04-07 NOTE — Progress Notes (Signed)
Initial Nutrition Assessment  DOCUMENTATION CODES:   Obesity unspecified  INTERVENTION:   Ensure Enlive po TID, each supplement provides 350 kcal and 20 grams of protein  NUTRITION DIAGNOSIS:   Increased nutrient needs related to post-op healing as evidenced by increased estimated needs.  GOAL:   Patient will meet greater than or equal to 90% of their needs  MONITOR:   PO intake, Supplement acceptance, Labs, Weight trends, Skin, I & O's  REASON FOR ASSESSMENT:   Malnutrition Screening Tool    ASSESSMENT:   69 y/o male with h/o prostate cancer s/p prostatectomy who is now s/p abdominal wall reconstruction, bilateral inguinal hernia repairs, appendectomy and placement of Prevena wound vac 2/25   Pt reports good appetite and oral intake at baseline. Pt reports that his appetite has not returned back to normal yet since surgery but he is eating 100% of his meals in hospital. RD will add supplements to help pt meet his estimated needs and support wound healing; pt is willing to drink supplements. Per chart, pt appears weight stable pta. Pt did have some intentional weight loss earlier last year in preparation for his surgery.   Medications reviewed and include: lovenox, protonix, NaCl @75ml /hr  Labs reviewed: K 4.4 wnl, P 3.3 wnl, Mg 1.8 wnl  NUTRITION - FOCUSED PHYSICAL EXAM:    Most Recent Value  Orbital Region  No depletion  Upper Arm Region  No depletion  Thoracic and Lumbar Region  No depletion  Buccal Region  No depletion  Temple Region  No depletion  Clavicle Bone Region  No depletion  Clavicle and Acromion Bone Region  No depletion  Scapular Bone Region  No depletion  Dorsal Hand  No depletion  Patellar Region  No depletion  Anterior Thigh Region  No depletion  Posterior Calf Region  No depletion  Edema (RD Assessment)  None  Hair  Reviewed  Eyes  Reviewed  Mouth  Reviewed  Skin  Reviewed  Nails  Reviewed     Diet Order:   Diet Order            Diet  full liquid Room service appropriate? Yes; Fluid consistency: Thin  Diet effective 1400        Diet clear liquid Room service appropriate? Yes  Diet effective now             EDUCATION NEEDS:   Education needs have been addressed  Skin:  Skin Assessment: Reviewed RN Assessment(incision abdomen w/ VAC 30x3 cm)  Last BM:  2/24  Height:   Ht Readings from Last 1 Encounters:  04/06/19 5\' 6"  (1.676 m)    Weight:   Wt Readings from Last 1 Encounters:  04/06/19 108.9 kg    Ideal Body Weight:  64.5 kg  BMI:  Body mass index is 38.74 kg/m.  Estimated Nutritional Needs:   Kcal:  2200-2500kcal/day  Protein:  110-125g/day  Fluid:  1.9L/day  Koleen Distance MS, RD, LDN Contact information available in Amion

## 2019-04-07 NOTE — Anesthesia Postprocedure Evaluation (Signed)
Anesthesia Post Note  Patient: Mccartney Montour Whitmore  Procedure(s) Performed: HERNIA REPAIR VENTRAL ADULT (N/A ) REPAIR ABDOMINAL WALL (N/A ) APPENDECTOMY HERNIA REPAIR INGUINAL ADULT BILATERAL (Bilateral Inguinal)  Patient location during evaluation: PACU Anesthesia Type: General Level of consciousness: awake and alert Pain management: pain level controlled Vital Signs Assessment: post-procedure vital signs reviewed and stable Respiratory status: spontaneous breathing, nonlabored ventilation and respiratory function stable Cardiovascular status: blood pressure returned to baseline and stable Postop Assessment: no apparent nausea or vomiting Anesthetic complications: no     Last Vitals:  Vitals:   04/07/19 0341 04/07/19 0414  BP:  (!) 111/57  Pulse:  88  Resp:  20  Temp:  37.1 C  SpO2: 96% 99%    Last Pain:  Vitals:   04/07/19 0414  TempSrc: Oral  PainSc:                  Tera Mater

## 2019-04-07 NOTE — Plan of Care (Signed)

## 2019-04-08 ENCOUNTER — Inpatient Hospital Stay: Payer: 59

## 2019-04-08 ENCOUNTER — Encounter: Payer: Self-pay | Admitting: Surgery

## 2019-04-08 DIAGNOSIS — R0602 Shortness of breath: Secondary | ICD-10-CM

## 2019-04-08 DIAGNOSIS — R0600 Dyspnea, unspecified: Secondary | ICD-10-CM

## 2019-04-08 LAB — BASIC METABOLIC PANEL
Anion gap: 6 (ref 5–15)
BUN: 19 mg/dL (ref 8–23)
CO2: 24 mmol/L (ref 22–32)
Calcium: 8.4 mg/dL — ABNORMAL LOW (ref 8.9–10.3)
Chloride: 106 mmol/L (ref 98–111)
Creatinine, Ser: 0.87 mg/dL (ref 0.61–1.24)
GFR calc Af Amer: >60 mL/min (ref >60)
GFR calc non Af Amer: >60 mL/min (ref >60)
Glucose, Bld: 130 mg/dL — ABNORMAL HIGH (ref 70–99)
Potassium: 4.4 mmol/L (ref 3.5–5.1)
Sodium: 136 mmol/L (ref 135–145)

## 2019-04-08 LAB — BRAIN NATRIURETIC PEPTIDE: B Natriuretic Peptide: 44 pg/mL (ref 0.0–100.0)

## 2019-04-08 LAB — MAGNESIUM: Magnesium: 2.1 mg/dL (ref 1.7–2.4)

## 2019-04-08 LAB — TROPONIN I (HIGH SENSITIVITY)
Troponin I (High Sensitivity): 9 ng/L (ref <18)
Troponin I (High Sensitivity): 8 ng/L (ref <18)

## 2019-04-08 LAB — FIBRIN DERIVATIVES D-DIMER (ARMC ONLY): Fibrin derivatives D-dimer (ARMC): 2925.84 ng/mL (FEU) — ABNORMAL HIGH (ref 0.00–499.00)

## 2019-04-08 MED ORDER — FAMOTIDINE 20 MG PO TABS: 20.0000 mg | ORAL_TABLET | Freq: Every day | ORAL | Status: DC

## 2019-04-08 MED ORDER — SODIUM CHLORIDE 0.9% FLUSH
10.0000 mL | INTRAVENOUS | Status: DC | PRN
  Administered 2019-04-08: 10 mL

## 2019-04-08 MED ORDER — FAMOTIDINE IN NACL 20-0.9 MG/50ML-% IV SOLN: 20.0000 mg | Freq: Once | INTRAVENOUS | Status: DC

## 2019-04-08 MED ORDER — ALUM & MAG HYDROXIDE-SIMETH 200-200-20 MG/5ML PO SUSP
30.0000 mL | ORAL | Status: DC | PRN
  Administered 2019-04-08: 30 mL via ORAL
  Filled 2019-04-08: qty 30

## 2019-04-08 MED ORDER — FUROSEMIDE 10 MG/ML IJ SOLN
40.0000 mg | Freq: Once | INTRAMUSCULAR | Status: AC
  Administered 2019-04-08: 03:00:00 40 mg via INTRAVENOUS
  Filled 2019-04-08: qty 4

## 2019-04-08 MED ORDER — IOHEXOL 350 MG/ML SOLN
100.0000 mL | Freq: Once | INTRAVENOUS | Status: AC | PRN
  Administered 2019-04-08: 100 mL via INTRAVENOUS

## 2019-04-08 MED ORDER — SODIUM CHLORIDE 0.9 % IV SOLN: INTRAVENOUS | Status: DC

## 2019-04-08 MED ORDER — SODIUM CHLORIDE 0.9% FLUSH
10.0000 mL | Freq: Two times a day (BID) | INTRAVENOUS | Status: DC
  Administered 2019-04-08: 08:00:00 10 mL
  Administered 2019-04-08: 20 mL
  Administered 2019-04-09 – 2019-04-10 (×3): 10 mL

## 2019-04-08 NOTE — Progress Notes (Signed)
CC: s/p abd wall reconstruction  Subjective: Vents overnight noted.  Likely shortness of breath related to gastric distention from the ileus.  He actually had a good emesis and felt much better afterwards.  CT scan personally reviewed and there is really no evidence of concern for bowel injuries or intra-abdominal collections.  There is expected postoperative changes.  No evidence of PE.  He feels much better now.  No abdominal pain. White count is normal and BMP is normal.  hemoGlobin is stable. JP 98cc serosanguinous  Objective: Vital signs in last 24 hours: Temp:  [97.8 F (36.6 C)-99 F (37.2 C)] 97.8 F (36.6 C) (02/27 1144) Pulse Rate:  [54-98] 94 (02/27 1144) Resp:  [18-20] 18 (02/27 1144) BP: (134-154)/(69-86) 153/70 (02/27 1144) SpO2:  [94 %-99 %] 98 % (02/27 1144) Last BM Date: 04/05/19  Intake/Output from previous day: 02/26 0701 - 02/27 0700 In: 2609.7 [P.O.:1100; I.V.:1509.7] Out: 1569 [Urine:870; Emesis/NG output:601; Drains:98] Intake/Output this shift: Total I/O In: 290.3 [I.V.:290.3] Out: 0   Physical exam:  NAD Abd: decrease bs, distended, WOund vac in place, no peritonitis Serosanguineous JP.    Lab Results: CBC  Recent Labs    04/06/19 1556 04/07/19 0400  WBC 12.3* 10.1  HGB 13.6 12.2*  HCT 40.5 36.2*  PLT 171 173   BMET Recent Labs    04/07/19 0400 04/08/19 0322  NA 136 136  K 4.4 4.4  CL 105 106  CO2 24 24  GLUCOSE 138* 130*  BUN 21 19  CREATININE 0.81 0.87  CALCIUM 8.2* 8.4*   PT/INR No results for input(s): LABPROT, INR in the last 72 hours. ABG No results for input(s): PHART, HCO3 in the last 72 hours.  Invalid input(s): PCO2, PO2  Studies/Results: CT ANGIO CHEST PE W OR WO CONTRAST  Result Date: 04/08/2019 CLINICAL DATA:  Postoperative day 2 from bilateral inguinal hernia repair, appendectomy, lysis of adhesions and abdominal wall reconstruction including abdominal wall reconstruction with bilateral myocutaneous flaps.  EXAM: CT ANGIOGRAPHY CHEST CT ABDOMEN AND PELVIS WITH CONTRAST TECHNIQUE: Multidetector CT imaging of the chest was performed using the standard protocol during bolus administration of intravenous contrast. Multiplanar CT image reconstructions and MIPs were obtained to evaluate the vascular anatomy. Multidetector CT imaging of the abdomen and pelvis was performed using the standard protocol during bolus administration of intravenous contrast. CONTRAST:  130mL OMNIPAQUE IOHEXOL 350 MG/ML SOLN COMPARISON:  Abdomen/pelvis CT 01/09/2019 FINDINGS: CTA CHEST FINDINGS Cardiovascular: Heart size upper normal. Coronary artery calcification is evident. Atherosclerotic calcification is noted in the wall of the thoracic aorta. Bovine arch vessel anatomy noted, a normal variant. There is no filling defect in the opacified pulmonary arteries to suggest the presence of an acute pulmonary embolus. Mediastinum/Nodes: No mediastinal lymphadenopathy. Borderline enlarged 10 mm short axis right pericardial node visible on image 5/series 4. There is no hilar lymphadenopathy. Esophagus is filled with fluid, likely reflux given the appearance of the stomach. There is no axillary lymphadenopathy. Lungs/Pleura: Dependent atelectasis noted in both lower lobes. No suspicious focal airspace disease to suggest pneumonia. No pulmonary edema. No pleural effusion. Musculoskeletal: No worrisome lytic or sclerotic osseous abnormality. Review of the MIP images confirms the above findings. CT ABDOMEN and PELVIS FINDINGS Hepatobiliary: No suspicious focal abnormality within the liver parenchyma. There is no evidence for gallstones, gallbladder wall thickening, or pericholecystic fluid. No intrahepatic or extrahepatic biliary dilation. Pancreas: No focal mass lesion. No dilatation of the main duct. No intraparenchymal cyst. No peripancreatic edema. Spleen: No splenomegaly. No  focal mass lesion. Adrenals/Urinary Tract: No adrenal nodule or mass. Kidneys  unremarkable No evidence for hydroureter. Bladder is moderately distended. Distortion of normal bladder shape likely related to history of bladder extending into the left inguinal hernia as seen on 01/09/2019. Stomach/Bowel: Stomach is prominently distended with fluid and gas. Duodenum is fluid-filled and distended. Proximal jejunal loops are fluid-filled and distended up to 4.2 cm diameter gradual tapering of jejunal loops and decompressed mid and distal small bowel noted. The distal and terminal ileum is completely decompressed. Nonvisualization of the appendix is consistent with the reported history of appendectomy. No gross colonic mass. No colonic wall thickening. Vascular/Lymphatic: There is abdominal aortic atherosclerosis without aneurysm. There is no gastrohepatic or hepatoduodenal ligament lymphadenopathy. No retroperitoneal or mesenteric lymphadenopathy. No pelvic sidewall lymphadenopathy. Reproductive: Prostatectomy. Other: No intraperitoneal free fluid. Bilateral surgical drains evident with right-sided anterior abdominal drain tracking in or just distal to the rectus sheath. Left-sided drain appears to be in the preperitoneal space or potentially in the rectus sheath. Gas is identified in the anterior abdomen some of which may be intraperitoneal but most of it appears to be preperitoneal or within the rectus sheath. This would not be unexpected 2 days after surgery. Musculoskeletal: The large ventral hernia seen previously has been repaired in the interval. 4.2 x 4.1 x 3.2 cm collection of fluid and gas is identified in the midline subcutaneous fat at the repair site compatible with recent surgery and probably a small residual seroma or potentially hematoma. Gas and fluid in the right inguinal region is compatible with the recent herniorrhaphy. Similar fluid and gas noted in the left inguinal canal also compatible with recent herniorrhaphy. No worrisome lytic or sclerotic osseous abnormality. Review of  the MIP images confirms the above findings. IMPRESSION: 1. No CT evidence for acute pulmonary embolus. 2. Dependent atelectasis in the lower lobes without edema or pleural effusion. 3. Upper normal to borderline right pericardial lymph node with no other lymphadenopathy in the abdomen. Follow-up CT chest without contrast in 3 months suggested to ensure that this remains stable. 4. Fluid-filled distended stomach and proximal small bowel with gradual tapering of jejunal loops and decompressed mid and distal small bowel. Fluid reflux noted into the esophagus to a level above the carina. Given that the patient is only 2 days out from surgery and there is no abrupt small bowel transition zone, imaging features are most compatible with adynamic ileus. 5. 4.2 x 4.1 x 3.2 cm collection of fluid and gas in the midline subcutaneous fat at the repair site compatible with recent surgery and probably represents a small residual seroma or hematoma. 6. Tiny volume gas and fluid in the bilateral inguinal canals compatible with recent herniorrhaphy. 7. Small amount of gas in the anterior abdomen, some of which may be intraperitoneal but most of it appears to be preperitoneal or within the rectus sheath. This would not be unexpected 2 days after surgery. 8. Prostatectomy. 9. Aortic Atherosclerosis (ICD10-I70.0). Electronically Signed   By: Misty Stanley M.D.   On: 04/08/2019 06:08   CT ABDOMEN PELVIS W CONTRAST  Result Date: 04/08/2019 CLINICAL DATA:  Postoperative day 2 from bilateral inguinal hernia repair, appendectomy, lysis of adhesions and abdominal wall reconstruction including abdominal wall reconstruction with bilateral myocutaneous flaps. EXAM: CT ANGIOGRAPHY CHEST CT ABDOMEN AND PELVIS WITH CONTRAST TECHNIQUE: Multidetector CT imaging of the chest was performed using the standard protocol during bolus administration of intravenous contrast. Multiplanar CT image reconstructions and MIPs were obtained to evaluate the  vascular anatomy. Multidetector CT imaging of the abdomen and pelvis was performed using the standard protocol during bolus administration of intravenous contrast. CONTRAST:  165mL OMNIPAQUE IOHEXOL 350 MG/ML SOLN COMPARISON:  Abdomen/pelvis CT 01/09/2019 FINDINGS: CTA CHEST FINDINGS Cardiovascular: Heart size upper normal. Coronary artery calcification is evident. Atherosclerotic calcification is noted in the wall of the thoracic aorta. Bovine arch vessel anatomy noted, a normal variant. There is no filling defect in the opacified pulmonary arteries to suggest the presence of an acute pulmonary embolus. Mediastinum/Nodes: No mediastinal lymphadenopathy. Borderline enlarged 10 mm short axis right pericardial node visible on image 5/series 4. There is no hilar lymphadenopathy. Esophagus is filled with fluid, likely reflux given the appearance of the stomach. There is no axillary lymphadenopathy. Lungs/Pleura: Dependent atelectasis noted in both lower lobes. No suspicious focal airspace disease to suggest pneumonia. No pulmonary edema. No pleural effusion. Musculoskeletal: No worrisome lytic or sclerotic osseous abnormality. Review of the MIP images confirms the above findings. CT ABDOMEN and PELVIS FINDINGS Hepatobiliary: No suspicious focal abnormality within the liver parenchyma. There is no evidence for gallstones, gallbladder wall thickening, or pericholecystic fluid. No intrahepatic or extrahepatic biliary dilation. Pancreas: No focal mass lesion. No dilatation of the main duct. No intraparenchymal cyst. No peripancreatic edema. Spleen: No splenomegaly. No focal mass lesion. Adrenals/Urinary Tract: No adrenal nodule or mass. Kidneys unremarkable No evidence for hydroureter. Bladder is moderately distended. Distortion of normal bladder shape likely related to history of bladder extending into the left inguinal hernia as seen on 01/09/2019. Stomach/Bowel: Stomach is prominently distended with fluid and gas.  Duodenum is fluid-filled and distended. Proximal jejunal loops are fluid-filled and distended up to 4.2 cm diameter gradual tapering of jejunal loops and decompressed mid and distal small bowel noted. The distal and terminal ileum is completely decompressed. Nonvisualization of the appendix is consistent with the reported history of appendectomy. No gross colonic mass. No colonic wall thickening. Vascular/Lymphatic: There is abdominal aortic atherosclerosis without aneurysm. There is no gastrohepatic or hepatoduodenal ligament lymphadenopathy. No retroperitoneal or mesenteric lymphadenopathy. No pelvic sidewall lymphadenopathy. Reproductive: Prostatectomy. Other: No intraperitoneal free fluid. Bilateral surgical drains evident with right-sided anterior abdominal drain tracking in or just distal to the rectus sheath. Left-sided drain appears to be in the preperitoneal space or potentially in the rectus sheath. Gas is identified in the anterior abdomen some of which may be intraperitoneal but most of it appears to be preperitoneal or within the rectus sheath. This would not be unexpected 2 days after surgery. Musculoskeletal: The large ventral hernia seen previously has been repaired in the interval. 4.2 x 4.1 x 3.2 cm collection of fluid and gas is identified in the midline subcutaneous fat at the repair site compatible with recent surgery and probably a small residual seroma or potentially hematoma. Gas and fluid in the right inguinal region is compatible with the recent herniorrhaphy. Similar fluid and gas noted in the left inguinal canal also compatible with recent herniorrhaphy. No worrisome lytic or sclerotic osseous abnormality. Review of the MIP images confirms the above findings. IMPRESSION: 1. No CT evidence for acute pulmonary embolus. 2. Dependent atelectasis in the lower lobes without edema or pleural effusion. 3. Upper normal to borderline right pericardial lymph node with no other lymphadenopathy in the  abdomen. Follow-up CT chest without contrast in 3 months suggested to ensure that this remains stable. 4. Fluid-filled distended stomach and proximal small bowel with gradual tapering of jejunal loops and decompressed mid and distal small bowel. Fluid reflux noted into  the esophagus to a level above the carina. Given that the patient is only 2 days out from surgery and there is no abrupt small bowel transition zone, imaging features are most compatible with adynamic ileus. 5. 4.2 x 4.1 x 3.2 cm collection of fluid and gas in the midline subcutaneous fat at the repair site compatible with recent surgery and probably represents a small residual seroma or hematoma. 6. Tiny volume gas and fluid in the bilateral inguinal canals compatible with recent herniorrhaphy. 7. Small amount of gas in the anterior abdomen, some of which may be intraperitoneal but most of it appears to be preperitoneal or within the rectus sheath. This would not be unexpected 2 days after surgery. 8. Prostatectomy. 9. Aortic Atherosclerosis (ICD10-I70.0). Electronically Signed   By: Misty Stanley M.D.   On: 04/08/2019 06:08   DG Chest Port 1 View  Result Date: 04/08/2019 CLINICAL DATA:  Shortness of breath, history of prostate cancer EXAM: PORTABLE CHEST 1 VIEW COMPARISON:  09/30/2014 FINDINGS: 2 frontal views of the chest demonstrate an unremarkable cardiac silhouette. Lung volumes are diminished with scattered areas of atelectasis. No airspace disease, effusion, or pneumothorax. No acute bony abnormalities. Bilateral shoulder osteoarthritis. IMPRESSION: 1. Low lung volumes with scattered atelectasis. No acute airspace disease. Electronically Signed   By: Randa Ngo M.D.   On: 04/08/2019 03:09    Anti-infectives: Anti-infectives (From admission, onward)   Start     Dose/Rate Route Frequency Ordered Stop   04/06/19 1900  ceFAZolin (ANCEF) IVPB 2g/100 mL premix     2 g 200 mL/hr over 30 Minutes Intravenous Every 8 hours 04/06/19 1536  04/07/19 1951   04/06/19 0624  ceFAZolin (ANCEF) 2-4 GM/100ML-% IVPB    Note to Pharmacy: Sylvester Harder   : cabinet override      04/06/19 0624 04/06/19 0815   04/06/19 0615  ceFAZolin (ANCEF) IVPB 2g/100 mL premix     2 g 200 mL/hr over 30 Minutes Intravenous On call to O.R. 04/06/19 0607 04/06/19 1122      Assessment/Plan:  Status post abdominal wall reconstruction with ileus.  He is currently comfortable after the emesis.  If he has another exacerbation we will place an NG tube.  For now we will make sure that he is n.p.o. [place], MD, [facility]  04/08/2019

## 2019-04-08 NOTE — Progress Notes (Signed)
Patient arrived back form CT and was change dout of gown. Was about to leave patient room and he began projectile vomiting. Over 647ml of green emesis. Suction set up at bed side and rapid called. Dr. Dahlia Byes notified and Dr. Damita Dunnings. Patient made NPO. Patient states he "feels the best he has all night." Patient changed and is now resting.

## 2019-04-08 NOTE — Progress Notes (Addendum)
Was called into patient's room because he was having shortness of breath. He felt like he could not get enough air into his lungs. Only able to complete 500 on his incentive spirometer when normally does 1200. A little wheezing and abdominal distention. Patient placed on 2LOs for discomfort. Patient denies any pain. Dr. Dahlia Byes notified. Ordered portable chestxray. Will continue to monitor.  Noted that patients midline wound vac was turned off-most likely ran out of battery. Last time was on was around 0100. Wound vac was plugged back in, turned on and suction created again at the wound site.

## 2019-04-08 NOTE — Progress Notes (Signed)
No charge progress note.  Karl Gentry is an 69 y.o. male history of prostate cancer and hypertension admitted to the surgical service for elective repair of ventral abdominal hernia.  On 04/06/2019 patient underwent bilateral inguinal hernia repair, appendectomy, lysis of adhesions and abdominal wall reconstruction.  Patient was recovering uneventfully except for abdominal distention earlier in the day.  He states that around 1 AM on 04/07/2018 one the day of the consult, he started having problems taking a deep breath.  It worsened over the next couple hours, and Dr. Dahlia Byes was alerted and at that time requested consult.  Chest x-ray that was done showed no pneumothorax.  CTA was done and negative for PE. Elevated D-dimer but patient has an history of prostate cancer and recent surgery. The abdomen with distended stomach with fluid which was removed resulted in improvement in his symptoms.  Seen during morning rounds his shortness of breath has been improved.  He was saturating well on room air.  Likely secondary to fluid collection in stomach. -Continue to monitor.

## 2019-04-08 NOTE — Plan of Care (Signed)
Continue with plan of care.  

## 2019-04-08 NOTE — Progress Notes (Signed)
Prior to patient leaving for CT he stated he was feeling a little better. Has urinated quite a bit since he was given the lasix. Patient having some nausea and vomiting as well.   Patient a very difficult stick. 3 ivs were attempted by RNs on the unit and were unsuccessful. IV team consulted and they placed a midline in his [place]. In ability to get a proper IV for CT delayed getting CT of chest and abdomen. MD, Damita Dunnings notified of progress. Patient waked down to CT by staff member after midline access established.

## 2019-04-08 NOTE — Consult Note (Signed)
Medical Consultation   DONTAVIUS Gentry  [id number]  DOB: Apr 26, 1950  DOA: 04/06/2019  PCP: Glean Hess, MD   Requesting physician: Dr Dahlia Byes  Reason for consultation: Shortness of breath, sudden onset   History of Present Illness: Karl Gentry is an 69 y.o. male history of prostate cancer and hypertension admitted to the surgical service for elective repair of ventral abdominal hernia.  On 04/06/2019 patient underwent bilateral inguinal hernia repair, appendectomy, lysis of adhesions and abdominal wall reconstruction.  Patient was recovering uneventfully except for abdominal distention earlier in the day.  He states that around 1 AM on 04/07/2018 one the day of the consult, he started having problems taking a deep breath.  It worsened over the next couple hours, and Dr. Dahlia Byes was alerted and at that time requested consult.  Chest x-ray that was done showed no pneumothorax.  He had trouble getting past 500 mL on his incentive spirometer.  On my assessment, he was afebrile, BP 154/77 HR 92 O2 sat 97% on 2 L.  Patient was tachypneic with conversational dyspnea .  He denied associated chest pain.  He could not tell whether shortness of breath was worsened by sitting up or lying flat . He did complain of abdominal distention but stated it was uncomfortable but not painful.  He denied chest pain, nausea or vomiting or diaphoresis.  Denied pain or swelling in his legs.  Review of Systems:  ROS As per HPI otherwise 10 point review of systems negative.   Past Medical History: Past Medical History:  Diagnosis Date  . Abdominal pain   . 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 Cancer [facility]) 04-13-11   recent dx. prostate cancer, surgery planned  . ED (erectile dysfunction)   . History of elevated PSA 2021   PSA starting to rise again. unable to get all of cancer during surgery  . Hypertension   . Inguinal hernia bilateral, non-recurrent 04-13-11   states bilateral  at present / umbilical hernia also  . Prostate cancer ([place]) 04/23/11   bx=Adenocarcinoma,Gleason=4+3=7,PSA=5.5,volume  =36cc  . Radiation 03/02/2012-04/20/2012   prostate bed 6600 cGy  . Stress incontinence, male     Past Surgical History: Past Surgical History:  Procedure Laterality Date  . ABDOMINAL WALL DEFECT REPAIR N/A 04/06/2019   Procedure: REPAIR ABDOMINAL WALL;  Surgeon: Jules Husbands, MD;  Location: [facility] ORS;  Service: General;  Laterality: N/A;  . APPENDECTOMY  04/06/2019   Procedure: APPENDECTOMY;  Surgeon: Jules Husbands, MD;  Location: [facility] ORS;  Service: General;;  . BIOPSY PROSTATE  2013   bx. 2'13 [place]  . HERNIA REPAIR Right 1977    -open rt. right inguinal hernia repair/mesh  . INGUINAL HERNIA REPAIR Bilateral 04/06/2019   Procedure: HERNIA REPAIR INGUINAL ADULT BILATERAL;  Surgeon: Jules Husbands, MD;  Location: [facility] ORS;  Service: General;  Laterality: Bilateral;  . ROBOT ASSISTED LAPAROSCOPIC RADICAL PROSTATECTOMY  04/23/2011   Procedure: ROBOTIC ASSISTED LAPAROSCOPIC RADICAL PROSTATECTOMY LEVEL 2;  Surgeon: Dutch Gray, MD;  Location: [facility] ORS;  Service: Urology;  Laterality: N/A;    . VENTRAL HERNIA REPAIR N/A 04/06/2019   Procedure: HERNIA REPAIR VENTRAL ADULT;  Surgeon: Jules Husbands, MD;  Location: [facility] ORS;  Service: General;  Laterality: N/A;     Allergies:   Allergies  Allergen Reactions  . Molds & Smuts     Mildew and cats  also cause irritated eyes, itching, running nose     Social History:  reports that he quit smoking about 51 years ago. His smoking use included pipe. He quit after 10.00 years of use. He has quit using smokeless tobacco.  His smokeless tobacco use included chew. He reports that he does not drink alcohol or use drugs.   Family History: Family History  Problem Relation Age of Onset  . Cancer Mother        breast age 65  . Cancer Father        colon age 58    Unacceptable: Noncontributory, unremarkable, or  negative. Acceptable: Family history reviewed and not pertinent (If you reviewed it)   Physical Exam: Vitals:   04/07/19 0902 04/07/19 1329 04/07/19 2026 04/08/19 0234  BP: 118/60 115/68 134/69 (!) 154/77  Pulse: 79 82 98 92  Resp: 17  20   Temp: 98.6 F (37 C) 99.2 F (37.3 C) 99 F (37.2 C) 98.9 F (37.2 C)  TempSrc: Oral Oral Oral Oral  SpO2: 98% 97% 94% 97%  Weight:      Height:        Constitutional: Appearance,  Alert and awake, oriented x3, mildly tachypneic eyes: PERLA, EOMI, irises appear normal, anicteric sclera,  ENMT: external ears and nose appear normal, normal hearing.           Lips appears normal, oropharynx mucosa, tongue, posterior pharynx appear normal  Neck: neck appears normal, no masses, normal ROM, no thyromegaly, no JVD  CVS: S1-S2 clear, no murmur rubs or gallops, no LE edema, normal pedal pulses  Respiratory:  clear to auscultation bilaterally, no wheezing, rales or rhonchi.  Tachypnea. No accessory muscle use.  Abdomen: Distended, nontender, bowel sounds.,  Wound VAC on midline surgical wound .  Dry dressing on wounds of inguinal hernia repair musculoskeletal: : no cyanosis, clubbing or edema noted bilaterally Neuro: Cranial nerves II-XII intact, strength, sensation, reflexes Psych: judgement and insight appear normal, stable mood and affect, mental status Skin: no rashes or lesions or ulcers, no induration or nodules    Data reviewed:  I have personally reviewed following labs and imaging studies Labs:  CBC: Recent Labs  Lab 04/04/19 0918 04/06/19 1556 04/07/19 0400  WBC 5.3 12.3* 10.1  HGB 14.4 13.6 12.2*  HCT 43.2 40.5 36.2*  MCV 88.3 90.0 89.2  PLT 192 171 [id number]    Basic Metabolic Panel: Recent Labs  Lab 04/04/19 0918 04/06/19 1556 04/07/19 0400 04/08/19 0322  NA 140  --  136  --   K 4.2  --  4.4  --   CL 104  --  105  --   CO2 26  --  24  --   GLUCOSE 101*  --  138*  --   BUN 23  --  21  --   CREATININE 0.85 1.10 0.81  --     CALCIUM 9.6  --  8.2*  --   MG  --   --  1.8 2.1  PHOS  --   --  3.3  --    GFR Estimated Creatinine Clearance: 102.4 mL/min (by C-G formula based on SCr of 0.81 mg/dL). Liver Function Tests: No results for input(s): AST, ALT, ALKPHOS, BILITOT, PROT, ALBUMIN in the last 168 hours. No results for input(s): LIPASE, AMYLASE in the last 168 hours. No results for input(s): AMMONIA in the last 168 hours. Coagulation profile No results for input(s): INR, PROTIME in the last 168 hours.  Cardiac Enzymes:  No results for input(s): CKTOTAL, CKMB, CKMBINDEX, TROPONINI in the last 168 hours. BNP: Invalid input(s): POCBNP CBG: No results for input(s): GLUCAP in the last 168 hours. D-Dimer No results for input(s): DDIMER in the last 72 hours. Hgb A1c No results for input(s): HGBA1C in the last 72 hours. Lipid Profile No results for input(s): CHOL, HDL, LDLCALC, TRIG, CHOLHDL, LDLDIRECT in the last 72 hours. Thyroid function studies No results for input(s): TSH, T4TOTAL, T3FREE, THYROIDAB in the last 72 hours.  Invalid input(s): FREET3 Anemia work up No results for input(s): VITAMINB12, FOLATE, FERRITIN, TIBC, IRON, RETICCTPCT in the last 72 hours. Urinalysis No results found for: COLORURINE, APPEARANCEUR, LABSPEC, [place], [place], [place], [place], [place], PROTEINUR, UROBILINOGEN, NITRITE, LEUKOCYTESUR   Microbiology Recent Results (from the past 240 hour(s))  SARS CORONAVIRUS 2 (TAT 6-24 HRS) Nasopharyngeal Nasopharyngeal Swab     Status: None   Collection Time: 04/04/19 10:11 AM   Specimen: Nasopharyngeal Swab  Result Value Ref Range Status   SARS Coronavirus 2 NEGATIVE NEGATIVE Final    Comment: (NOTE) SARS-CoV-2 target nucleic acids are NOT DETECTED. The SARS-CoV-2 RNA is generally detectable in upper and lower respiratory specimens during the acute phase of infection. Negative results do not preclude SARS-CoV-2 infection, do not rule out co-infections with other  pathogens, and should not be used as the sole basis for treatment or other patient management decisions. Negative results must be combined with clinical observations, patient history, and epidemiological information. The expected result is Negative. Fact Sheet for Patients: SugarRoll.be Fact Sheet for Healthcare Providers: https://www.woods-mathews.com/ This test is not yet approved or cleared by the [place] FDA and  has been authorized for detection and/or diagnosis of SARS-CoV-2 by FDA under an Emergency Use Authorization (EUA). This EUA will remain  in effect (meaning this test can be used) for the duration of the COVID-19 declaration under Section 56 4(b)(1) of the Act, 21 U.S.C. section 360bbb-3(b)(1), unless the authorization is terminated or revoked sooner. Performed at [facility], [place] [address] [place], [place] [address]        Inpatient Medications:   Scheduled Meds: . acetaminophen  1,000 mg Oral Q6H  . enoxaparin (LOVENOX) injection  40 mg Subcutaneous Q24H  . feeding supplement (ENSURE ENLIVE)  237 mL Oral TID BM  . gabapentin  300 mg Oral TID  . influenza vaccine adjuvanted  0.5 mL Intramuscular Tomorrow-1000  . ketorolac  15 mg Intravenous Q6H  . pantoprazole (PROTONIX) IV  40 mg Intravenous QHS  . pneumococcal 23 valent vaccine  0.5 mL Intramuscular Tomorrow-1000   Continuous Infusions:   Radiological Exams on Admission: DG Chest Port 1 View  Result Date: 04/08/2019 CLINICAL DATA:  Shortness of breath, history of prostate cancer EXAM: PORTABLE CHEST 1 VIEW COMPARISON:  09/30/2014 FINDINGS: 2 frontal views of the chest demonstrate an unremarkable cardiac silhouette. Lung volumes are diminished with scattered areas of atelectasis. No airspace disease, effusion, or pneumothorax. No acute bony abnormalities. Bilateral shoulder osteoarthritis. IMPRESSION: 1. Low lung volumes with scattered atelectasis. No acute  airspace disease. Electronically Signed   By: Randa Ngo M.D.   On: 04/08/2019 03:09    Impression/Recommendations Active Problems:   Acute dyspnea in the setting of recent repair of ventral hernia -Patient with acute dyspnea in the setting of recent ventral hernia repair -Chest x-ray with no evidence of pneumothorax or fluid/interstitial edema -Follow troponin, BNP, EKG D-dimer(expected to be elevated given recent surgery), EKG -CT angiogram of the chest to evaluate for acute PE  in the setting of since surgery plus history of prostate cancer -CT abdomen and pelvis to evaluate for acute intra-abdominal/pelvic process related to surgery -Oxygen to keep sats over 94% -Patient was previously given an empiric dose of Lasix for positive fluid balance   Status post ventral hernia repair -Continued management per surgery  History of prostate cancer -No acute issues    Essential hypertension -Continue current as needed hydralazine     Thank you for this consultation.  Our [facility] hospitalist team will follow the patient with you.   Time Spent: 34 min  Athena Masse M.D. [facility] 04/08/2019, 3:52 AM

## 2019-04-08 NOTE — Progress Notes (Signed)
Patient complaint of heartburn. Sharion Settler notified and patient given Maalox.

## 2019-04-09 DIAGNOSIS — Z8546 Personal history of malignant neoplasm of prostate: Secondary | ICD-10-CM

## 2019-04-09 DIAGNOSIS — Z9889 Other specified postprocedural states: Secondary | ICD-10-CM

## 2019-04-09 DIAGNOSIS — R0602 Shortness of breath: Secondary | ICD-10-CM

## 2019-04-09 DIAGNOSIS — I10 Essential (primary) hypertension: Secondary | ICD-10-CM

## 2019-04-09 DIAGNOSIS — Z8719 Personal history of other diseases of the digestive system: Secondary | ICD-10-CM

## 2019-04-09 LAB — BASIC METABOLIC PANEL
Anion gap: 7 (ref 5–15)
BUN: 26 mg/dL — ABNORMAL HIGH (ref 8–23)
CO2: 24 mmol/L (ref 22–32)
Calcium: 8.6 mg/dL — ABNORMAL LOW (ref 8.9–10.3)
Chloride: 108 mmol/L (ref 98–111)
Creatinine, Ser: 1.01 mg/dL (ref 0.61–1.24)
GFR calc Af Amer: >60 mL/min (ref >60)
GFR calc non Af Amer: >60 mL/min (ref >60)
Glucose, Bld: 112 mg/dL — ABNORMAL HIGH (ref 70–99)
Potassium: 3.8 mmol/L (ref 3.5–5.1)
Sodium: 139 mmol/L (ref 135–145)

## 2019-04-09 LAB — CBC
HCT: 34.4 % — ABNORMAL LOW (ref 39.0–52.0)
Hemoglobin: 11.3 g/dL — ABNORMAL LOW (ref 13.0–17.0)
MCH: 30.1 pg (ref 26.0–34.0)
MCHC: 32.8 g/dL (ref 30.0–36.0)
MCV: 91.5 fL (ref 80.0–100.0)
Platelets: 151 10*3/uL (ref 150–400)
RBC: 3.76 MIL/uL — ABNORMAL LOW (ref 4.22–5.81)
RDW: 13.4 % (ref 11.5–15.5)
WBC: 6.1 10*3/uL (ref 4.0–10.5)
nRBC: 0 % (ref 0.0–0.2)

## 2019-04-09 LAB — PHOSPHORUS: Phosphorus: 2.2 mg/dL — ABNORMAL LOW (ref 2.5–4.6)

## 2019-04-09 LAB — MAGNESIUM: Magnesium: 2.3 mg/dL (ref 1.7–2.4)

## 2019-04-09 MED ORDER — SODIUM PHOSPHATES 45 MMOLE/15ML IV SOLN
30.0000 mmol | Freq: Once | INTRAVENOUS | Status: AC
  Administered 2019-04-09: 09:00:00 30 mmol via INTRAVENOUS
  Filled 2019-04-09: qty 10

## 2019-04-09 MED ORDER — IRBESARTAN 150 MG PO TABS
150.0000 mg | ORAL_TABLET | Freq: Every day | ORAL | Status: DC
  Administered 2019-04-09 – 2019-04-10 (×2): 150 mg via ORAL
  Filled 2019-04-09 (×2): qty 1

## 2019-04-09 NOTE — Plan of Care (Signed)
Continuing with plan of care. 

## 2019-04-09 NOTE — Progress Notes (Signed)
PROGRESS NOTE    Karl Gentry  [id number] DOB: 1950/11/19 DOA: 04/06/2019 PCP: Glean Hess, MD   Brief Narrative:  Karl Gentry an 69 y.o.malehistory of prostate cancer and hypertension admitted to the surgical service for elective repair of ventral abdominal hernia. On 04/06/2019 patient underwent bilateral inguinal hernia repair, appendectomy, lysis of adhesions and abdominal wall reconstruction. Patient was recovering uneventfully except for abdominal distention earlier in the day. He states that around 1 AM on 04/07/2018 one the day of the consult, he started having problems taking a deep breath. It worsened over the next couple hours, and Dr. Collier Bullock alerted and at that time requested consult. Chest x-ray that was done showed no pneumothorax.  CTA was done and negative for PE. Elevated D-dimer but patient has an history of prostate cancer and recent surgery. The abdomen with distended stomach with fluid which was removed resulted in improvement in his symptoms.  Subjective: Patient was feeling better when seen during morning rounds.  Denies any more shortness of breath.  Able to tolerate some food.  Passing flatus.  Assessment & Plan:   Active Problems:   Essential hypertension   S/P repair of ventral hernia   Acute dyspnea   History of prostate cancer  Acute dyspnea in the setting of recent repair of ventral hernia.  Most likely secondary to stomach distention with fluid as it resolves with suctioning. Symptoms has been resolved.  Saturating well on room air. CTA negative for PE. Elevated D-dimer but patient has an history of prostate cancer and a recent surgery. -Monitor.  Status post ventral hernia repair -Continued management per surgery  History of prostate cancer -No acute issues    Essential hypertension.  Pressure mildly elevated. -Restart home dose of olmesartan-replaced with equal dose of irbesartan according to formulary. -Continue  as needed hydralazine   Objective: Vitals:   04/08/19 1935 04/09/19 0412 04/09/19 0414 04/09/19 1202  BP: (!) 143/74 134/70  (!) 156/79  Pulse: 93 96  (!) 106  Resp: 20 19    Temp: 98 F (36.7 C)  98.3 F (36.8 C) 98.3 F (36.8 C)  TempSrc: Oral  Oral   SpO2: 94% 95%  99%  Weight:      Height:        Intake/Output Summary (Last 24 hours) at 04/09/2019 1356 Last data filed at 04/09/2019 1300 Gross per 24 hour  Intake 2515.94 ml  Output 345 ml  Net 2170.94 ml   Filed Weights   04/06/19 0617  Weight: 108.9 kg    Examination:  General exam: Appears calm and comfortable  Respiratory system: Clear to auscultation. Respiratory effort normal. Cardiovascular system: S1 & S2 heard, RRR. No JVD, murmurs, rubs, gallops or clicks. Gastrointestinal system: Soft, nontender, nondistended, bowel sounds positive. Central nervous system: Alert and oriented. No focal neurological deficits.Symmetric 5 x 5 power. Extremities: No edema, no cyanosis, pulses intact and symmetrical. Skin: No rashes, lesions or ulcers Psychiatry: Judgement and insight appear normal. Mood & affect appropriate.    DVT prophylaxis: Lovenox Code Status: Full Family Communication: No family at bedside Disposition Plan: Pending improvement.  Surgery is primary.  Consultants:  Surgery  Procedures:  Hernia repair  Antimicrobials:   Data Reviewed: I have personally reviewed following labs and imaging studies  CBC: Recent Labs  Lab 04/04/19 0918 04/06/19 1556 04/07/19 0400 04/09/19 0455  WBC 5.3 12.3* 10.1 6.1  HGB 14.4 13.6 12.2* 11.3*  HCT 43.2 40.5 36.2* 34.4*  MCV 88.3 90.0 89.2 91.5  PLT 192 171 173 [id number]   Basic Metabolic Panel: Recent Labs  Lab 04/04/19 0918 04/06/19 1556 04/07/19 0400 04/08/19 0322 04/09/19 0455  NA 140  --  136 136 139  K 4.2  --  4.4 4.4 3.8  CL 104  --  105 106 108  CO2 26  --  24 24 24   GLUCOSE 101*  --  138* 130* 112*  BUN 23  --  21 19 26*  CREATININE 0.85  1.10 0.81 0.87 1.01  CALCIUM 9.6  --  8.2* 8.4* 8.6*  MG  --   --  1.8 2.1 2.3  PHOS  --   --  3.3  --  2.2*   GFR: Estimated Creatinine Clearance: 82.1 mL/min (by C-G formula based on SCr of 1.01 mg/dL). Liver Function Tests: No results for input(s): AST, ALT, ALKPHOS, BILITOT, PROT, ALBUMIN in the last 168 hours. No results for input(s): LIPASE, AMYLASE in the last 168 hours. No results for input(s): AMMONIA in the last 168 hours. Coagulation Profile: No results for input(s): INR, PROTIME in the last 168 hours. Cardiac Enzymes: No results for input(s): CKTOTAL, CKMB, CKMBINDEX, TROPONINI in the last 168 hours. BNP (last 3 results) No results for input(s): PROBNP in the last 8760 hours. HbA1C: No results for input(s): HGBA1C in the last 72 hours. CBG: No results for input(s): GLUCAP in the last 168 hours. Lipid Profile: No results for input(s): CHOL, HDL, LDLCALC, TRIG, CHOLHDL, LDLDIRECT in the last 72 hours. Thyroid Function Tests: No results for input(s): TSH, T4TOTAL, FREET4, T3FREE, THYROIDAB in the last 72 hours. Anemia Panel: No results for input(s): VITAMINB12, FOLATE, FERRITIN, TIBC, IRON, RETICCTPCT in the last 72 hours. Sepsis Labs: No results for input(s): PROCALCITON, LATICACIDVEN in the last 168 hours.  Recent Results (from the past 240 hour(s))  SARS CORONAVIRUS 2 (TAT 6-24 HRS) Nasopharyngeal Nasopharyngeal Swab     Status: None   Collection Time: 04/04/19 10:11 AM   Specimen: Nasopharyngeal Swab  Result Value Ref Range Status   SARS Coronavirus 2 NEGATIVE NEGATIVE Final    Comment: (NOTE) SARS-CoV-2 target nucleic acids are NOT DETECTED. The SARS-CoV-2 RNA is generally detectable in upper and lower respiratory specimens during the acute phase of infection. Negative results do not preclude SARS-CoV-2 infection, do not rule out co-infections with other pathogens, and should not be used as the sole basis for treatment or other patient management  decisions. Negative results must be combined with clinical observations, patient history, and epidemiological information. The expected result is Negative. Fact Sheet for Patients: SugarRoll.be Fact Sheet for Healthcare Providers: https://www.woods-mathews.com/ This test is not yet approved or cleared by the [place] FDA and  has been authorized for detection and/or diagnosis of SARS-CoV-2 by FDA under an Emergency Use Authorization (EUA). This EUA will remain  in effect (meaning this test can be used) for the duration of the COVID-19 declaration under Section 56 4(b)(1) of the Act, 21 U.S.C. section 360bbb-3(b)(1), unless the authorization is terminated or revoked sooner. Performed at [facility], [place] [address] [place], [place] [address]      Radiology Studies: CT ANGIO CHEST PE W OR WO CONTRAST  Result Date: 04/08/2019 CLINICAL DATA:  Postoperative day 2 from bilateral inguinal hernia repair, appendectomy, lysis of adhesions and abdominal wall reconstruction including abdominal wall reconstruction with bilateral myocutaneous flaps. EXAM: CT ANGIOGRAPHY CHEST CT ABDOMEN AND PELVIS WITH CONTRAST TECHNIQUE: Multidetector CT imaging of the chest was performed using the standard protocol during bolus administration  of intravenous contrast. Multiplanar CT image reconstructions and MIPs were obtained to evaluate the vascular anatomy. Multidetector CT imaging of the abdomen and pelvis was performed using the standard protocol during bolus administration of intravenous contrast. CONTRAST:  125mL OMNIPAQUE IOHEXOL 350 MG/ML SOLN COMPARISON:  Abdomen/pelvis CT 01/09/2019 FINDINGS: CTA CHEST FINDINGS Cardiovascular: Heart size upper normal. Coronary artery calcification is evident. Atherosclerotic calcification is noted in the wall of the thoracic aorta. Bovine arch vessel anatomy noted, a normal variant. There is no filling defect in the opacified  pulmonary arteries to suggest the presence of an acute pulmonary embolus. Mediastinum/Nodes: No mediastinal lymphadenopathy. Borderline enlarged 10 mm short axis right pericardial node visible on image 5/series 4. There is no hilar lymphadenopathy. Esophagus is filled with fluid, likely reflux given the appearance of the stomach. There is no axillary lymphadenopathy. Lungs/Pleura: Dependent atelectasis noted in both lower lobes. No suspicious focal airspace disease to suggest pneumonia. No pulmonary edema. No pleural effusion. Musculoskeletal: No worrisome lytic or sclerotic osseous abnormality. Review of the MIP images confirms the above findings. CT ABDOMEN and PELVIS FINDINGS Hepatobiliary: No suspicious focal abnormality within the liver parenchyma. There is no evidence for gallstones, gallbladder wall thickening, or pericholecystic fluid. No intrahepatic or extrahepatic biliary dilation. Pancreas: No focal mass lesion. No dilatation of the main duct. No intraparenchymal cyst. No peripancreatic edema. Spleen: No splenomegaly. No focal mass lesion. Adrenals/Urinary Tract: No adrenal nodule or mass. Kidneys unremarkable No evidence for hydroureter. Bladder is moderately distended. Distortion of normal bladder shape likely related to history of bladder extending into the left inguinal hernia as seen on 01/09/2019. Stomach/Bowel: Stomach is prominently distended with fluid and gas. Duodenum is fluid-filled and distended. Proximal jejunal loops are fluid-filled and distended up to 4.2 cm diameter gradual tapering of jejunal loops and decompressed mid and distal small bowel noted. The distal and terminal ileum is completely decompressed. Nonvisualization of the appendix is consistent with the reported history of appendectomy. No gross colonic mass. No colonic wall thickening. Vascular/Lymphatic: There is abdominal aortic atherosclerosis without aneurysm. There is no gastrohepatic or hepatoduodenal ligament  lymphadenopathy. No retroperitoneal or mesenteric lymphadenopathy. No pelvic sidewall lymphadenopathy. Reproductive: Prostatectomy. Other: No intraperitoneal free fluid. Bilateral surgical drains evident with right-sided anterior abdominal drain tracking in or just distal to the rectus sheath. Left-sided drain appears to be in the preperitoneal space or potentially in the rectus sheath. Gas is identified in the anterior abdomen some of which may be intraperitoneal but most of it appears to be preperitoneal or within the rectus sheath. This would not be unexpected 2 days after surgery. Musculoskeletal: The large ventral hernia seen previously has been repaired in the interval. 4.2 x 4.1 x 3.2 cm collection of fluid and gas is identified in the midline subcutaneous fat at the repair site compatible with recent surgery and probably a small residual seroma or potentially hematoma. Gas and fluid in the right inguinal region is compatible with the recent herniorrhaphy. Similar fluid and gas noted in the left inguinal canal also compatible with recent herniorrhaphy. No worrisome lytic or sclerotic osseous abnormality. Review of the MIP images confirms the above findings. IMPRESSION: 1. No CT evidence for acute pulmonary embolus. 2. Dependent atelectasis in the lower lobes without edema or pleural effusion. 3. Upper normal to borderline right pericardial lymph node with no other lymphadenopathy in the abdomen. Follow-up CT chest without contrast in 3 months suggested to ensure that this remains stable. 4. Fluid-filled distended stomach and proximal small bowel with gradual tapering  of jejunal loops and decompressed mid and distal small bowel. Fluid reflux noted into the esophagus to a level above the carina. Given that the patient is only 2 days out from surgery and there is no abrupt small bowel transition zone, imaging features are most compatible with adynamic ileus. 5. 4.2 x 4.1 x 3.2 cm collection of fluid and gas in  the midline subcutaneous fat at the repair site compatible with recent surgery and probably represents a small residual seroma or hematoma. 6. Tiny volume gas and fluid in the bilateral inguinal canals compatible with recent herniorrhaphy. 7. Small amount of gas in the anterior abdomen, some of which may be intraperitoneal but most of it appears to be preperitoneal or within the rectus sheath. This would not be unexpected 2 days after surgery. 8. Prostatectomy. 9. Aortic Atherosclerosis (ICD10-I70.0). Electronically Signed   By: Misty Stanley M.D.   On: 04/08/2019 06:08   CT ABDOMEN PELVIS W CONTRAST  Result Date: 04/08/2019 CLINICAL DATA:  Postoperative day 2 from bilateral inguinal hernia repair, appendectomy, lysis of adhesions and abdominal wall reconstruction including abdominal wall reconstruction with bilateral myocutaneous flaps. EXAM: CT ANGIOGRAPHY CHEST CT ABDOMEN AND PELVIS WITH CONTRAST TECHNIQUE: Multidetector CT imaging of the chest was performed using the standard protocol during bolus administration of intravenous contrast. Multiplanar CT image reconstructions and MIPs were obtained to evaluate the vascular anatomy. Multidetector CT imaging of the abdomen and pelvis was performed using the standard protocol during bolus administration of intravenous contrast. CONTRAST:  149mL OMNIPAQUE IOHEXOL 350 MG/ML SOLN COMPARISON:  Abdomen/pelvis CT 01/09/2019 FINDINGS: CTA CHEST FINDINGS Cardiovascular: Heart size upper normal. Coronary artery calcification is evident. Atherosclerotic calcification is noted in the wall of the thoracic aorta. Bovine arch vessel anatomy noted, a normal variant. There is no filling defect in the opacified pulmonary arteries to suggest the presence of an acute pulmonary embolus. Mediastinum/Nodes: No mediastinal lymphadenopathy. Borderline enlarged 10 mm short axis right pericardial node visible on image 5/series 4. There is no hilar lymphadenopathy. Esophagus is filled with  fluid, likely reflux given the appearance of the stomach. There is no axillary lymphadenopathy. Lungs/Pleura: Dependent atelectasis noted in both lower lobes. No suspicious focal airspace disease to suggest pneumonia. No pulmonary edema. No pleural effusion. Musculoskeletal: No worrisome lytic or sclerotic osseous abnormality. Review of the MIP images confirms the above findings. CT ABDOMEN and PELVIS FINDINGS Hepatobiliary: No suspicious focal abnormality within the liver parenchyma. There is no evidence for gallstones, gallbladder wall thickening, or pericholecystic fluid. No intrahepatic or extrahepatic biliary dilation. Pancreas: No focal mass lesion. No dilatation of the main duct. No intraparenchymal cyst. No peripancreatic edema. Spleen: No splenomegaly. No focal mass lesion. Adrenals/Urinary Tract: No adrenal nodule or mass. Kidneys unremarkable No evidence for hydroureter. Bladder is moderately distended. Distortion of normal bladder shape likely related to history of bladder extending into the left inguinal hernia as seen on 01/09/2019. Stomach/Bowel: Stomach is prominently distended with fluid and gas. Duodenum is fluid-filled and distended. Proximal jejunal loops are fluid-filled and distended up to 4.2 cm diameter gradual tapering of jejunal loops and decompressed mid and distal small bowel noted. The distal and terminal ileum is completely decompressed. Nonvisualization of the appendix is consistent with the reported history of appendectomy. No gross colonic mass. No colonic wall thickening. Vascular/Lymphatic: There is abdominal aortic atherosclerosis without aneurysm. There is no gastrohepatic or hepatoduodenal ligament lymphadenopathy. No retroperitoneal or mesenteric lymphadenopathy. No pelvic sidewall lymphadenopathy. Reproductive: Prostatectomy. Other: No intraperitoneal free fluid. Bilateral surgical drains evident  with right-sided anterior abdominal drain tracking in or just distal to the  rectus sheath. Left-sided drain appears to be in the preperitoneal space or potentially in the rectus sheath. Gas is identified in the anterior abdomen some of which may be intraperitoneal but most of it appears to be preperitoneal or within the rectus sheath. This would not be unexpected 2 days after surgery. Musculoskeletal: The large ventral hernia seen previously has been repaired in the interval. 4.2 x 4.1 x 3.2 cm collection of fluid and gas is identified in the midline subcutaneous fat at the repair site compatible with recent surgery and probably a small residual seroma or potentially hematoma. Gas and fluid in the right inguinal region is compatible with the recent herniorrhaphy. Similar fluid and gas noted in the left inguinal canal also compatible with recent herniorrhaphy. No worrisome lytic or sclerotic osseous abnormality. Review of the MIP images confirms the above findings. IMPRESSION: 1. No CT evidence for acute pulmonary embolus. 2. Dependent atelectasis in the lower lobes without edema or pleural effusion. 3. Upper normal to borderline right pericardial lymph node with no other lymphadenopathy in the abdomen. Follow-up CT chest without contrast in 3 months suggested to ensure that this remains stable. 4. Fluid-filled distended stomach and proximal small bowel with gradual tapering of jejunal loops and decompressed mid and distal small bowel. Fluid reflux noted into the esophagus to a level above the carina. Given that the patient is only 2 days out from surgery and there is no abrupt small bowel transition zone, imaging features are most compatible with adynamic ileus. 5. 4.2 x 4.1 x 3.2 cm collection of fluid and gas in the midline subcutaneous fat at the repair site compatible with recent surgery and probably represents a small residual seroma or hematoma. 6. Tiny volume gas and fluid in the bilateral inguinal canals compatible with recent herniorrhaphy. 7. Small amount of gas in the anterior  abdomen, some of which may be intraperitoneal but most of it appears to be preperitoneal or within the rectus sheath. This would not be unexpected 2 days after surgery. 8. Prostatectomy. 9. Aortic Atherosclerosis (ICD10-I70.0). Electronically Signed   By: Misty Stanley M.D.   On: 04/08/2019 06:08   DG Chest Port 1 View  Result Date: 04/08/2019 CLINICAL DATA:  Shortness of breath, history of prostate cancer EXAM: PORTABLE CHEST 1 VIEW COMPARISON:  09/30/2014 FINDINGS: 2 frontal views of the chest demonstrate an unremarkable cardiac silhouette. Lung volumes are diminished with scattered areas of atelectasis. No airspace disease, effusion, or pneumothorax. No acute bony abnormalities. Bilateral shoulder osteoarthritis. IMPRESSION: 1. Low lung volumes with scattered atelectasis. No acute airspace disease. Electronically Signed   By: Randa Ngo M.D.   On: 04/08/2019 03:09    Scheduled Meds: . acetaminophen  1,000 mg Oral Q6H  . enoxaparin (LOVENOX) injection  40 mg Subcutaneous Q24H  . feeding supplement (ENSURE ENLIVE)  237 mL Oral TID BM  . gabapentin  300 mg Oral TID  . influenza vaccine adjuvanted  0.5 mL Intramuscular Tomorrow-1000  . ketorolac  15 mg Intravenous Q6H  . pantoprazole (PROTONIX) IV  40 mg Intravenous QHS  . pneumococcal 23 valent vaccine  0.5 mL Intramuscular Tomorrow-1000  . sodium chloride flush  10-40 mL Intracatheter Q12H   Continuous Infusions: . sodium chloride 75 mL/hr at 04/08/19 2156  . sodium phosphate  Dextrose 5% IVPB 30 mmol (04/09/19 0914)     LOS: 3 days   Time spent: 30 minutes.  Lorella Nimrod, MD [facility]  If 7PM-7AM, please contact night-coverage [email or web address]  04/09/2019, 1:56 PM   This record has been created using Systems analyst. Errors have been sought and corrected,but may not always be located. Such creation errors do not reflect on the standard of care.

## 2019-04-09 NOTE — Progress Notes (Signed)
POD # 3 Feeling much better, No SOB + flatus and bm Took CLD this am Ambulating No abd pain JP 30cc Creat ok, nml wbc  PE NAD ABd: soft, nt, wound vac in place. JP serous fluid. No infection  A/P doing well Resolving ileus advance diet Mobilize DC in 24-48 hrs w one drain and Prevena vac

## 2019-04-10 LAB — BASIC METABOLIC PANEL
Anion gap: 8 (ref 5–15)
BUN: 20 mg/dL (ref 8–23)
CO2: 25 mmol/L (ref 22–32)
Calcium: 8.1 mg/dL — ABNORMAL LOW (ref 8.9–10.3)
Chloride: 107 mmol/L (ref 98–111)
Creatinine, Ser: 0.89 mg/dL (ref 0.61–1.24)
GFR calc Af Amer: >60 mL/min (ref >60)
GFR calc non Af Amer: >60 mL/min (ref >60)
Glucose, Bld: 105 mg/dL — ABNORMAL HIGH (ref 70–99)
Potassium: 3.5 mmol/L (ref 3.5–5.1)
Sodium: 140 mmol/L (ref 135–145)

## 2019-04-10 LAB — CBC
HCT: 30.9 % — ABNORMAL LOW (ref 39.0–52.0)
Hemoglobin: 10.6 g/dL — ABNORMAL LOW (ref 13.0–17.0)
MCH: 31.3 pg (ref 26.0–34.0)
MCHC: 34.3 g/dL (ref 30.0–36.0)
MCV: 91.2 fL (ref 80.0–100.0)
Platelets: 152 10*3/uL (ref 150–400)
RBC: 3.39 MIL/uL — ABNORMAL LOW (ref 4.22–5.81)
RDW: 13.2 % (ref 11.5–15.5)
WBC: 5.8 10*3/uL (ref 4.0–10.5)
nRBC: 0 % (ref 0.0–0.2)

## 2019-04-10 LAB — SURGICAL PATHOLOGY

## 2019-04-10 LAB — PHOSPHORUS: Phosphorus: 2.9 mg/dL (ref 2.5–4.6)

## 2019-04-10 LAB — MAGNESIUM: Magnesium: 2.2 mg/dL (ref 1.7–2.4)

## 2019-04-10 MED ORDER — OXYCODONE HCL 5 MG PO TABS: 5.0000 mg | ORAL_TABLET | ORAL | 0 refills | Status: DC | PRN

## 2019-04-10 MED ORDER — GABAPENTIN 600 MG PO TABS: 600.0000 mg | ORAL_TABLET | Freq: Three times a day (TID) | ORAL | 0 refills | Status: DC

## 2019-04-10 NOTE — Progress Notes (Signed)
PROGRESS NOTE    Karl Gentry  [id number] DOB: 1950-04-23 DOA: 04/06/2019 PCP: Glean Hess, MD   Brief Narrative:  Karl Gentry an 69 y.o.malehistory of prostate cancer and hypertension admitted to the surgical service for elective repair of ventral abdominal hernia. On 04/06/2019 patient underwent bilateral inguinal hernia repair, appendectomy, lysis of adhesions and abdominal wall reconstruction. Patient was recovering uneventfully except for abdominal distention earlier in the day. He states that around 1 AM on 04/07/2018 one the day of the consult, he started having problems taking a deep breath. It worsened over the next couple hours, and Dr. Collier Bullock alerted and at that time requested consult. Chest x-ray that was done showed no pneumothorax.  CTA was done and negative for PE. Elevated D-dimer but patient has an history of prostate cancer and recent surgery. The abdomen with distended stomach with fluid which was removed resulted in improvement in his symptoms.  Subjective: Patient was feeling better when seen during morning rounds.  Denies any more shortness of breath.  He was getting ready to go home as discharged from surgery standpoint.  Assessment & Plan:   Active Problems:   Essential hypertension   S/P repair of ventral hernia   Shortness of breath   History of prostate cancer  Acute dyspnea in the setting of recent repair of ventral hernia.  Most likely secondary to stomach distention with fluid as it resolves with suctioning. Symptoms has been resolved.  Saturating well on room air. CTA negative for PE. Elevated D-dimer but patient has an history of prostate cancer and a recent surgery.  Status post ventral hernia repair -Continued management per surgery  History of prostate cancer -No acute issues    Essential hypertension.  Pressure mildly elevated. -Continue home dose of olmesartan-replaced with equal dose of irbesartan according to  formulary in hospital. -Continue as needed hydralazine   Objective: Vitals:   04/09/19 0414 04/09/19 1202 04/09/19 2011 04/10/19 0504  BP:  (!) 156/79 (!) 146/67 (!) 146/73  Pulse:  (!) 106 89 80  Resp:   18 20  Temp: 98.3 F (36.8 C) 98.3 F (36.8 C) 98.3 F (36.8 C) 98.8 F (37.1 C)  TempSrc: Oral  Oral Oral  SpO2:  99% 96% 94%  Weight:      Height:        Intake/Output Summary (Last 24 hours) at 04/10/2019 1204 Last data filed at 04/10/2019 0924 Gross per 24 hour  Intake 1687.5 ml  Output 5 ml  Net 1682.5 ml   Filed Weights   04/06/19 0617  Weight: 108.9 kg    Examination:  General exam: Appears calm and comfortable  Respiratory system: Clear to auscultation. Respiratory effort normal. Cardiovascular system: S1 & S2 heard, RRR. No JVD, murmurs, rubs, gallops or clicks. Gastrointestinal system: Soft, nontender, nondistended, bowel sounds positive. Central nervous system: Alert and oriented. No focal neurological deficits.Symmetric 5 x 5 power. Extremities: No edema, no cyanosis, pulses intact and symmetrical. Skin: No rashes, lesions or ulcers Psychiatry: Judgement and insight appear normal. Mood & affect appropriate.    DVT prophylaxis: Lovenox Code Status: Full Family Communication: Wife was updated at bedside Disposition Plan: Going home today.  Surgery is primary.  Consultants:  Surgery  Procedures:  Hernia repair  Antimicrobials:   Data Reviewed: I have personally reviewed following labs and imaging studies  CBC: Recent Labs  Lab 04/04/19 0918 04/06/19 1556 04/07/19 0400 04/09/19 0455 04/10/19 0506  WBC 5.3 12.3* 10.1 6.1 5.8  HGB 14.4  13.6 12.2* 11.3* 10.6*  HCT 43.2 40.5 36.2* 34.4* 30.9*  MCV 88.3 90.0 89.2 91.5 91.2  PLT 192 171 173 151 0000000   Basic Metabolic Panel: Recent Labs  Lab 04/04/19 0918 04/04/19 0918 04/06/19 1556 04/07/19 0400 04/08/19 0322 04/09/19 0455 04/10/19 0506  NA 140  --   --  136 136 139 140  K 4.2  --    --  4.4 4.4 3.8 3.5  CL 104  --   --  105 106 108 107  CO2 26  --   --  24 24 24 25   GLUCOSE 101*  --   --  138* 130* 112* 105*  BUN 23  --   --  21 19 26* 20  CREATININE 0.85   < > 1.10 0.81 0.87 1.01 0.89  CALCIUM 9.6  --   --  8.2* 8.4* 8.6* 8.1*  MG  --   --   --  1.8 2.1 2.3 2.2  PHOS  --   --   --  3.3  --  2.2* 2.9   < > = values in this interval not displayed.   GFR: Estimated Creatinine Clearance: 93.2 mL/min (by C-G formula based on SCr of 0.89 mg/dL). Liver Function Tests: No results for input(s): AST, ALT, ALKPHOS, BILITOT, PROT, ALBUMIN in the last 168 hours. No results for input(s): LIPASE, AMYLASE in the last 168 hours. No results for input(s): AMMONIA in the last 168 hours. Coagulation Profile: No results for input(s): INR, PROTIME in the last 168 hours. Cardiac Enzymes: No results for input(s): CKTOTAL, CKMB, CKMBINDEX, TROPONINI in the last 168 hours. BNP (last 3 results) No results for input(s): PROBNP in the last 8760 hours. HbA1C: No results for input(s): HGBA1C in the last 72 hours. CBG: No results for input(s): GLUCAP in the last 168 hours. Lipid Profile: No results for input(s): CHOL, HDL, LDLCALC, TRIG, CHOLHDL, LDLDIRECT in the last 72 hours. Thyroid Function Tests: No results for input(s): TSH, T4TOTAL, FREET4, T3FREE, THYROIDAB in the last 72 hours. Anemia Panel: No results for input(s): VITAMINB12, FOLATE, FERRITIN, TIBC, IRON, RETICCTPCT in the last 72 hours. Sepsis Labs: No results for input(s): PROCALCITON, LATICACIDVEN in the last 168 hours.  Recent Results (from the past 240 hour(s))  SARS CORONAVIRUS 2 (TAT 6-24 HRS) Nasopharyngeal Nasopharyngeal Swab     Status: None   Collection Time: 04/04/19 10:11 AM   Specimen: Nasopharyngeal Swab  Result Value Ref Range Status   SARS Coronavirus 2 NEGATIVE NEGATIVE Final    Comment: (NOTE) SARS-CoV-2 target nucleic acids are NOT DETECTED. The SARS-CoV-2 RNA is generally detectable in upper and  lower respiratory specimens during the acute phase of infection. Negative results do not preclude SARS-CoV-2 infection, do not rule out co-infections with other pathogens, and should not be used as the sole basis for treatment or other patient management decisions. Negative results must be combined with clinical observations, patient history, and epidemiological information. The expected result is Negative. Fact Sheet for Patients: SugarRoll.be Fact Sheet for Healthcare Providers: https://www.woods-mathews.com/ This test is not yet approved or cleared by the [place] FDA and  has been authorized for detection and/or diagnosis of SARS-CoV-2 by FDA under an Emergency Use Authorization (EUA). This EUA will remain  in effect (meaning this test can be used) for the duration of the COVID-19 declaration under Section 56 4(b)(1) of the Act, 21 U.S.C. section 360bbb-3(b)(1), unless the authorization is terminated or revoked sooner. Performed at [facility], [place]  [address], [place], [place] [address]      Radiology Studies: No results found.  Scheduled Meds: . acetaminophen  1,000 mg Oral Q6H  . enoxaparin (LOVENOX) injection  40 mg Subcutaneous Q24H  . feeding supplement (ENSURE ENLIVE)  237 mL Oral TID BM  . gabapentin  300 mg Oral TID  . influenza vaccine adjuvanted  0.5 mL Intramuscular Tomorrow-1000  . irbesartan  150 mg Oral Daily  . ketorolac  15 mg Intravenous Q6H  . pantoprazole (PROTONIX) IV  40 mg Intravenous QHS  . pneumococcal 23 valent vaccine  0.5 mL Intramuscular Tomorrow-1000  . sodium chloride flush  10-40 mL Intracatheter Q12H   Continuous Infusions:    LOS: 4 days   Time spent: 30 minutes.  Lorella Nimrod, MD [facility]  If 7PM-7AM, please contact night-coverage [email or web address]  04/10/2019, 12:04 PM   This record has been created using Systems analyst. Errors have been sought and  corrected,but may not always be located. Such creation errors do not reflect on the standard of care.

## 2019-04-10 NOTE — Progress Notes (Signed)
Karl Gentry  A and O x 4. VSS. Pt tolerating diet well. No complaints of pain or nausea. IV removed intact, prescriptions given. Pt voiced understanding of discharge instructions with no further questions. Pt discharged via wheelchair with NT.    Allergies as of 04/10/2019      Reactions   Molds & Smuts    Mildew and cats also cause irritated eyes, itching, running nose      Medication List    TAKE these medications   gabapentin 600 MG tablet Commonly known as: NEURONTIN Take 1 tablet (600 mg total) by mouth 3 (three) times daily. Notes to patient: 04/10/19   olmesartan 20 MG tablet Commonly known as: Benicar Take 1 tablet (20 mg total) by mouth daily.   oxyCODONE 5 MG immediate release tablet Commonly known as: Oxy IR/ROXICODONE Take 1-2 tablets (5-10 mg total) by mouth every 4 (four) hours as needed for moderate pain.       Vitals:   04/10/19 0504 04/10/19 1419  BP: (!) 146/73 (!) 146/77  Pulse: 80 82  Resp: 20 18  Temp: 98.8 F (37.1 C) 97.9 F (36.6 C)  SpO2: 94% 96%    Karl Gentry

## 2019-04-10 NOTE — Discharge Summary (Signed)
[facility] SURGICAL DISCHARGE SUMMARY  Patient ID: Karl Gentry MRN: [id number] DOB/AGE: 1950/08/12 69 y.o.  Admit date: 04/06/2019 Discharge date: 04/10/2019  Discharge Diagnoses Patient Active Problem List   Diagnosis Date Noted  . S/P repair of ventral hernia 04/06/2019  . Ventral hernia without obstruction or gangrene 06/21/2018    Consultants None   Procedures 04/06/2019:  1.Lysis of adhesions 2. Excisional debridement abdominal wall measuring 30x3 cm= 90cm2 including fascia 3. Appendectomy 4. Abdominal wall reconstruction ( ventral hernia repair ) with bilateral Myocutaneous flaps, TAR release Bilaterally using 30.5x30.5 cms Phasix Mesh in a Diamond configuration 5. Bilateral Inguinal Hernia repair w 15 x 15 cm Polypropylene soft Mesh 6. Placement of Prevena wound VAC 90cm2   HPI: Mr. Karl Gentry is a 69 year old male well-known to me with a ventral hernia with loss of domain as well as bilateral inguinal hernias who presents to [facility] for repair on 02/25 with Dr [facility] Hospital Course: Informed consent was obtained and documented, and patient underwent uneventful abdominal wall reconstruction and bilateral inguinal hernia reapirs (Dr Dahlia Byes, 04/06/2019).  Post-operatively, patient's had a post-surgical ileus which resolved spontaneously. Advancement of patient's diet and ambulation were well-tolerated. The remainder of patient's hospital course was essentially unremarkable, and discharge planning was initiated accordingly with patient safely able to be discharged home with appropriate discharge instructions, pain control, and outpatient follow-up after all of his questions were answered to his expressed satisfaction.   Discharge Condition: Good   Physical Examination:  Constitutional: alert, cooperative and no distress  Respiratory: breathing non-labored at rest  Cardiovascular: regular rate and sinus rhythm  Gastrointestinal: soft, non-tender, mild  distension. No rebound/guarding. No gross tympany with percussion. JP drains in RLQ and LLQ with serosanguinous fluid.  Integumentary: Prevena to midline incision, good seal.    Allergies as of 04/10/2019      Reactions   Molds & Smuts    Mildew and cats also cause irritated eyes, itching, running nose      Medication List    TAKE these medications   gabapentin 600 MG tablet Commonly known as: NEURONTIN Take 1 tablet (600 mg total) by mouth 3 (three) times daily.   olmesartan 20 MG tablet Commonly known as: Benicar Take 1 tablet (20 mg total) by mouth daily.   oxyCODONE 5 MG immediate release tablet Commonly known as: Oxy IR/ROXICODONE Take 1-2 tablets (5-10 mg total) by mouth every 4 (four) hours as needed for moderate pain.        Follow-up Information    Tylene Fantasia, PA-C. Schedule an appointment as soon as possible for a visit on 04/14/2019.   Specialty: Physician Assistant Why: s/p abdominal wall reconstruction, has prevena and drain Contact information: [address] [place] [address] [phone]            Time spent on discharge management including discussion of hospital course, clinical condition, outpatient instructions, prescriptions, and follow up with the patient and members of the medical team: >30 minutes  -- Edison Simon , PA-C [facility]  04/10/2019, 9:41 AM [phone] M-F: 7am - 4pm

## 2019-04-14 ENCOUNTER — Other Ambulatory Visit: Payer: Self-pay

## 2019-04-14 ENCOUNTER — Encounter: Payer: Self-pay | Admitting: Surgery

## 2019-04-14 ENCOUNTER — Ambulatory Visit (INDEPENDENT_AMBULATORY_CARE_PROVIDER_SITE_OTHER): Payer: Self-pay | Admitting: Surgery

## 2019-04-14 VITALS — BP 161/85 | HR 89 | Temp 96.6°F | Ht 66.0 in | Wt 240.4 lb

## 2019-04-14 DIAGNOSIS — Z09 Encounter for follow-up examination after completed treatment for conditions other than malignant neoplasm: Secondary | ICD-10-CM

## 2019-04-14 NOTE — Patient Instructions (Addendum)
Dr.Pabon removed both of patient's drains at today's visit. Dr.Pabon advised patient that he can stop taking Gabapentin prescription. Patient will come in Wednesday for a follow up appointment to have staples removed. Dr.Pabon advised patient to take a shower today to get the wound wet. Patient advised to walk and exercise to increase the activity. Please give our office a call for any questions or concerns.   GENERAL POST-OPERATIVE PATIENT INSTRUCTIONS   FOLLOW-UP:  Please make an appointment with your physician in.  Call your physician immediately if you have any fevers greater than 102.5, drainage from you wound that is not clear or looks infected, persistent bleeding, increasing abdominal pain, problems urinating, or persistent nausea/vomiting.    WOUND CARE INSTRUCTIONS:  Keep a dry clean dressing on the wound if there is drainage. The initial bandage may be removed after 24 hours.  Once the wound has quit draining you may leave it open to air.  If clothing rubs against the wound or causes irritation and the wound is not draining you may cover it with a dry dressing during the daytime.  Try to keep the wound dry and avoid ointments on the wound unless directed to do so.  If the wound becomes bright red and painful or starts to drain infected material that is not clear, please contact your physician immediately.  If the wound is mildly pink and has a thick firm ridge underneath it, this is normal, and is referred to as a healing ridge.  This will resolve over the next 4-6 weeks.  DIET:  You may eat any foods that you can tolerate.  It is a good idea to eat a high fiber diet and take in plenty of fluids to prevent constipation.  If you do become constipated you may want to take a mild laxative or take ducolax tablets on a daily basis until your bowel habits are regular.  Constipation can be very uncomfortable, along with straining, after recent surgery.  ACTIVITY:  You are encouraged to cough and deep  breath or use your incentive spirometer if you were given one, every 15-30 minutes when awake.  This will help prevent respiratory complications and low grade fevers post-operatively if you had a general anesthetic.  You may want to hug a pillow when coughing and sneezing to add additional support to the surgical area, if you had abdominal or chest surgery, which will decrease pain during these times.  You are encouraged to walk and engage in light activity for the next two weeks.  You should not lift more than 20 pounds during this time frame as it could put you at increased risk for complications.  Twenty pounds is roughly equivalent to a plastic bag of groceries.    MEDICATIONS:  Try to take narcotic medications and anti-inflammatory medications, such as tylenol, ibuprofen, naprosyn, etc., with food.  This will minimize stomach upset from the medication.  Should you develop nausea and vomiting from the pain medication, or develop a rash, please discontinue the medication and contact your physician.  You should not drive, make important decisions, or operate machinery when taking narcotic pain medication.  QUESTIONS:  Please feel free to call your physician or the hospital operator if you have any questions, and they will be glad to assist you.

## 2019-04-14 NOTE — Progress Notes (Signed)
S/p abd wall reconstruction DOing well taking PO Some decrease appetite + flatus and bm No pain, no fevers, walking well  PE NAD Abd: soft, staples in place no infection. prevena removed and jp removed No recurrence  A/P doing well Dc staples next week

## 2019-04-19 ENCOUNTER — Ambulatory Visit (INDEPENDENT_AMBULATORY_CARE_PROVIDER_SITE_OTHER): Payer: Self-pay | Admitting: Surgery

## 2019-04-19 ENCOUNTER — Other Ambulatory Visit: Payer: Self-pay

## 2019-04-19 ENCOUNTER — Encounter: Payer: Self-pay | Admitting: Surgery

## 2019-04-19 VITALS — BP 112/79 | HR 88 | Temp 97.9°F | Resp 12 | Ht 66.0 in | Wt 226.8 lb

## 2019-04-19 DIAGNOSIS — Z09 Encounter for follow-up examination after completed treatment for conditions other than malignant neoplasm: Secondary | ICD-10-CM

## 2019-04-19 NOTE — Patient Instructions (Addendum)
Dr Dahlia Byes removed half of your staples today and replaced it with steri-strips. They will begin to come off within 7-10 days. Don't shower today, but can continue your normal shower tomorrow 04/20/19.   We will see you back in 1 week for a nurse visit to removed the remaining staples and we will replace it with steri-strips like todays visit.   You will follow up with Dr Dahlia Byes in 3 weeks. Please see you appointment below.   GENERAL POST-OPERATIVE PATIENT INSTRUCTIONS   WOUND CARE INSTRUCTIONS:  Keep a dry clean dressing on the wound if there is drainage. The initial bandage may be removed after 24 hours.  Once the wound has quit draining you may leave it open to air.  If clothing rubs against the wound or causes irritation and the wound is not draining you may cover it with a dry dressing during the daytime.  Try to keep the wound dry and avoid ointments on the wound unless directed to do so.  If the wound becomes bright red and painful or starts to drain infected material that is not clear, please contact your physician immediately.  If the wound is mildly pink and has a thick firm ridge underneath it, this is normal, and is referred to as a healing ridge.  This will resolve over the next 4-6 weeks.  BATHING: You may shower if you have been informed of this by your surgeon. However, Please do not submerge in a tub, hot tub, or pool until incisions are completely sealed or have been told by your surgeon that you may do so.  DIET:  You may eat any foods that you can tolerate.  It is a good idea to eat a high fiber diet and take in plenty of fluids to prevent constipation.  If you do become constipated you may want to take a mild laxative or take ducolax tablets on a daily basis until your bowel habits are regular.  Constipation can be very uncomfortable, along with straining, after recent surgery.  ACTIVITY:  You are encouraged to cough and deep breath or use your incentive spirometer if you were given  one, every 15-30 minutes when awake.  This will help prevent respiratory complications and low grade fevers post-operatively if you had a general anesthetic.  You may want to hug a pillow when coughing and sneezing to add additional support to the surgical area, if you had abdominal or chest surgery, which will decrease pain during these times.  You are encouraged to walk and engage in light activity for the next two weeks.  You should not lift more than 20 pounds, until 04/17/19 as it could put you at increased risk for complications.  Twenty pounds is roughly equivalent to a plastic bag of groceries. At that time- Listen to your body when lifting, if you have pain when lifting, stop and then try again in a few days. Soreness after doing exercises or activities of daily living is normal as you get back in to your normal routine.  MEDICATIONS:  Try to take narcotic medications and anti-inflammatory medications, such as tylenol, ibuprofen, naprosyn, etc., with food.  This will minimize stomach upset from the medication.  Should you develop nausea and vomiting from the pain medication, or develop a rash, please discontinue the medication and contact your physician.  You should not drive, make important decisions, or operate machinery when taking narcotic pain medication.  SUNBLOCK Use sun block to incision area over the next year if this  area will be exposed to sun. This helps decrease scarring and will allow you avoid a permanent darkened area over your incision.  QUESTIONS:  Please feel free to call our office if you have any questions, and we will be glad to assist you.

## 2019-04-21 NOTE — Progress Notes (Signed)
S/p abd wall reconstruction.  Doing well Did have nause a few days ago but now better Did have some pain but much better now Ambulated  PE: NAD Abd: soft, NT, no recurrence or infection half staples removed.   A/p Doing well  No complications RTC RN visit to remove the other half of staples

## 2019-04-26 ENCOUNTER — Other Ambulatory Visit: Payer: Self-pay

## 2019-04-26 ENCOUNTER — Ambulatory Visit (INDEPENDENT_AMBULATORY_CARE_PROVIDER_SITE_OTHER): Payer: Self-pay

## 2019-04-26 DIAGNOSIS — Z09 Encounter for follow-up examination after completed treatment for conditions other than malignant neoplasm: Secondary | ICD-10-CM

## 2019-04-26 NOTE — Patient Instructions (Signed)
Patient was here today to have remainder of his staples removed. Patient wound did not show any signs of infections. Patient denies having any fever, nausea, vomit or chills.

## 2019-05-03 ENCOUNTER — Ambulatory Visit: Payer: 59 | Admitting: Internal Medicine

## 2019-05-03 ENCOUNTER — Encounter: Payer: Self-pay | Admitting: Internal Medicine

## 2019-05-03 ENCOUNTER — Other Ambulatory Visit: Payer: Self-pay

## 2019-05-03 VITALS — BP 110/68 | HR 86 | Temp 96.0°F | Ht 66.0 in | Wt 232.0 lb

## 2019-05-03 DIAGNOSIS — Z9889 Other specified postprocedural states: Secondary | ICD-10-CM | POA: Diagnosis not present

## 2019-05-03 DIAGNOSIS — Z8719 Personal history of other diseases of the digestive system: Secondary | ICD-10-CM

## 2019-05-03 DIAGNOSIS — I10 Essential (primary) hypertension: Secondary | ICD-10-CM | POA: Diagnosis not present

## 2019-05-03 DIAGNOSIS — C61 Malignant neoplasm of prostate: Secondary | ICD-10-CM | POA: Diagnosis not present

## 2019-05-03 NOTE — Progress Notes (Signed)
Date:  05/03/2019   Name:  Karl Gentry   DOB:  02-15-1950   MRN:  [id number]   Chief Complaint: Hypertension (6 MONTH FOLLOW UP.)  Hypertension This is a chronic problem. The problem is controlled. Pertinent negatives include no chest pain, headaches, palpitations or shortness of breath. Past treatments include angiotensin blockers. The current treatment provides significant improvement. There is no history of kidney disease or CAD/MI.  Prostate cancer - he had treatment with surgery in 2013.  PSA has been increasing and at the most recent value it had jumped up.  He is now on Leuprolide injections.  He is taking Flax seed, vitamin D and calcium to help counteract the anti-androgen state. Hernia repair - doing well s/p surgery 3 weeks ago.  Still not back to work but having no pain.  Staples out completely on 04/26/19.  Lab Results  Component Value Date   CREATININE 0.89 04/10/2019   BUN 20 04/10/2019   NA 140 04/10/2019   K 3.5 04/10/2019   CL 107 04/10/2019   CO2 25 04/10/2019   No results found for: CHOL, HDL, LDLCALC, LDLDIRECT, TRIG, CHOLHDL Lab Results  Component Value Date   TSH 2.390 06/21/2018   No results found for: HGBA1C Lab Results  Component Value Date   WBC 5.8 04/10/2019   HGB 10.6 (L) 04/10/2019   HCT 30.9 (L) 04/10/2019   MCV 91.2 04/10/2019   PLT 152 04/10/2019   Lab Results  Component Value Date   ALT 34 06/21/2018   AST 23 06/21/2018   ALKPHOS 57 06/21/2018   BILITOT 0.3 06/21/2018     Review of Systems  Constitutional: Negative for appetite change, fatigue and unexpected weight change.  Eyes: Negative for visual disturbance.  Respiratory: Negative for cough, shortness of breath and wheezing.   Cardiovascular: Negative for chest pain, palpitations and leg swelling.  Gastrointestinal: Negative for abdominal pain and blood in stool.  Endocrine: Negative for polydipsia and polyuria.  Genitourinary: Negative for dysuria and hematuria.    Skin: Negative for color change and rash.  Neurological: Negative for tremors, numbness and headaches.  Psychiatric/Behavioral: Negative for dysphoric mood.    Patient Active Problem List   Diagnosis Date Noted  . Shortness of breath 04/08/2019  . History of prostate cancer 04/08/2019  . S/P repair of ventral hernia 04/06/2019  . Essential hypertension 06/21/2018  . Ventral hernia without obstruction or gangrene 06/21/2018  . Prostate cancer ([place]) 04/23/2011    Allergies  Allergen Reactions  . Molds & Smuts     Mildew and cats also cause irritated eyes, itching, running nose    Past Surgical History:  Procedure Laterality Date  . ABDOMINAL WALL DEFECT REPAIR N/A 04/06/2019   Procedure: REPAIR ABDOMINAL WALL;  Surgeon: Jules Husbands, MD;  Location: [facility];  Service: General;  Laterality: N/A;  . APPENDECTOMY  04/06/2019   Procedure: APPENDECTOMY;  Surgeon: Jules Husbands, MD;  Location: [facility] OR[facility];  Service: General;;  . BIOPSY PROSTATE  2013   bx. 2'13 [place] [facility]  . HERNIA REPAIR Right 1977    -open rt. right inguinal hernia repair/mesh  . INGUINAL HERNIA REPAIR Bilateral 04/06/2019   Procedure: HERNIA REPAIR INGUINAL ADULT BILATERAL;  Surgeon: Jules Husbands, MD;  Location: [facility];  Service: General;  Laterality: Bilateral;  . ROBOT ASSISTED LAPAROSCOPIC RADICAL PROSTATECTOMY  04/23/2011   Procedure: ROBOTIC ASSISTED LAPAROSCOPIC RADICAL PROSTATECTOMY LEVEL 2;  Surgeon: Dutch Gray, MD;  Location: [facility];  Service: Urology;  Laterality: N/A;    . VENTRAL HERNIA REPAIR N/A 04/06/2019   Procedure: HERNIA REPAIR VENTRAL ADULT;  Surgeon: Jules Husbands, MD;  Location: [facility];  Service: General;  Laterality: N/A;    Social History   Tobacco Use  . Smoking status: Former Smoker    Years: 10.00    Types: Pipe    Quit date: 1970    Years since quitting: 51.2  . Smokeless tobacco: Former Systems developer    Types: Chew  Substance Use Topics  . Alcohol use: No  . Drug  use: No     Medication list has been reviewed and updated.  Current Meds  Medication Sig  . calcium citrate (CALCITRATE - DOSED IN MG ELEMENTAL CALCIUM) 950 (200 Ca) MG tablet Take 200 mg of elemental calcium by mouth daily.  . Flaxseed, Linseed, (FLAXSEED OIL) 1000 MG CAPS Take by mouth.  . Leuprolide Acetate (ELIGARD [place]) Inject into the skin. PRN  . olmesartan (BENICAR) 20 MG tablet Take 1 tablet (20 mg total) by mouth daily.    PHQ 2/9 Scores 05/03/2019 09/29/2018 06/21/2018  PHQ - 2 Score 0 0 0  PHQ- 9 Score 0 - -    BP Readings from Last 3 Encounters:  05/03/19 110/68  04/19/19 112/79  04/14/19 (!) 161/85    Physical Exam Vitals and nursing note reviewed.  Constitutional:      General: He is not in acute distress.    Appearance: He is well-developed.  HENT:     Head: Normocephalic and atraumatic.  Cardiovascular:     Rate and Rhythm: Normal rate and regular rhythm.     Heart sounds: No murmur.  Pulmonary:     Effort: Pulmonary effort is normal. No respiratory distress.     Breath sounds: No wheezing or rhonchi.  Abdominal:     General: Abdomen is protuberant.     Palpations: Abdomen is soft.       Comments: Hernia scar healing with steri-strips in place Yellow marks the site of a retained staple that was removed today  Musculoskeletal:        General: Normal range of motion.     Cervical back: Normal range of motion and neck supple.     Right lower leg: No edema.     Left lower leg: No edema.  Lymphadenopathy:     Cervical: No cervical adenopathy.  Skin:    General: Skin is warm and dry.     Findings: No rash.  Neurological:     Mental Status: He is alert and oriented to person, place, and time.  Psychiatric:        Attention and Perception: Attention normal.        Mood and Affect: Mood normal.     Wt Readings from Last 3 Encounters:  05/03/19 232 lb (105.2 kg)  04/19/19 226 lb 12.8 oz (102.9 kg)  04/14/19 240 lb 6.4 oz (109 kg)    BP 110/68    Pulse 86   Temp (!) 96 F (35.6 C) (Temporal)   Ht 5\' 6"  (1.676 m)   Wt 232 lb (105.2 kg)   SpO2 97%   BMI 37.45 kg/m   Assessment and Plan: 1. Essential hypertension Clinically stable exam with well controlled BP on benicar. Tolerating medications without side effects at this time. Pt to continue current regimen and low sodium diet; benefits of regular exercise as able discussed.  2. Prostate cancer ([place]) Followed by Urology Now on anti-androgen therapy and doing well  other than mild hot flashes Continue flax seed and calcium/vit D  3. S/P repair of ventral hernia Retained staple removed Steri strips in place - wound healing well Follow up with Gen Surgery as planned   Partially dictated using Editor, commissioning. Any errors are unintentional.  Halina Maidens, MD [facility]  05/03/2019

## 2019-05-03 NOTE — Patient Instructions (Signed)
[email or web address]  Covid Vaccine phone numbers: [facility] Dept. [place]

## 2019-05-10 ENCOUNTER — Encounter: Payer: 59 | Admitting: Surgery

## 2019-05-21 ENCOUNTER — Ambulatory Visit (INDEPENDENT_AMBULATORY_CARE_PROVIDER_SITE_OTHER): Payer: 59

## 2019-05-21 ENCOUNTER — Ambulatory Visit
Admission: EM | Admit: 2019-05-21 | Discharge: 2019-05-21 | Disposition: A | Discharge status: Home / Self Care | Payer: 59 | Attending: Emergency Medicine | Admitting: Emergency Medicine

## 2019-05-21 ENCOUNTER — Other Ambulatory Visit: Payer: Self-pay

## 2019-05-21 ENCOUNTER — Encounter: Payer: Self-pay | Admitting: Emergency Medicine

## 2019-05-21 DIAGNOSIS — U071 COVID-19: Secondary | ICD-10-CM

## 2019-05-21 DIAGNOSIS — R05 Cough: Secondary | ICD-10-CM | POA: Diagnosis not present

## 2019-05-21 DIAGNOSIS — J189 Pneumonia, unspecified organism: Secondary | ICD-10-CM

## 2019-05-21 MED ORDER — BENZONATATE 200 MG PO CAPS: 200.0000 mg | ORAL_CAPSULE | Freq: Three times a day (TID) | ORAL | 0 refills | Status: DC | PRN

## 2019-05-21 MED ORDER — AMOXICILLIN 500 MG PO TABS: 1000.0000 mg | ORAL_TABLET | Freq: Three times a day (TID) | ORAL | 0 refills | Status: AC

## 2019-05-21 MED ORDER — IBUPROFEN 600 MG PO TABS: 600.0000 mg | ORAL_TABLET | Freq: Four times a day (QID) | ORAL | 0 refills | Status: DC | PRN

## 2019-05-21 NOTE — ED Provider Notes (Signed)
HPI  SUBJECTIVE:  Karl Gentry is a 69 y.o. male who presents with persistent cough productive of white mucus, fatigue, "not feeling well" since being diagnosed with Covid 9 days ago on 4/2.  He reports fevers T-max 101.4 several days ago.  Since.  No body aches headaches nasal congestion sore throat postnasal drip loss of sense of smell or taste sinus pain or pressure wheeze chest pain shortness of breath nausea vomiting diarrhea abdominal pain.  States that he is not able to sleep at night because he does not feel well.  Coughing is not keeping him awake.  No antipyretic in the past 4 to 6 hours.  He has been alternating Tylenol and ibuprofen with fever reduction.  No aggravating factors.  He has history of prostate cancer and quit smoking 40 years ago.  He has history of hypertension.  No history of chronic kidney disease liver disease diabetes coronary artery disease HIV immunocompromise alcoholism asplenia.  No antibiotics in the past 42-month.  PMD: Dr. Army Melia.    Past Medical History:  Diagnosis Date  . Abdominal pain   . 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 Cancer [facility]) 04-13-11   recent dx. prostate cancer, surgery planned  . ED (erectile dysfunction)   . History of elevated PSA 2021   PSA starting to rise again. unable to get all of cancer during surgery  . Hypertension   . Inguinal hernia bilateral, non-recurrent 04-13-11   states bilateral at present / umbilical hernia also  . Prostate cancer ([place]) 04/23/11   bx=Adenocarcinoma,Gleason=4+3=7,PSA=5.5,volume  =36cc  . Radiation 03/02/2012-04/20/2012   prostate bed 6600 cGy  . Shortness of breath   . Stress incontinence, male   . Ventral hernia without obstruction or gangrene 06/21/2018    Past Surgical History:  Procedure Laterality Date  . ABDOMINAL WALL DEFECT REPAIR N/A 04/06/2019   Procedure: REPAIR ABDOMINAL WALL;  Surgeon: Jules Husbands, MD;  Location: [facility];  Service: General;  Laterality: N/A;  .  APPENDECTOMY  04/06/2019   Procedure: APPENDECTOMY;  Surgeon: Jules Husbands, MD;  Location: [facility];  Service: General;;  . BIOPSY PROSTATE  2013   bx. 2'13 [place] [facility]  . HERNIA REPAIR Right 1977    -open rt. right inguinal hernia repair/mesh  . INGUINAL HERNIA REPAIR Bilateral 04/06/2019   Procedure: HERNIA REPAIR INGUINAL ADULT BILATERAL;  Surgeon: Jules Husbands, MD;  Location: [facility];  Service: General;  Laterality: Bilateral;  . ROBOT ASSISTED LAPAROSCOPIC RADICAL PROSTATECTOMY  04/23/2011   Procedure: ROBOTIC ASSISTED LAPAROSCOPIC RADICAL PROSTATECTOMY LEVEL 2;  Surgeon: Dutch Gray, MD;  [facility];  Service: Urology;  Laterality: N/A;    . VENTRAL HERNIA REPAIR N/A 04/06/2019   Procedure: HERNIA REPAIR VENTRAL ADULT;  Surgeon: Jules Husbands, MD;  Location: [facility];  Service: General;  Laterality: N/A;    Family History  Problem Relation Age of Onset  . Cancer Mother        breast age 47  . Cancer Father        colon age 74    Social History   Tobacco Use  . Smoking status: Former Smoker    Years: 10.00    Types: Pipe    Quit date: 1970    Years since quitting: 51.3  . Smokeless tobacco: Former Systems developer    Types: Chew  Substance Use Topics  . Alcohol use: No  . Drug use: No    No current facility-administered medications for  this encounter.  Current Outpatient Medications:  .  calcium citrate (CALCITRATE - DOSED IN MG ELEMENTAL CALCIUM) 950 (200 Ca) MG tablet, Take 200 mg of elemental calcium by mouth daily., Disp: , Rfl:  .  Flaxseed, Linseed, (FLAXSEED OIL) 1000 MG CAPS, Take by mouth., Disp: , Rfl:  .  Leuprolide Acetate (ELIGARD ), Inject into the skin. PRN, Disp: , Rfl:  .  olmesartan (BENICAR) 20 MG tablet, Take 1 tablet (20 mg total) by mouth daily., Disp: 90 tablet, Rfl: 3 .  amoxicillin (AMOXIL) 500 MG tablet, Take 2 tablets (1,000 mg total) by mouth 3 (three) times daily for 5 days., Disp: 30 tablet, Rfl: 0 .  benzonatate (TESSALON) 200  MG capsule, Take 1 capsule (200 mg total) by mouth 3 (three) times daily as needed for cough., Disp: 30 capsule, Rfl: 0 .  ibuprofen (ADVIL) 600 MG tablet, Take 1 tablet (600 mg total) by mouth every 6 (six) hours as needed., Disp: 30 tablet, Rfl: 0  Allergies  Allergen Reactions  . Molds & Smuts     Mildew and cats also cause irritated eyes, itching, running nose     ROS  As noted in HPI.   Physical Exam  BP 133/75 (BP Location: Left Arm)   Pulse 86   Temp 98.8 F (37.1 C) (Oral)   Resp 16   Ht 5\' 6"  (1.676 m)   Wt 100.7 kg   SpO2 95%   BMI 35.83 kg/m   Constitutional: Well developed, well nourished, no acute distress Eyes:  EOMI, conjunctiva normal bilaterally HENT: Normocephalic, atraumatic,mucus membranes moist. No nasal congestion. No maxillary/ frontal sinus tenderness. No PND.  Respiratory: Normal inspiratory effort good air movement.  Faint crackles posterior left lower lobe no wheezing, rhonchi cardiovascular: Normal rate regular rhythm no murmurs rubs or gallops GI: nondistended skin: No rash, skin intact Musculoskeletal: no deformities Neurologic: Alert & oriented x 3, no focal neuro deficits Psychiatric: Speech and behavior appropriate   ED Course   Medications - No data to display  Orders Placed This Encounter  Procedures  . DG Chest 2 View    Standing Status:   Standing    Number of Occurrences:   1    Order Specific Question:   Reason for Exam (SYMPTOM  OR DIAGNOSIS REQUIRED)    Answer:   covid + fever r/o secondary PNA    No results found for this or any previous visit (from the past 24 hour(s)). DG Chest 2 View  Result Date: 05/21/2019 CLINICAL DATA:  COVID-19 positive.  Fever. EXAM: CHEST - 2 VIEW COMPARISON:  04/08/2019 and 09/30/2014 FINDINGS: Lungs are adequately inflated demonstrate chronic patchy linear density over the left base. Mild basilar patchy opacification on the lateral film which may be due to early infection. Cardiomediastinal  silhouette and remainder of the exam is unchanged. IMPRESSION: Mild patchy density over the lung bases on the lateral film which may be due to early infection. Stable chronic changes left base. Electronically Signed   By: Marin Olp M.D.   On: 05/21/2019 09:35    ED Clinical Impression  1. Community acquired pneumonia, unspecified laterality   2. COVID-19 virus infection      ED Assessment/Plan  We will check x-ray to rule out secondary pneumonia given persistent fevers.  Reviewed imaging independently.  Stable chronic changes left base, mild patchy densities over the lung bases on the lateral film concerning for early infection.  see radiology report for full details.  Patient with COVID-19  and secondary pneumonia.  Tessalon, Mucinex DM, 400 to 600 mg ibuprofen combined with the 1000 mg of Tylenol 3-4 times a day as needed.  We will send home with amoxicillin 1 g 3 times daily for 5 days.  He is otherwise healthy has no comorbidities or risk factors for MDRO.  Advised him to buy a pulse oximeter to monitor O2 levels follow-up with PMD if not getting better in 1 week.  to the ER if he gets worse.   Discussed  imaging, MDM, treatment plan, and plan for follow-up with patient. Discussed sn/sx that should prompt return to the ED. patient agrees with plan.   Meds ordered this encounter  Medications  . amoxicillin (AMOXIL) 500 MG tablet    Sig: Take 2 tablets (1,000 mg total) by mouth 3 (three) times daily for 5 days.    Dispense:  30 tablet    Refill:  0  . benzonatate (TESSALON) 200 MG capsule    Sig: Take 1 capsule (200 mg total) by mouth 3 (three) times daily as needed for cough.    Dispense:  30 capsule    Refill:  0  . ibuprofen (ADVIL) 600 MG tablet    Sig: Take 1 tablet (600 mg total) by mouth every 6 (six) hours as needed.    Dispense:  30 tablet    Refill:  0    *This clinic note was created using [employer]. Therefore, there may be occasional mistakes  despite careful proofreading.   ?    Melynda Ripple, MD 05/21/19 1003

## 2019-05-21 NOTE — ED Triage Notes (Signed)
Patient states that he tested positive for COVID at [facility] on April 2.  Patient c/o ongoing productive cough that has not improved.

## 2019-05-21 NOTE — Discharge Instructions (Addendum)
Tessalon for the cough, start Mucinex DM, 400 to 600 mg ibuprofen combined with 1000 mg of Tylenol 3-4 times a day as needed.  Finish the antibiotic amoxicillin 1 g 3 times daily for 5 days.   buy a pulse oximeter to monitor O2 levels.  Follow-up with Dr. Army Melia if not getting better in 1 week.  to the ER if he gets worse.

## 2019-05-31 ENCOUNTER — Encounter: Payer: Self-pay | Admitting: Surgery

## 2019-05-31 ENCOUNTER — Other Ambulatory Visit: Payer: Self-pay

## 2019-05-31 ENCOUNTER — Ambulatory Visit (INDEPENDENT_AMBULATORY_CARE_PROVIDER_SITE_OTHER): Payer: Self-pay | Admitting: Surgery

## 2019-05-31 VITALS — BP 153/95 | HR 82 | Temp 97.1°F | Ht 66.0 in | Wt 229.0 lb

## 2019-05-31 DIAGNOSIS — Z09 Encounter for follow-up examination after completed treatment for conditions other than malignant neoplasm: Secondary | ICD-10-CM

## 2019-05-31 NOTE — Progress Notes (Signed)
S/p abd wall reconstruction Doing very well No pain + PO, gaining weight Recent covid infection but is recovering  PE NAD Abd: no recurrence , no infection , he had two staples pubic are that I removed  A./P Doing very well May go back to work w/o restrictions

## 2019-05-31 NOTE — Patient Instructions (Signed)
You may return to work anytime.  Follow-up with our office as needed.  Please call and ask to speak with a nurse if you develop questions or concerns.

## 2019-06-01 ENCOUNTER — Ambulatory Visit: Payer: 59 | Attending: Internal Medicine

## 2019-06-01 DIAGNOSIS — Z23 Encounter for immunization: Secondary | ICD-10-CM

## 2019-06-01 NOTE — Progress Notes (Signed)
   Covid-19 Vaccination Clinic  Name:  Karl Gentry    MRN: [id number] DOB: 10-Sep-1950  06/01/2019  [name] was observed post Covid-19 immunization for 15 minutes without incident. He was provided with Vaccine Information Sheet and instruction to access the V-Safe system.   [name] was instructed to call 911 with any severe reactions post vaccine: Marland Kitchen Difficulty breathing  . Swelling of face and throat  . A fast heartbeat  . A bad rash all over body  . Dizziness and weakness   Immunizations Administered    Name Date Dose VIS Date Route   Pfizer COVID-19 Vaccine 06/01/2019  8:21 AM 0.3 mL 04/05/2018 Intramuscular   Manufacturer: [place]   Lot: [id number]   [place]: [id number]

## 2019-06-02 ENCOUNTER — Other Ambulatory Visit: Payer: Self-pay

## 2019-06-02 ENCOUNTER — Encounter: Payer: Self-pay | Admitting: Internal Medicine

## 2019-06-02 ENCOUNTER — Ambulatory Visit (INDEPENDENT_AMBULATORY_CARE_PROVIDER_SITE_OTHER): Payer: 59 | Admitting: Internal Medicine

## 2019-06-02 VITALS — BP 124/68 | HR 98 | Temp 98.7°F | Ht 66.0 in | Wt 228.0 lb

## 2019-06-02 DIAGNOSIS — Z1211 Encounter for screening for malignant neoplasm of colon: Secondary | ICD-10-CM | POA: Diagnosis not present

## 2019-06-02 DIAGNOSIS — J1282 Pneumonia due to coronavirus disease 2019: Secondary | ICD-10-CM

## 2019-06-02 DIAGNOSIS — I10 Essential (primary) hypertension: Secondary | ICD-10-CM

## 2019-06-02 DIAGNOSIS — U071 COVID-19: Secondary | ICD-10-CM | POA: Diagnosis not present

## 2019-06-02 NOTE — Progress Notes (Signed)
Date:  06/02/2019   Name:  Karl Gentry   DOB:  09-25-1950   MRN:  [id number]   Chief Complaint: Pneumonia (Follow up from Covid Pneumonia. Getting better. ) Diagnosed with Covid-19 on 05/12/19.  He presented to the ED on 05/21/19 with fever, chills and was diagnosed with pneumonia.  He was discharged home on amoxicillin, tessalon perles and mucinex and instructed to follow up here. He was also seen yesterday by General surgery to follow up hernia repair in February.  He was cleared to return to work.  Immunization History  Administered Date(s) Administered  . PFIZER SARS-COV-2 Vaccination 06/01/2019  . Pneumococcal Conjugate-13 06/21/2018     DG Chest 2 View CLINICAL DATA:  COVID-19 positive.  Fever.  EXAM: CHEST - 2 VIEW  COMPARISON:  04/08/2019 and 09/30/2014  FINDINGS: Lungs are adequately inflated demonstrate chronic patchy linear density over the left base. Mild basilar patchy opacification on the lateral film which may be due to early infection. Cardiomediastinal silhouette and remainder of the exam is unchanged.  IMPRESSION: Mild patchy density over the lung bases on the lateral film which may be due to early infection. Stable chronic changes left base.  Electronically Signed   By: Marin Olp M.D.   On: 05/21/2019 09:35   HPI  Lab Results  Component Value Date   CREATININE 0.89 04/10/2019   BUN 20 04/10/2019   NA 140 04/10/2019   K 3.5 04/10/2019   CL 107 04/10/2019   CO2 25 04/10/2019   No results found for: CHOL, HDL, LDLCALC, LDLDIRECT, TRIG, CHOLHDL Lab Results  Component Value Date   TSH 2.390 06/21/2018   No results found for: HGBA1C Lab Results  Component Value Date   WBC 5.8 04/10/2019   HGB 10.6 (L) 04/10/2019   HCT 30.9 (L) 04/10/2019   MCV 91.2 04/10/2019   PLT 152 04/10/2019   Lab Results  Component Value Date   ALT 34 06/21/2018   AST 23 06/21/2018   ALKPHOS 57 06/21/2018   BILITOT 0.3 06/21/2018     Review of  Systems  Constitutional: Positive for fatigue (today after covid vaccine yesterday). Negative for appetite change, diaphoresis and fever.  Respiratory: Negative for cough, chest tightness, shortness of breath and wheezing.   Cardiovascular: Negative for chest pain.  Gastrointestinal: Negative for abdominal pain, constipation and diarrhea.  Neurological: Negative for dizziness, light-headedness and headaches.  Psychiatric/Behavioral: Negative for dysphoric mood and sleep disturbance.    Patient Active Problem List   Diagnosis Date Noted  . Shortness of breath 04/08/2019  . S/P repair of ventral hernia 04/06/2019  . Essential hypertension 06/21/2018  . Prostate cancer ([place]) 04/23/2011    Allergies  Allergen Reactions  . Molds & Smuts     Mildew and cats also cause irritated eyes, itching, running nose    Past Surgical History:  Procedure Laterality Date  . ABDOMINAL WALL DEFECT REPAIR N/A 04/06/2019   Procedure: REPAIR ABDOMINAL WALL;  Surgeon: Jules Husbands, MD;  Location: [facility];  Service: General;  Laterality: N/A;  . APPENDECTOMY  04/06/2019   Procedure: APPENDECTOMY;  Surgeon: Jules Husbands, MD;  Location: [facility];  Service: General;;  . BIOPSY PROSTATE  2013   bx. 2'13 [place] [facility]  . HERNIA REPAIR Right 1977    -open rt. right inguinal hernia repair/mesh  . INGUINAL HERNIA REPAIR Bilateral 04/06/2019   Procedure: HERNIA REPAIR INGUINAL ADULT BILATERAL;  Surgeon: Jules Husbands, MD;  Location: [facility];  Service:  General;  Laterality: Bilateral;  . ROBOT ASSISTED LAPAROSCOPIC RADICAL PROSTATECTOMY  04/23/2011   Procedure: ROBOTIC ASSISTED LAPAROSCOPIC RADICAL PROSTATECTOMY LEVEL 2;  Surgeon: Dutch Gray, MD;  [facility]: [facility];  Service: Urology;  Laterality: N/A;    . VENTRAL HERNIA REPAIR N/A 04/06/2019   Procedure: HERNIA REPAIR VENTRAL ADULT;  Surgeon: Jules Husbands, MD;  Location: [facility];  Service: General;  Laterality: N/A;    Social History   Tobacco  Use  . Smoking status: Former Smoker    Years: 10.00    Types: Pipe    Quit date: 1970    Years since quitting: 51.3  . Smokeless tobacco: Former Systems developer    Types: Chew  Substance Use Topics  . Alcohol use: No  . Drug use: No     Medication list has been reviewed and updated.  Current Meds  Medication Sig  . calcium citrate (CALCITRATE - DOSED IN MG ELEMENTAL CALCIUM) 950 (200 Ca) MG tablet Take 200 mg of elemental calcium by mouth daily.  . Flaxseed, Linseed, (FLAXSEED OIL) 1000 MG CAPS Take by mouth.  Marland Kitchen ibuprofen (ADVIL) 600 MG tablet Take 1 tablet (600 mg total) by mouth every 6 (six) hours as needed.  Marland Kitchen Leuprolide Acetate (ELIGARD [place]) Inject into the skin. PRN  . olmesartan (BENICAR) 20 MG tablet Take 1 tablet (20 mg total) by mouth daily.    PHQ 2/9 Scores 06/02/2019 05/03/2019 09/29/2018 06/21/2018  PHQ - 2 Score 0 0 0 0  PHQ- 9 Score 0 0 - -    BP Readings from Last 3 Encounters:  06/02/19 124/68  05/31/19 (!) 153/95  05/21/19 133/75    Physical Exam Vitals and nursing note reviewed.  Constitutional:      General: He is not in acute distress.    Appearance: Normal appearance. He is well-developed.  HENT:     Head: Normocephalic and atraumatic.  Cardiovascular:     Rate and Rhythm: Normal rate and regular rhythm.     Pulses: Normal pulses.  Pulmonary:     Effort: Pulmonary effort is normal. No respiratory distress.     Breath sounds: No wheezing or rhonchi.  Musculoskeletal:     Cervical back: Normal range of motion.     Right lower leg: No edema.     Left lower leg: No edema.  Lymphadenopathy:     Cervical: No cervical adenopathy.  Skin:    General: Skin is warm and dry.     Capillary Refill: Capillary refill takes less than 2 seconds.     Findings: No rash.  Neurological:     General: No focal deficit present.     Mental Status: He is alert and oriented to person, place, and time.  Psychiatric:        Behavior: Behavior normal.        Thought Content:  Thought content normal.     Wt Readings from Last 3 Encounters:  06/02/19 228 lb (103.4 kg)  05/31/19 229 lb (103.9 kg)  05/21/19 222 lb (100.7 kg)    BP 124/68   Pulse 98   Temp 98.7 F (37.1 C) (Temporal)   Ht 5\' 6"  (1.676 m)   Wt 228 lb (103.4 kg)   SpO2 95%   BMI 36.80 kg/m   Assessment and Plan: 1. Pneumonia due to COVID-19 virus Clinically resolved; in fact received covid vaccine yesterday against the recommendations I suspect his mild sx are related to the vaccine He can return to work  2.  Essential hypertension Clinically stable exam with well controlled BP. Tolerating medications without side effects at this time. Pt to continue current regimen and low sodium diet; benefits of regular exercise as able discussed.  3. Colon cancer screening Pt agrees to colonoscopy - last done more than 10 yrs ago - Ambulatory referral to Gastroenterology   Partially dictated using [place]. Any errors are unintentional.  Halina Maidens, MD [facility]  06/02/2019

## 2019-06-27 ENCOUNTER — Ambulatory Visit: Payer: 59 | Attending: Internal Medicine

## 2019-06-27 DIAGNOSIS — Z23 Encounter for immunization: Secondary | ICD-10-CM

## 2019-06-27 NOTE — Progress Notes (Signed)
   Covid-19 Vaccination Clinic  Name:  NORVEL SHALA    MRN: [id number] DOB: [birth year]  06/27/2019  Mr. Noia was observed post Covid-19 immunization for 15 minutes without incident. He was provided with Vaccine Information Sheet and instruction to access the V-Safe system.   Mr. Gales was instructed to call 911 with any severe reactions post vaccine: Marland Kitchen Difficulty breathing  . Swelling of face and throat  . A fast heartbeat  . A bad rash all over body  . Dizziness and weakness   Immunizations Administered    Name Date Dose VIS Date Route   Pfizer COVID-19 Vaccine 06/27/2019 10:02 AM 0.3 mL 04/05/2018 Intramuscular   Manufacturer: [place]   Lot: [id number]   [place]: [id number]

## 2019-07-03 ENCOUNTER — Telehealth (INDEPENDENT_AMBULATORY_CARE_PROVIDER_SITE_OTHER): Payer: Self-pay | Admitting: Gastroenterology

## 2019-07-03 DIAGNOSIS — Z800 Family history of malignant neoplasm of digestive organs: Secondary | ICD-10-CM

## 2019-07-03 DIAGNOSIS — Z1211 Encounter for screening for malignant neoplasm of colon: Secondary | ICD-10-CM

## 2019-07-03 NOTE — Progress Notes (Signed)
Colonoscopy triage completed.  Patient will call me back to provide a date for his Colonoscopy due to his wife is currently out of town, and he needs to discuss date with her.  Gastroenterology Pre-Procedure Review  Request Date: Pt To Call Back Requesting Physician: Dr. Allen Norris possibly [place]  PATIENT REVIEW QUESTIONS: The patient responded to the following health history questions as indicated:    1. Are you having any GI issues? no 2. Do you have a personal history of Polyps? no 3. Do you have a family history of Colon Cancer or Polyps? yes (father colon cancer) 4. Diabetes Mellitus? no 5. Joint replacements in the past 12 months?no 6. Major health problems in the past 3 months?COVID in April. 7. Any artificial heart valves, MVP, or defibrillator?no    MEDICATIONS & ALLERGIES:    Patient reports the following regarding taking any anticoagulation/antiplatelet therapy:   Plavix, Coumadin, Eliquis, Xarelto, Lovenox, Pradaxa, Brilinta, or Effient? no Aspirin? no  Patient confirms/reports the following medications:  Current Outpatient Medications  Medication Sig Dispense Refill  . calcium citrate (CALCITRATE - DOSED IN MG ELEMENTAL CALCIUM) 950 (200 Ca) MG tablet Take 200 mg of elemental calcium by mouth daily.    . Flaxseed, Linseed, (FLAXSEED OIL) 1000 MG CAPS Take by mouth.    Marland Kitchen ibuprofen (ADVIL) 600 MG tablet Take 1 tablet (600 mg total) by mouth every 6 (six) hours as needed. 30 tablet 0  . Leuprolide Acetate (ELIGARD [place]) Inject into the skin. PRN    . olmesartan (BENICAR) 20 MG tablet Take 1 tablet (20 mg total) by mouth daily. 90 tablet 3   No current facility-administered medications for this visit.    Patient confirms/reports the following allergies:  Allergies  Allergen Reactions  . Molds & Smuts     Mildew and cats also cause irritated eyes, itching, running nose    No orders of the defined types were placed in this encounter.   AUTHORIZATION INFORMATION Primary  Insurance: 1D#: Group #:  Secondary Insurance: 1D#: Group #:  SCHEDULE INFORMATION: Date: Patient will call me back to schedule Time: Location:[facility]

## 2019-10-09 ENCOUNTER — Other Ambulatory Visit: Payer: Self-pay | Admitting: Internal Medicine

## 2019-10-09 DIAGNOSIS — I10 Essential (primary) hypertension: Secondary | ICD-10-CM

## 2019-11-03 ENCOUNTER — Encounter: Payer: Self-pay | Admitting: Internal Medicine

## 2019-11-03 ENCOUNTER — Other Ambulatory Visit: Payer: Self-pay

## 2019-11-03 ENCOUNTER — Ambulatory Visit (INDEPENDENT_AMBULATORY_CARE_PROVIDER_SITE_OTHER): Payer: 59 | Admitting: Internal Medicine

## 2019-11-03 VITALS — BP 138/80 | HR 77 | Temp 97.8°F | Ht 66.0 in | Wt 251.0 lb

## 2019-11-03 DIAGNOSIS — Z6841 Body Mass Index (BMI) 40.0 and over, adult: Secondary | ICD-10-CM

## 2019-11-03 DIAGNOSIS — Z23 Encounter for immunization: Secondary | ICD-10-CM | POA: Diagnosis not present

## 2019-11-03 DIAGNOSIS — Z0000 Encounter for general adult medical examination without abnormal findings: Secondary | ICD-10-CM

## 2019-11-03 DIAGNOSIS — I10 Essential (primary) hypertension: Secondary | ICD-10-CM | POA: Diagnosis not present

## 2019-11-03 DIAGNOSIS — Z1211 Encounter for screening for malignant neoplasm of colon: Secondary | ICD-10-CM

## 2019-11-03 DIAGNOSIS — C61 Malignant neoplasm of prostate: Secondary | ICD-10-CM

## 2019-11-03 LAB — POCT URINALYSIS DIPSTICK
Bilirubin, UA: NEGATIVE
Blood, UA: NEGATIVE
Glucose, UA: NEGATIVE
Ketones, UA: NEGATIVE
Leukocytes, UA: NEGATIVE
Nitrite, UA: NEGATIVE
Protein, UA: NEGATIVE
Spec Grav, UA: 1.01 (ref 1.010–1.025)
Urobilinogen, UA: 0.2 E.U./dL
pH, UA: 6 (ref 5.0–8.0)

## 2019-11-03 NOTE — Progress Notes (Signed)
Date:  11/03/2019   Name:  Karl Gentry   DOB:  1950/09/10   MRN:  [id number]   Chief Complaint: Annual Exam, Hypertension (follow up ), Flu Vaccine, and pneumonia vaccine ([id number])  Karl Gentry is a 69 y.o. male who presents today for his Complete Annual Exam. He feels well. He reports exercising none. He reports he is sleeping poorly. 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  He is still undergoing treatment for prostate cancer with hormonal injections.   Colonoscopy: none  Immunization History  Administered Date(s) Administered  . PFIZER SARS-COV-2 Vaccination 06/01/2019, 06/27/2019  . Pneumococcal Conjugate-13 06/21/2018    Hypertension This is a chronic problem. The problem is controlled. Pertinent negatives include no chest pain, headaches, palpitations or shortness of breath. Past treatments include angiotensin blockers. The current treatment provides significant improvement. There are no compliance problems.     Lab Results  Component Value Date   CREATININE 0.89 04/10/2019   BUN 20 04/10/2019   NA 140 04/10/2019   K 3.5 04/10/2019   CL 107 04/10/2019   CO2 25 04/10/2019   No results found for: CHOL, HDL, LDLCALC, LDLDIRECT, TRIG, CHOLHDL Lab Results  Component Value Date   TSH 2.390 06/21/2018   No results found for: HGBA1C Lab Results  Component Value Date   WBC 5.8 04/10/2019   HGB 10.6 (L) 04/10/2019   HCT 30.9 (L) 04/10/2019   MCV 91.2 04/10/2019   PLT 152 04/10/2019   Lab Results  Component Value Date   ALT 34 06/21/2018   AST 23 06/21/2018   ALKPHOS 57 06/21/2018   BILITOT 0.3 06/21/2018     Review of Systems  Constitutional: Positive for unexpected weight change. Negative for appetite change, chills, diaphoresis and fatigue.  HENT: Negative for hearing loss, tinnitus, trouble swallowing and voice change.   Eyes: Negative for visual disturbance.  Respiratory: Negative for choking, shortness of breath and  wheezing.   Cardiovascular: Negative for chest pain, palpitations and leg swelling.  Gastrointestinal: Negative for abdominal pain, blood in stool, constipation and diarrhea.  Genitourinary: Negative for difficulty urinating, dysuria and frequency.  Musculoskeletal: Negative for arthralgias, back pain and myalgias.  Skin: Negative for color change and rash.  Neurological: Negative for dizziness, syncope and headaches.  Hematological: Negative for adenopathy.  Psychiatric/Behavioral: Negative for dysphoric mood and sleep disturbance.    Patient Active Problem List   Diagnosis Date Noted  . S/P repair of ventral hernia 04/06/2019  . Essential hypertension 06/21/2018  . Prostate cancer ([place]) 04/23/2011    Allergies  Allergen Reactions  . Molds & Smuts     Mildew and cats also cause irritated eyes, itching, running nose    Past Surgical History:  Procedure Laterality Date  . ABDOMINAL WALL DEFECT REPAIR N/A 04/06/2019   Procedure: REPAIR ABDOMINAL WALL;  Surgeon: Jules Husbands, MD;  Location: [facility];  Service: General;  Laterality: N/A;  . APPENDECTOMY  04/06/2019   Procedure: APPENDECTOMY;  Surgeon: Jules Husbands, MD;  Location: [facility];  Service: General;;  . BIOPSY PROSTATE  2013   bx. 2'13 [place] [facility]  . HERNIA REPAIR Right 1977    -open rt. right inguinal hernia repair/mesh  . INGUINAL HERNIA REPAIR Bilateral 04/06/2019   Procedure: HERNIA REPAIR INGUINAL ADULT BILATERAL;  Surgeon: Jules Husbands, MD;  Location: [facility];  Service: General;  Laterality: Bilateral;  . ROBOT ASSISTED LAPAROSCOPIC RADICAL PROSTATECTOMY  04/23/2011   Procedure: ROBOTIC ASSISTED  LAPAROSCOPIC RADICAL PROSTATECTOMY LEVEL 2;  Surgeon: Dutch Gray, MD;  Location: [facility];  Service: Urology;  Laterality: N/A;    . VENTRAL HERNIA REPAIR N/A 04/06/2019   Procedure: HERNIA REPAIR VENTRAL ADULT;  Surgeon: Jules Husbands, MD;  Location: [facility];  Service: General;  Laterality: N/A;    Social  History   Tobacco Use  . Smoking status: Former Smoker    Years: 10.00    Types: Pipe    Quit date: 1970    Years since quitting: 51.7  . Smokeless tobacco: Former Systems developer    Types: Secondary school teacher  . Vaping Use: Never used  Substance Use Topics  . Alcohol use: No  . Drug use: No     Medication list has been reviewed and updated.  Current Meds  Medication Sig  . calcium citrate (CALCITRATE - DOSED IN MG ELEMENTAL CALCIUM) 950 (200 Ca) MG tablet Take 200 mg of elemental calcium by mouth daily.  Marland Kitchen Leuprolide Acetate (ELIGARD ) Inject into the skin. PRN  . olmesartan (BENICAR) 20 MG tablet TAKE 1 TABLET BY MOUTH EVERY DAY    PHQ 2/9 Scores 06/02/2019 05/03/2019 09/29/2018 06/21/2018  PHQ - 2 Score 0 0 0 0  PHQ- 9 Score 0 0 - -    GAD 7 : Generalized Anxiety Score 06/02/2019  Nervous, Anxious, on Edge 0  Control/stop worrying 0  Worry too much - different things 0  Trouble relaxing 0  Restless 0  Easily annoyed or irritable 0  Afraid - awful might happen 0  Total GAD 7 Score 0  Anxiety Difficulty Not difficult at all    BP Readings from Last 3 Encounters:  11/03/19 138/80  06/02/19 124/68  05/31/19 (!) 153/95    Physical Exam Vitals and nursing note reviewed.  Constitutional:      Appearance: Normal appearance. He is well-developed.  HENT:     Head: Normocephalic.     Right Ear: Tympanic membrane, ear canal and external ear normal.     Left Ear: Tympanic membrane, ear canal and external ear normal.     Nose: Nose normal.     Mouth/Throat:     Pharynx: Uvula midline.  Eyes:     Conjunctiva/sclera: Conjunctivae normal.     Pupils: Pupils are equal, round, and reactive to light.  Neck:     Thyroid: No thyromegaly.     Vascular: No carotid bruit.  Cardiovascular:     Rate and Rhythm: Normal rate and regular rhythm.     Heart sounds: Normal heart sounds.  Pulmonary:     Effort: Pulmonary effort is normal.     Breath sounds: Normal breath sounds. No wheezing.    Chest:     Breasts:        Right: No mass.        Left: No mass.  Abdominal:     General: Bowel sounds are normal.     Palpations: Abdomen is soft.     Tenderness: There is no abdominal tenderness.  Musculoskeletal:     Cervical back: Normal range of motion and neck supple.     Right lower leg: No edema.     Left lower leg: No edema.  Lymphadenopathy:     Cervical: No cervical adenopathy.  Skin:    General: Skin is warm and dry.     Capillary Refill: Capillary refill takes less than 2 seconds.  Neurological:     General: No focal deficit present.  Mental Status: He is alert and oriented to person, place, and time.     Deep Tendon Reflexes: Reflexes are normal and symmetric.  Psychiatric:        Speech: Speech normal.        Behavior: Behavior normal.        Thought Content: Thought content normal.        Judgment: Judgment normal.     Wt Readings from Last 3 Encounters:  11/03/19 251 lb (113.9 kg)  06/02/19 228 lb (103.4 kg)  05/31/19 229 lb (103.9 kg)    BP 138/80 (BP Location: Right Arm, Patient Position: Sitting)   Pulse 77   Temp 97.8 F (36.6 C) (Oral)   Ht 5\' 6"  (1.676 m)   Wt 251 lb (113.9 kg)   SpO2 97%   BMI 40.51 kg/m   Assessment and Plan: 1. Annual physical exam Exam is normal except for weight. Encourage regular exercise and appropriate dietary changes. Discussed ways to reduce snacking and help with weight loss - Lipid panel - Hemoglobin A1c - POCT urinalysis dipstick  2. Colon cancer screening FIT test ordered Will discuss Colonoscopy next year  3. Essential hypertension Clinically stable exam with well controlled BP. Tolerating medications without side effects at this time. Pt to continue current regimen and low sodium diet; benefits of regular exercise as able discussed. - CBC with Differential/Platelet - Comprehensive metabolic panel  4. BMI 40.0-44.9, adult ([place]) Work on diet for weight loss  5. Need for vaccination for  pneumococcus - Pneumococcal polysaccharide vaccine 23-valent greater than or equal to 2yo subcutaneous/IM  6. Prostate cancer ([place]) Under active treatment with Oncology/Urology   Partially dictated using Editor, commissioning. Any errors are unintentional.  Halina Maidens, MD [facility]  11/03/2019

## 2019-11-04 LAB — CBC WITH DIFFERENTIAL/PLATELET
Basophils Absolute: 0 10*3/uL (ref 0.0–0.2)
Basos: 1 %
EOS (ABSOLUTE): 0.2 10*3/uL (ref 0.0–0.4)
Eos: 4 %
Hematocrit: 36.6 % — ABNORMAL LOW (ref 37.5–51.0)
Hemoglobin: 12.6 g/dL — ABNORMAL LOW (ref 13.0–17.7)
Immature Grans (Abs): 0 10*3/uL (ref 0.0–0.1)
Immature Granulocytes: 0 %
Lymphocytes Absolute: 1 10*3/uL (ref 0.7–3.1)
Lymphs: 19 %
MCH: 29.8 pg (ref 26.6–33.0)
MCHC: 34.4 g/dL (ref 31.5–35.7)
MCV: 87 fL (ref 79–97)
Monocytes Absolute: 0.6 10*3/uL (ref 0.1–0.9)
Monocytes: 11 %
Neutrophils Absolute: 3.3 10*3/uL (ref 1.4–7.0)
Neutrophils: 65 %
Platelets: 185 10*3/uL (ref 150–450)
RBC: 4.23 x10E6/uL (ref 4.14–5.80)
RDW: 13.1 % (ref 11.6–15.4)
WBC: 5.1 10*3/uL (ref 3.4–10.8)

## 2019-11-04 LAB — COMPREHENSIVE METABOLIC PANEL
ALT: 19 IU/L (ref 0–44)
AST: 19 IU/L (ref 0–40)
Albumin/Globulin Ratio: 1.9 (ref 1.2–2.2)
Albumin: 4.6 g/dL (ref 3.8–4.8)
Alkaline Phosphatase: 68 IU/L (ref 44–121)
BUN/Creatinine Ratio: 19 (ref 10–24)
BUN: 15 mg/dL (ref 8–27)
Bilirubin Total: 0.4 mg/dL (ref 0.0–1.2)
CO2: 25 mmol/L (ref 20–29)
Calcium: 9.6 mg/dL (ref 8.6–10.2)
Chloride: 100 mmol/L (ref 96–106)
Creatinine, Ser: 0.77 mg/dL (ref 0.76–1.27)
GFR calc Af Amer: 108 mL/min/{1.73_m2} (ref >59)
GFR calc non Af Amer: 93 mL/min/{1.73_m2} (ref >59)
Globulin, Total: 2.4 g/dL (ref 1.5–4.5)
Glucose: 106 mg/dL — ABNORMAL HIGH (ref 65–99)
Potassium: 4.9 mmol/L (ref 3.5–5.2)
Sodium: 138 mmol/L (ref 134–144)
Total Protein: 7 g/dL (ref 6.0–8.5)

## 2019-11-04 LAB — HEMOGLOBIN A1C
Est. average glucose Bld gHb Est-mCnc: 117 mg/dL
Hgb A1c MFr Bld: 5.7 % — ABNORMAL HIGH (ref 4.8–5.6)

## 2019-11-04 LAB — LIPID PANEL
Chol/HDL Ratio: 3.3 ratio (ref 0.0–5.0)
Cholesterol, Total: 172 mg/dL (ref 100–199)
HDL: 52 mg/dL (ref >39)
LDL Chol Calc (NIH): 102 mg/dL — ABNORMAL HIGH (ref 0–99)
Triglycerides: 98 mg/dL (ref 0–149)
VLDL Cholesterol Cal: 18 mg/dL (ref 5–40)

## 2020-06-24 ENCOUNTER — Ambulatory Visit
Admission: EM | Admit: 2020-06-24 | Discharge: 2020-06-24 | Disposition: A | Discharge status: Home / Self Care | Payer: Managed Care, Other (non HMO) | Attending: Emergency Medicine | Admitting: Emergency Medicine

## 2020-06-24 ENCOUNTER — Encounter: Payer: Self-pay | Admitting: Emergency Medicine

## 2020-06-24 DIAGNOSIS — L02211 Cutaneous abscess of abdominal wall: Secondary | ICD-10-CM | POA: Diagnosis not present

## 2020-06-24 MED ORDER — DOXYCYCLINE HYCLATE 100 MG PO CAPS: 100.0000 mg | ORAL_CAPSULE | Freq: Two times a day (BID) | ORAL | 0 refills | Status: DC

## 2020-06-24 NOTE — ED Provider Notes (Signed)
[facility]    CSN: [id number] Arrival date & time: 06/24/20  1106      History   Chief Complaint Chief Complaint  Patient presents with  . Rash    HPI Karl Gentry is a 69 y.o. male.   HPI   70 year old male here for evaluation of itchy painful rash.  Patient reports that he first developed an itchy painful area on the right side of his abdomen on the underside of his pannus where his belt and waistband rubs 2 weeks ago.  In the interim it has spread across to the other side of his abdomen where he reports that it is starting to drain some bloody drainage.  He denies any fever.  Past Medical History:  Diagnosis Date  . Abdominal pain   . 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 Cancer [facility]) 04-13-11   recent dx. prostate cancer, surgery planned  . ED (erectile dysfunction)   . History of elevated PSA 2021   PSA starting to rise again. unable to get all of cancer during surgery  . Hypertension   . Inguinal hernia bilateral, non-recurrent 04-13-11   states bilateral at present / umbilical hernia also  . Prostate cancer ([place]) 04/23/11   bx=Adenocarcinoma,Gleason=4+3=7,PSA=5.5,volume  =36cc  . Radiation 03/02/2012-04/20/2012   prostate bed 6600 cGy  . Shortness of breath   . Stress incontinence, male   . Ventral hernia without obstruction or gangrene 06/21/2018    Patient Active Problem List   Diagnosis Date Noted  . BMI 40.0-44.9, adult ([place]) 11/03/2019  . S/P repair of ventral hernia 04/06/2019  . Essential hypertension 06/21/2018  . Prostate cancer ([place]) 04/23/2011    Past Surgical History:  Procedure Laterality Date  . ABDOMINAL WALL DEFECT REPAIR N/A 04/06/2019   Procedure: REPAIR ABDOMINAL WALL;  Surgeon: Jules Husbands, MD;  Location: [facility];  Service: General;  Laterality: N/A;  . APPENDECTOMY  04/06/2019   Procedure: APPENDECTOMY;  Surgeon: Jules Husbands, MD;  Location: [facility];  Service: General;;  . BIOPSY PROSTATE  2013   bx.  2'13 [place] [facility]  . HERNIA REPAIR Right 1977    -open rt. right inguinal hernia repair/mesh  . INGUINAL HERNIA REPAIR Bilateral 04/06/2019   Procedure: HERNIA REPAIR INGUINAL ADULT BILATERAL;  Surgeon: Jules Husbands, MD;  Location: [facility];  Service: General;  Laterality: Bilateral;  . ROBOT ASSISTED LAPAROSCOPIC RADICAL PROSTATECTOMY  04/23/2011   Procedure: ROBOTIC ASSISTED LAPAROSCOPIC RADICAL PROSTATECTOMY LEVEL 2;  Surgeon: Dutch Gray, MD;  [facility];  Service: Urology;  Laterality: N/A;    . VENTRAL HERNIA REPAIR N/A 04/06/2019   Procedure: HERNIA REPAIR VENTRAL ADULT;  Surgeon: Jules Husbands, MD;  Location: [facility];  Service: General;  Laterality: N/A;       Home Medications    Prior to Admission medications   Medication Sig Start Date End Date Taking? Authorizing Provider  calcium citrate (CALCITRATE - DOSED IN MG ELEMENTAL CALCIUM) 950 (200 Ca) MG tablet Take 200 mg of elemental calcium by mouth daily.   Yes [provider]  doxycycline (VIBRAMYCIN) 100 MG capsule Take 1 capsule (100 mg total) by mouth 2 (two) times daily. 06/24/20  Yes Margarette Canada, NP  Leuprolide Acetate (ELIGARD [place]) Inject into the skin. PRN   Yes [provider]  olmesartan (BENICAR) 20 MG tablet TAKE 1 TABLET BY MOUTH EVERY DAY 10/09/19  Yes Glean Hess, MD    Family History Family History  Problem Relation Age of Onset  . Cancer Mother        breast age 57  . Cancer Father        colon age 31    Social History Social History   Tobacco Use  . Smoking status: Former Smoker    Years: 10.00    Types: Pipe    Quit date: 1970    Years since quitting: 52.4  . Smokeless tobacco: Former Systems developer    Types: Secondary school teacher  . Vaping Use: Never used  Substance Use Topics  . Alcohol use: No  . Drug use: No     Allergies   Molds & smuts   Review of Systems Review of Systems  Constitutional: Negative for activity change, appetite change and fever.   Skin: Positive for color change and wound.  Hematological: Negative.   Psychiatric/Behavioral: Negative.      Physical Exam Triage Vital Signs ED Triage Vitals  Enc Vitals Group     BP 06/24/20 1158 137/78     Pulse Rate 06/24/20 1158 69     Resp 06/24/20 1158 18     Temp 06/24/20 1158 98 F (36.7 C)     Temp Source 06/24/20 1158 Oral     SpO2 06/24/20 1158 99 %     Weight 06/24/20 1157 260 lb (117.9 kg)     Height 06/24/20 1157 5\' 6"  (1.676 m)     Head Circumference --      Peak Flow --      Pain Score 06/24/20 1157 0     Pain Loc --      Pain Edu? --      Excl. in [place]? --    No data found.  Updated Vital Signs BP 137/78 (BP Location: Left Arm)   Pulse 69   Temp 98 F (36.7 C) (Oral)   Resp 18   Ht 5\' 6"  (1.676 m)   Wt 260 lb (117.9 kg)   SpO2 99%   BMI 41.97 kg/m   Visual Acuity Right Eye Distance:   Left Eye Distance:   Bilateral Distance:    Right Eye Near:   Left Eye Near:    Bilateral Near:     Physical Exam Vitals and nursing note reviewed.  Constitutional:      General: He is not in acute distress.    Appearance: Normal appearance. He is obese.  HENT:     Head: Normocephalic and atraumatic.  Skin:    General: Skin is warm and dry.     Capillary Refill: Capillary refill takes less than 2 seconds.     Findings: Erythema and lesion present.  Neurological:     General: No focal deficit present.     Mental Status: He is alert.  Psychiatric:        Mood and Affect: Mood normal.        Behavior: Behavior normal.        Thought Content: Thought content normal.        Judgment: Judgment normal.      UC Treatments / Results  Labs (all labs ordered are listed, but only abnormal results are displayed) Labs Reviewed - No data to display  EKG   Radiology No results found.  Procedures Procedures (including critical care time)  Medications Ordered in UC Medications - No data to display  Initial Impression / Assessment and Plan / UC  Course  I have reviewed the triage vital signs and the nursing  notes.  Pertinent labs & imaging results that were available during my care of the patient were reviewed by me and considered in my medical decision making (see chart for details).   Patient of an itchy painful rash that has had on his lower abdomen for the past 2 weeks that is also draining a bloody drainage.  He reports that the rash started off on the right side of his abdomen and has since moved to the left side of his abdomen and is below his waistline on the underside of his pannus where his waistband rubs.  Patient decided to come be evaluated because he was concerned that he might have shingles.  Physical exam reveals an open sore the size of a pencil eraser that is draining a serosanguineous pus.  The surrounding tissue is erythematous and is approximately the size of a quarter.  There is no induration or fluctuance.  I am unable to express any pus from the wound tract.  Patient's physical exam is consistent with ruptured abscess with surrounding cellulitis.  We will treat patient with doxycycline twice daily for 10 days and have him return for any new or worsening symptoms.   Final Clinical Impressions(s) / UC Diagnoses   Final diagnoses:  Abdominal wall abscess     Discharge Instructions     Keep the area that is draining covered with a Band-Aid during the day while at work to prevent soiling undergarments.  Take the doxycycline twice daily with food for treatment of the soft tissue skin infection (cellulitis).  If you have an increase in pain, swelling, redness at the site, or drainage return for reevaluation.  Make sure you wear sunscreen when you are out of doors while taking the doxycycline as it will make you more prone to sunburn.    ED Prescriptions    Medication Sig Dispense Auth. Provider   doxycycline (VIBRAMYCIN) 100 MG capsule Take 1 capsule (100 mg total) by mouth 2 (two) times daily. 20 capsule Margarette Canada, NP     PDMP not reviewed this encounter.   Margarette Canada, NP 06/24/20 1213

## 2020-06-24 NOTE — Discharge Instructions (Addendum)
Keep the area that is draining covered with a Band-Aid during the day while at work to prevent soiling undergarments.  Take the doxycycline twice daily with food for treatment of the soft tissue skin infection (cellulitis).  If you have an increase in pain, swelling, redness at the site, or drainage return for reevaluation.  Make sure you wear sunscreen when you are out of doors while taking the doxycycline as it will make you more prone to sunburn.

## 2020-06-24 NOTE — ED Triage Notes (Signed)
Patient c/o itchy, painful rash to abdomen that started 2 weeks ago.

## 2020-09-07 ENCOUNTER — Other Ambulatory Visit: Payer: Self-pay

## 2020-09-07 ENCOUNTER — Ambulatory Visit
Admission: EM | Admit: 2020-09-07 | Discharge: 2020-09-07 | Disposition: A | Discharge status: Home / Self Care | Payer: Managed Care, Other (non HMO) | Attending: Family Medicine | Admitting: Family Medicine

## 2020-09-07 ENCOUNTER — Encounter: Payer: Self-pay | Admitting: Emergency Medicine

## 2020-09-07 DIAGNOSIS — S39012A Strain of muscle, fascia and tendon of lower back, initial encounter: Secondary | ICD-10-CM | POA: Diagnosis not present

## 2020-09-07 MED ORDER — BACLOFEN 10 MG PO TABS: 10.0000 mg | ORAL_TABLET | Freq: Three times a day (TID) | ORAL | 0 refills | Status: DC | PRN

## 2020-09-07 NOTE — Discharge Instructions (Addendum)
Heat.  Medication as directed.  Take care  Dr. Jenet Durio  

## 2020-09-07 NOTE — ED Provider Notes (Signed)
[facility]    CSN: [id number] Arrival date & time: 09/07/20  [id number]      History   Chief Complaint Chief Complaint  Patient presents with   Back Pain    HPI 70 year old male presents with the above complaint.  Patient reports left-sided low back pain which started on Monday.  He states that it occurred after he did a lot of bending down at work.  He reports tightness and pain in the left-sided low back.  Pain 5/10 in severity.  No radicular symptoms.  No saddle anesthesia or incontinence.  He states that he took a hot shower and felt some improvement this morning.  No other associated symptoms.  No other complaints.  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 prostate cancer, surgery planned   ED (erectile dysfunction)    History of elevated PSA 2021   PSA starting to rise again. unable to get all of cancer during surgery   Hypertension    Inguinal hernia bilateral, non-recurrent 04-13-11   states bilateral at present / umbilical hernia also   Prostate cancer ([place]) 04/23/11   bx=Adenocarcinoma,Gleason=4+3=7,PSA=5.5,volume  =36cc   Radiation 03/02/2012-04/20/2012   prostate bed 6600 cGy   Shortness of breath    Stress incontinence, male    Ventral hernia without obstruction or gangrene 06/21/2018    Patient Active Problem List   Diagnosis Date Noted   BMI 40.0-44.9, adult ([place]) 11/03/2019   S/P repair of ventral hernia 04/06/2019   Essential hypertension 06/21/2018   Prostate cancer ([place]) 04/23/2011    Past Surgical History:  Procedure Laterality Date   ABDOMINAL WALL DEFECT REPAIR N/A 04/06/2019   Procedure: REPAIR ABDOMINAL WALL;  Surgeon: Jules Husbands, MD;  Location: [facility];  Service: General;  Laterality: N/A;   APPENDECTOMY  04/06/2019   Procedure: APPENDECTOMY;  Surgeon: Jules Husbands, MD;  Location: [facility];  Service: General;;   BIOPSY PROSTATE  2013    bx. 2'13 [place] Right 1977    -open rt. right inguinal hernia repair/mesh   INGUINAL HERNIA REPAIR Bilateral 04/06/2019   Procedure: HERNIA REPAIR INGUINAL ADULT BILATERAL;  Surgeon: Jules Husbands, MD;  Location: [facility];  Service: General;  Laterality: Bilateral;   ROBOT ASSISTED LAPAROSCOPIC RADICAL PROSTATECTOMY  04/23/2011   Procedure: ROBOTIC ASSISTED LAPAROSCOPIC RADICAL PROSTATECTOMY LEVEL 2;  Surgeon: Dutch Gray, MD;  Location: [facility];  Service: Urology;  Laterality: N/A;     VENTRAL HERNIA REPAIR N/A 04/06/2019   Procedure: HERNIA REPAIR VENTRAL ADULT;  Surgeon: Jules Husbands, MD;  Location: [facility];  Service: General;  Laterality: N/A;       Home Medications    Prior to Admission medications   Medication Sig Start Date End Date Taking? Authorizing Provider  baclofen (LIORESAL) 10 MG tablet Take 1 tablet (10 mg total) by mouth 3 (three) times daily as needed for muscle spasms. 09/07/20  Yes Shalie Schremp G, DO  olmesartan (BENICAR) 20 MG tablet TAKE 1 TABLET BY MOUTH EVERY DAY 10/09/19  Yes Glean Hess, MD  Leuprolide Acetate (ELIGARD [place]) Inject into the skin. PRN    [provider]    Family History Family History  Problem Relation Age of Onset   Cancer Mother        breast age 38   Cancer Father  colon age 41    Social History Social History   Tobacco Use   Smoking status: Former    Types: Pipe    Quit date: 1970    Years since quitting: 52.6   Smokeless tobacco: Former    Types: Nurse, children's Use: Never used  Substance Use Topics   Alcohol use: No   Drug use: No     Allergies   Molds & smuts   Review of Systems Review of Systems  Constitutional: Negative.   Musculoskeletal:        Low back pain   Physical Exam Triage Vital Signs ED Triage Vitals  Enc Vitals Group     BP 09/07/20 0848 137/70     Pulse Rate 09/07/20 0848 71     Resp 09/07/20 0848 16     Temp 09/07/20 0848  98.2 F (36.8 C)     Temp Source 09/07/20 0848 Oral     SpO2 09/07/20 0848 100 %     Weight 09/07/20 0845 260 lb (117.9 kg)     Height 09/07/20 0845 '5\' 6"'$  (1.676 m)     Head Circumference --      Peak Flow --      Pain Score 09/07/20 0844 5     Pain Loc --      Pain Edu? --      Excl. in [place]? --    No data found.  Updated Vital Signs BP 137/70 (BP Location: Right Arm)   Pulse 71   Temp 98.2 F (36.8 C) (Oral)   Resp 16   Ht '5\' 6"'$  (1.676 m)   Wt 117.9 kg   SpO2 100%   BMI 41.97 kg/m   Visual Acuity Right Eye Distance:   Left Eye Distance:   Bilateral Distance:    Right Eye Near:   Left Eye Near:    Bilateral Near:     Physical Exam Constitutional:      General: He is not in acute distress.    Appearance: Normal appearance. He is obese. He is not ill-appearing.  HENT:     Head: Normocephalic and atraumatic.  Cardiovascular:     Rate and Rhythm: Normal rate and regular rhythm.     Heart sounds: No murmur heard. Pulmonary:     Effort: Pulmonary effort is normal.     Breath sounds: Normal breath sounds. No wheezing, rhonchi or rales.  Musculoskeletal:     Comments: Lumbar spine -tenderness over the left paraspinal musculature.  Neurological:     Mental Status: He is alert.  Psychiatric:        Mood and Affect: Mood normal.        Behavior: Behavior normal.    UC Treatments / Results  Labs (all labs ordered are listed, but only abnormal results are displayed) Labs Reviewed - No data to display  EKG   Radiology No results found.  Procedures Procedures (including critical care time)  Medications Ordered in UC Medications - No data to display  Initial Impression / Assessment and Plan / UC Course  I have reviewed the triage vital signs and the nursing notes.  Pertinent labs & imaging results that were available during my care of the patient were reviewed by me and considered in my medical decision making (see chart for details).    70 year old male  presents with lumbar strain.  Advise heat.  Rest.  Baclofen as directed.  Final Clinical Impressions(s) / UC Diagnoses  Final diagnoses:  Strain of lumbar region, initial encounter     Discharge Instructions      Heat.  Medication as directed.  Take care  Dr. Lacinda Axon    ED Prescriptions     Medication Sig Dispense Auth. Provider   baclofen (LIORESAL) 10 MG tablet Take 1 tablet (10 mg total) by mouth 3 (three) times daily as needed for muscle spasms. 5 each Coral Spikes, DO      PDMP not reviewed this encounter.   Thersa Salt [place], [place] 09/07/20 [phone]

## 2020-09-07 NOTE — ED Triage Notes (Signed)
Patient c/o lower back pain and muscle spasm that started on Monday.  Patient states that he was bending down to pick something up and felt pain in his lower back.

## 2020-10-13 ENCOUNTER — Other Ambulatory Visit: Payer: Self-pay | Admitting: Internal Medicine

## 2020-10-13 DIAGNOSIS — I10 Essential (primary) hypertension: Secondary | ICD-10-CM

## 2020-10-13 NOTE — Telephone Encounter (Signed)
Requested medication (s) are due for refill today: yes  Requested medication (s) are on the active medication list: yes  Last refill:  10/09/19  Future visit scheduled: yes  Notes to clinic:  overdue lab work, > 3 month overdue appt   Requested Prescriptions  Pending Prescriptions Disp Refills   olmesartan (BENICAR) 20 MG tablet [Pharmacy Med Name: OLMESARTAN MEDOXOMIL 20 MG TAB] 90 tablet 3    Sig: TAKE 1 TABLET BY MOUTH EVERY DAY     Cardiovascular:  Angiotensin Receptor Blockers Failed - 10/13/2020  9:18 AM      Failed - Cr in normal range and within 180 days    Creatinine, Ser  Date Value Ref Range Status  11/03/2019 0.77 0.76 - 1.27 mg/dL Final          Failed - K in normal range and within 180 days    Potassium  Date Value Ref Range Status  11/03/2019 4.9 3.5 - 5.2 mmol/L Final          Failed - Valid encounter within last 6 months    Recent Outpatient Visits           11 months ago Annual physical exam   [facility] Glean Hess, MD   1 year ago Pneumonia due to COVID-19 virus   [facility] Glean Hess, MD   1 year ago Essential hypertension   [place], Laura H, MD   2 years ago Essential hypertension   [facility] Glean Hess, MD   2 years ago Cardiac arrhythmia, unspecified cardiac arrhythmia type   [facility] Glean Hess, MD       Future Appointments             In 3 weeks Glean Hess, MD [facility], [place] - Patient is not pregnant      Passed - Last BP in normal range    BP Readings from Last 1 Encounters:  09/07/20 137/70

## 2020-11-04 NOTE — Progress Notes (Signed)
Date:  11/05/2020   Name:  Karl Gentry   DOB:  16-Dec-1950   MRN:  [id number]   Chief Complaint: Annual Exam Karl Gentry is a 70 y.o. male who presents today for his Complete Annual Exam. He feels well. He reports exercising - none. He reports he is sleeping fairly well.   Colonoscopy: none - FIT ordered last year but not done.  Immunization History  Administered Date(s) Administered   Fluad Quad(high Dose 65+) 11/03/2019   PFIZER(Purple Top)SARS-COV-2 Vaccination 06/01/2019, 06/27/2019   Pneumococcal Conjugate-13 06/21/2018   Pneumococcal Polysaccharide-23 11/03/2019    Hypertension This is a chronic problem. The problem is controlled. Pertinent negatives include no chest pain, headaches, palpitations or shortness of breath. Past treatments include angiotensin blockers. The current treatment provides significant improvement. There are no compliance problems.  There is no history of kidney disease, CAD/MI or CVA.   Prostate Cancer - dx 2013 s/p surgery.  Followed by Urology. On Leuprolide injections for rising PSA which is currently undetectable.  Lab Results  Component Value Date   CREATININE 0.77 11/03/2019   BUN 15 11/03/2019   NA 138 11/03/2019   K 4.9 11/03/2019   CL 100 11/03/2019   CO2 25 11/03/2019   Lab Results  Component Value Date   CHOL 172 11/03/2019   HDL 52 11/03/2019   LDLCALC 102 (H) 11/03/2019   TRIG 98 11/03/2019   CHOLHDL 3.3 11/03/2019   Lab Results  Component Value Date   TSH 2.390 06/21/2018   Lab Results  Component Value Date   HGBA1C 5.7 (H) 11/03/2019   Lab Results  Component Value Date   WBC 5.1 11/03/2019   HGB 12.6 (L) 11/03/2019   HCT 36.6 (L) 11/03/2019   MCV 87 11/03/2019   PLT 185 11/03/2019   Lab Results  Component Value Date   ALT 19 11/03/2019   AST 19 11/03/2019   ALKPHOS 68 11/03/2019   BILITOT 0.4 11/03/2019     Review of Systems  Constitutional:  Negative for appetite change, chills, diaphoresis,  fatigue and unexpected weight change.  HENT:  Negative for hearing loss, tinnitus, trouble swallowing and voice change.   Eyes:  Negative for visual disturbance.  Respiratory:  Negative for choking, shortness of breath and wheezing.   Cardiovascular:  Negative for chest pain, palpitations and leg swelling.  Gastrointestinal:  Negative for abdominal pain, blood in stool, constipation and diarrhea.  Genitourinary:  Negative for difficulty urinating, dysuria and frequency.  Musculoskeletal:  Positive for arthralgias (both shoulders). Negative for back pain and myalgias.  Skin:  Negative for color change and rash.  Neurological:  Negative for dizziness, syncope and headaches.  Hematological:  Negative for adenopathy.  Psychiatric/Behavioral:  Negative for dysphoric mood and sleep disturbance. The patient is not nervous/anxious.    Patient Active Problem List   Diagnosis Date Noted   BMI 40.0-44.9, adult ([place]) 11/03/2019   S/P repair of ventral hernia 04/06/2019   Essential hypertension 06/21/2018   Prostate cancer ([place]) 04/23/2011    Allergies  Allergen Reactions   Molds & Smuts     Mildew and cats also cause irritated eyes, itching, running nose    Past Surgical History:  Procedure Laterality Date   ABDOMINAL WALL DEFECT REPAIR N/A 04/06/2019   Procedure: REPAIR ABDOMINAL WALL;  Surgeon: Jules Husbands, MD;  Location: [facility];  Service: General;  Laterality: N/A;   APPENDECTOMY  04/06/2019   Procedure: APPENDECTOMY;  Surgeon: Jules Husbands, MD;  Location: [facility];  Service: General;;   BIOPSY PROSTATE  2013   bx. 2'13 [place] Right 1977    -open rt. right inguinal hernia repair/mesh   INGUINAL HERNIA REPAIR Bilateral 04/06/2019   Procedure: HERNIA REPAIR INGUINAL ADULT BILATERAL;  Surgeon: Jules Husbands, MD;  Location: [facility];  Service: General;  Laterality: Bilateral;   ROBOT ASSISTED LAPAROSCOPIC RADICAL PROSTATECTOMY  04/23/2011   Procedure:  ROBOTIC ASSISTED LAPAROSCOPIC RADICAL PROSTATECTOMY LEVEL 2;  Surgeon: Dutch Gray, MD;  [facility];  Service: Urology;  Laterality: N/A;     VENTRAL HERNIA REPAIR N/A 04/06/2019   Procedure: HERNIA REPAIR VENTRAL ADULT;  Surgeon: Jules Husbands, MD;  Location: [facility];  Service: General;  Laterality: N/A;    Social History   Tobacco Use   Smoking status: Former    Types: Pipe    Quit date: 1970    Years since quitting: 52.7   Smokeless tobacco: Former    Types: Nurse, children's Use: Never used  Substance Use Topics   Alcohol use: No   Drug use: No     Medication list has been reviewed and updated.  Current Meds  Medication Sig   Leuprolide Acetate (ELIGARD [place]) Inject into the skin. PRN   olmesartan (BENICAR) 20 MG tablet TAKE 1 TABLET BY MOUTH EVERY DAY    PHQ 2/9 Scores 11/05/2020 06/02/2019 05/03/2019 09/29/2018  PHQ - 2 Score 0 0 0 0  PHQ- 9 Score 0 0 0 -    GAD 7 : Generalized Anxiety Score 11/05/2020 06/02/2019  Nervous, Anxious, on Edge 0 0  Control/stop worrying 0 0  Worry too much - different things 0 0  Trouble relaxing 0 0  Restless 0 0  Easily annoyed or irritable 0 0  Afraid - awful might happen 0 0  Total GAD 7 Score 0 0  Anxiety Difficulty Not difficult at all Not difficult at all    BP Readings from Last 3 Encounters:  11/05/20 138/82  09/07/20 137/70  06/24/20 137/78    Physical Exam Vitals and nursing note reviewed.  Constitutional:      Appearance: Normal appearance. He is well-developed.  HENT:     Head: Normocephalic.     Right Ear: Tympanic membrane, ear canal and external ear normal.     Left Ear: Tympanic membrane, ear canal and external ear normal.     Nose: Nose normal.  Eyes:     Conjunctiva/sclera: Conjunctivae normal.     Pupils: Pupils are equal, round, and reactive to light.  Neck:     Thyroid: No thyromegaly.     Vascular: No carotid bruit.  Cardiovascular:     Rate and Rhythm: Normal rate and regular rhythm.      Heart sounds: Normal heart sounds.  Pulmonary:     Effort: Pulmonary effort is normal.     Breath sounds: Normal breath sounds. No wheezing.  Chest:  Breasts:    Right: No mass.     Left: No mass.  Abdominal:     General: Bowel sounds are normal.     Palpations: Abdomen is soft.     Tenderness: There is no abdominal tenderness.  Musculoskeletal:        General: Normal range of motion.     Cervical back: Normal range of motion and neck supple.  Lymphadenopathy:     Cervical: No cervical adenopathy.  Skin:    General: Skin is warm  and dry.     Capillary Refill: Capillary refill takes less than 2 seconds.  Neurological:     General: No focal deficit present.     Mental Status: He is alert and oriented to person, place, and time.     Deep Tendon Reflexes: Reflexes are normal and symmetric.  Psychiatric:        Attention and Perception: Attention normal.        Mood and Affect: Mood normal.        Thought Content: Thought content normal.    Wt Readings from Last 3 Encounters:  11/05/20 255 lb (115.7 kg)  09/07/20 260 lb (117.9 kg)  06/24/20 260 lb (117.9 kg)    BP 138/82 (BP Location: Right Arm, Patient Position: Sitting, Cuff Size: Large)   Pulse 78   Ht 5\' 6"  (1.676 m)   Wt 255 lb (115.7 kg)   SpO2 99%   BMI 41.16 kg/m   Assessment and Plan: 1. Annual physical exam Exam is normal except for weight. Encourage regular exercise and appropriate dietary changes. - Lipid panel  2. Colon cancer screening - Ambulatory referral to Gastroenterology  3. Essential hypertension Clinically stable exam with well controlled BP. Tolerating medications without side effects at this time. Pt to continue current regimen and low sodium diet; benefits of regular exercise as able discussed. DASH diet - CBC with Differential/Platelet - Comprehensive metabolic panel - POCT urinalysis dipstick - olmesartan (BENICAR) 20 MG tablet; Take 1 tablet (20 mg total) by mouth daily.   Dispense: 90 tablet; Refill: 1  4. Need for hepatitis C screening test - Hepatitis C antibody  5. Prostate cancer [facility]) Followed by Urology  6. BMI 40.0-44.9, adult ([place]) - Hemoglobin A1c   Partially dictated using Editor, commissioning. Any errors are unintentional.  Halina Maidens, MD [facility]  11/05/2020

## 2020-11-05 ENCOUNTER — Encounter: Payer: Self-pay | Admitting: Internal Medicine

## 2020-11-05 ENCOUNTER — Other Ambulatory Visit: Payer: Self-pay

## 2020-11-05 ENCOUNTER — Ambulatory Visit (INDEPENDENT_AMBULATORY_CARE_PROVIDER_SITE_OTHER): Payer: Managed Care, Other (non HMO) | Admitting: Internal Medicine

## 2020-11-05 VITALS — BP 138/82 | HR 78 | Ht 66.0 in | Wt 255.0 lb

## 2020-11-05 DIAGNOSIS — Z1211 Encounter for screening for malignant neoplasm of colon: Secondary | ICD-10-CM

## 2020-11-05 DIAGNOSIS — Z1159 Encounter for screening for other viral diseases: Secondary | ICD-10-CM | POA: Diagnosis not present

## 2020-11-05 DIAGNOSIS — Z23 Encounter for immunization: Secondary | ICD-10-CM

## 2020-11-05 DIAGNOSIS — C61 Malignant neoplasm of prostate: Secondary | ICD-10-CM

## 2020-11-05 DIAGNOSIS — Z0000 Encounter for general adult medical examination without abnormal findings: Secondary | ICD-10-CM | POA: Diagnosis not present

## 2020-11-05 DIAGNOSIS — I10 Essential (primary) hypertension: Secondary | ICD-10-CM | POA: Diagnosis not present

## 2020-11-05 DIAGNOSIS — Z6841 Body Mass Index (BMI) 40.0 and over, adult: Secondary | ICD-10-CM

## 2020-11-05 LAB — POCT URINALYSIS DIPSTICK
Bilirubin, UA: NEGATIVE
Glucose, UA: NEGATIVE
Ketones, UA: NEGATIVE
Leukocytes, UA: NEGATIVE
Nitrite, UA: NEGATIVE
Protein, UA: NEGATIVE
Spec Grav, UA: >=1.03 — AB (ref 1.010–1.025)
Urobilinogen, UA: 0.2 E.U./dL
pH, UA: 6 (ref 5.0–8.0)

## 2020-11-05 MED ORDER — OLMESARTAN MEDOXOMIL 20 MG PO TABS: 20.0000 mg | ORAL_TABLET | Freq: Every day | ORAL | 1 refills | Status: DC

## 2020-11-06 ENCOUNTER — Encounter: Payer: Self-pay | Admitting: Internal Medicine

## 2020-11-06 ENCOUNTER — Telehealth: Payer: Self-pay

## 2020-11-06 ENCOUNTER — Other Ambulatory Visit: Payer: Self-pay

## 2020-11-06 DIAGNOSIS — Z1211 Encounter for screening for malignant neoplasm of colon: Secondary | ICD-10-CM

## 2020-11-06 DIAGNOSIS — R7303 Prediabetes: Secondary | ICD-10-CM | POA: Insufficient documentation

## 2020-11-06 DIAGNOSIS — Z800 Family history of malignant neoplasm of digestive organs: Secondary | ICD-10-CM

## 2020-11-06 LAB — CBC WITH DIFFERENTIAL/PLATELET
Basophils Absolute: 0.1 10*3/uL (ref 0.0–0.2)
Basos: 1 %
EOS (ABSOLUTE): 0.2 10*3/uL (ref 0.0–0.4)
Eos: 3 %
Hematocrit: 44.3 % (ref 37.5–51.0)
Hemoglobin: 14.4 g/dL (ref 13.0–17.7)
Immature Grans (Abs): 0 10*3/uL (ref 0.0–0.1)
Immature Granulocytes: 0 %
Lymphocytes Absolute: 1 10*3/uL (ref 0.7–3.1)
Lymphs: 17 %
MCH: 28.8 pg (ref 26.6–33.0)
MCHC: 32.5 g/dL (ref 31.5–35.7)
MCV: 89 fL (ref 79–97)
Monocytes Absolute: 0.6 10*3/uL (ref 0.1–0.9)
Monocytes: 9 %
Neutrophils Absolute: 4.2 10*3/uL (ref 1.4–7.0)
Neutrophils: 70 %
Platelets: 196 10*3/uL (ref 150–450)
RBC: 5 x10E6/uL (ref 4.14–5.80)
RDW: 12.9 % (ref 11.6–15.4)
WBC: 6 10*3/uL (ref 3.4–10.8)

## 2020-11-06 LAB — HEPATITIS C ANTIBODY: Hep C Virus Ab: <0.1 s/co ratio (ref 0.0–0.9)

## 2020-11-06 LAB — COMPREHENSIVE METABOLIC PANEL
ALT: 26 IU/L (ref 0–44)
AST: 22 IU/L (ref 0–40)
Albumin/Globulin Ratio: 1.6 (ref 1.2–2.2)
Albumin: 4.6 g/dL (ref 3.8–4.8)
Alkaline Phosphatase: 73 IU/L (ref 44–121)
BUN/Creatinine Ratio: 18 (ref 10–24)
BUN: 18 mg/dL (ref 8–27)
Bilirubin Total: 0.6 mg/dL (ref 0.0–1.2)
CO2: 23 mmol/L (ref 20–29)
Calcium: 10 mg/dL (ref 8.6–10.2)
Chloride: 102 mmol/L (ref 96–106)
Creatinine, Ser: 1 mg/dL (ref 0.76–1.27)
Globulin, Total: 2.8 g/dL (ref 1.5–4.5)
Glucose: 113 mg/dL — ABNORMAL HIGH (ref 70–99)
Potassium: 4.7 mmol/L (ref 3.5–5.2)
Sodium: 140 mmol/L (ref 134–144)
Total Protein: 7.4 g/dL (ref 6.0–8.5)
eGFR: 81 mL/min/{1.73_m2} (ref >59)

## 2020-11-06 LAB — LIPID PANEL
Chol/HDL Ratio: 4 ratio (ref 0.0–5.0)
Cholesterol, Total: 189 mg/dL (ref 100–199)
HDL: 47 mg/dL (ref >39)
LDL Chol Calc (NIH): 122 mg/dL — ABNORMAL HIGH (ref 0–99)
Triglycerides: 113 mg/dL (ref 0–149)
VLDL Cholesterol Cal: 20 mg/dL (ref 5–40)

## 2020-11-06 LAB — HEMOGLOBIN A1C
Est. average glucose Bld gHb Est-mCnc: 131 mg/dL
Hgb A1c MFr Bld: 6.2 % — ABNORMAL HIGH (ref 4.8–5.6)

## 2020-11-06 MED ORDER — NA SULFATE-K SULFATE-MG SULF 17.5-3.13-1.6 GM/177ML PO SOLN: 354.0000 mL | Freq: Once | ORAL | 0 refills | Status: AC

## 2020-11-06 NOTE — Telephone Encounter (Signed)
Gastroenterology Pre-Procedure Review  Request Date: 12/04/2020 Requesting Physician: Dr. Marius Ditch  PATIENT REVIEW QUESTIONS: The patient responded to the following health history questions as indicated:    1. Are you having any GI issues? no 2. Do you have a personal history of Polyps? no 3. Do you have a family history of Colon Cancer or Polyps? Farther had colon cancer  4. Diabetes Mellitus? no 5. Joint replacements in the past 12 months?no 6. Major health problems in the past 3 months?no 7. Any artificial heart valves, MVP, or defibrillator?no    MEDICATIONS & ALLERGIES:    Patient reports the following regarding taking any anticoagulation/antiplatelet therapy:   Plavix, Coumadin, Eliquis, Xarelto, Lovenox, Pradaxa, Brilinta, or Effient? no Aspirin? no  Patient confirms/reports the following medications:  Current Outpatient Medications  Medication Sig Dispense Refill   Leuprolide Acetate (ELIGARD [place]) Inject into the skin. PRN     olmesartan (BENICAR) 20 MG tablet Take 1 tablet (20 mg total) by mouth daily. 90 tablet 1   No current facility-administered medications for this visit.    Patient confirms/reports the following allergies:  Allergies  Allergen Reactions   Molds & Smuts     Mildew and cats also cause irritated eyes, itching, running nose    No orders of the defined types were placed in this encounter.   AUTHORIZATION INFORMATION Primary Insurance: 1D#: Group #:  Secondary Insurance: 1D#: Group #:  SCHEDULE INFORMATION: Date:  Time: Location:

## 2020-11-26 ENCOUNTER — Encounter: Payer: Self-pay | Admitting: General Surgery

## 2020-12-04 ENCOUNTER — Encounter
Admission: RE | Disposition: A | Discharge status: Home / Self Care | Payer: Self-pay | Source: Home / Self Care | Attending: Gastroenterology

## 2020-12-04 ENCOUNTER — Ambulatory Visit
Admission: RE | Admit: 2020-12-04 | Discharge: 2020-12-04 | Disposition: A | Discharge status: Home / Self Care | Payer: Managed Care, Other (non HMO) | Attending: Gastroenterology | Admitting: Gastroenterology

## 2020-12-04 ENCOUNTER — Ambulatory Visit: Payer: Managed Care, Other (non HMO) | Admitting: Anesthesiology

## 2020-12-04 ENCOUNTER — Encounter: Payer: Self-pay | Admitting: Gastroenterology

## 2020-12-04 DIAGNOSIS — Z800 Family history of malignant neoplasm of digestive organs: Secondary | ICD-10-CM | POA: Insufficient documentation

## 2020-12-04 DIAGNOSIS — Z79899 Other long term (current) drug therapy: Secondary | ICD-10-CM | POA: Insufficient documentation

## 2020-12-04 DIAGNOSIS — Z9079 Acquired absence of other genital organ(s): Secondary | ICD-10-CM | POA: Diagnosis not present

## 2020-12-04 DIAGNOSIS — K635 Polyp of colon: Secondary | ICD-10-CM

## 2020-12-04 DIAGNOSIS — K644 Residual hemorrhoidal skin tags: Secondary | ICD-10-CM | POA: Insufficient documentation

## 2020-12-04 DIAGNOSIS — Z87891 Personal history of nicotine dependence: Secondary | ICD-10-CM | POA: Insufficient documentation

## 2020-12-04 DIAGNOSIS — Z1211 Encounter for screening for malignant neoplasm of colon: Secondary | ICD-10-CM

## 2020-12-04 DIAGNOSIS — Z923 Personal history of irradiation: Secondary | ICD-10-CM | POA: Insufficient documentation

## 2020-12-04 DIAGNOSIS — Z9049 Acquired absence of other specified parts of digestive tract: Secondary | ICD-10-CM | POA: Diagnosis not present

## 2020-12-04 DIAGNOSIS — Z8546 Personal history of malignant neoplasm of prostate: Secondary | ICD-10-CM | POA: Insufficient documentation

## 2020-12-04 HISTORY — PX: COLONOSCOPY WITH PROPOFOL: SHX5780

## 2020-12-04 SURGERY — COLONOSCOPY WITH PROPOFOL
Anesthesia: General

## 2020-12-04 MED ORDER — PROPOFOL 500 MG/50ML IV EMUL
INTRAVENOUS | Status: DC | PRN
  Administered 2020-12-04: 165 ug/kg/min via INTRAVENOUS

## 2020-12-04 MED ORDER — LIDOCAINE HCL (CARDIAC) PF 100 MG/5ML IV SOSY
PREFILLED_SYRINGE | INTRAVENOUS | Status: DC | PRN
  Administered 2020-12-04: 100 mg via INTRAVENOUS

## 2020-12-04 MED ORDER — PROPOFOL 10 MG/ML IV BOLUS
INTRAVENOUS | Status: DC | PRN
  Administered 2020-12-04: 60 mg via INTRAVENOUS

## 2020-12-04 MED ORDER — GLYCOPYRROLATE 0.2 MG/ML IJ SOLN
INTRAMUSCULAR | Status: DC | PRN
  Administered 2020-12-04: .2 mg via INTRAVENOUS

## 2020-12-04 MED ORDER — SODIUM CHLORIDE 0.9 % IV SOLN: INTRAVENOUS | Status: DC

## 2020-12-04 NOTE — Transfer of Care (Signed)
Immediate Anesthesia Transfer of Care Note  Patient: Karl Gentry  Procedure(s) Performed: COLONOSCOPY WITH PROPOFOL  Patient Location: Endoscopy Unit  Anesthesia Type:General  Level of Consciousness: awake, drowsy and patient cooperative  Airway & Oxygen Therapy: Patient Spontanous Breathing and Patient connected to nasal cannula oxygen  Post-op Assessment: Report given to RN and Post -op Vital signs reviewed and stable  Post vital signs: Reviewed and stable  Last Vitals:  Vitals Value Taken Time  BP 92/56 12/04/20 1034  Temp 36.5 C 12/04/20 1033  Pulse 78 12/04/20 1034  Resp 14 12/04/20 1034  SpO2 100 % 12/04/20 1034  Vitals shown include unvalidated device data.  Last Pain:  Vitals:   12/04/20 1033  TempSrc: Temporal  PainSc: Asleep         Complications: No notable events documented.

## 2020-12-04 NOTE — H&P (Signed)
Cephas Darby, MD [address]  [place]  [place], [place] [address]  Main: [phone]  Fax: [phone] Pager: [phone]  Primary Care Physician:  Glean Hess, MD Primary Gastroenterologist:  Dr. Cephas Darby  Pre-Procedure History & Physical: HPI:  Karl Gentry is a 70 y.o. male is here for an colonoscopy.   Past Medical History:  Diagnosis Date   Abdominal pain    Arthritis    right shoulder, no cartillage   Cancer ([place]) 04-13-11   recent dx. prostate cancer, surgery planned   ED (erectile dysfunction)    History of elevated PSA 2021   PSA starting to rise again. 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 2'13 [name] Right 1977    -open rt. right inguinal hernia repair/mesh   INGUINAL HERNIA REPAIR Bilateral 04/06/2019   Procedure: HERNIA REPAIR INGUINAL ADULT BILATERAL;  Surgeon: Jules Husbands, MD;  Location: [facility];  Service: General;  Laterality: Bilateral;   ROBOT ASSISTED LAPAROSCOPIC RADICAL PROSTATECTOMY  04/23/2011   Procedure: ROBOTIC ASSISTED LAPAROSCOPIC RADICAL PROSTATECTOMY LEVEL 2;  Surgeon: Dutch Gray, MD;  [facility];  Service: Urology;   Laterality: N/A;     VENTRAL HERNIA REPAIR N/A 04/06/2019   Procedure: HERNIA REPAIR VENTRAL ADULT;  Surgeon: Jules Husbands, MD;  Location: [facility];  Service: General;  Laterality: N/A;    Prior to Admission medications   Medication Sig Start Date End Date Taking? Authorizing Provider  olmesartan (BENICAR) 20 MG tablet Take 1 tablet (20 mg total) by mouth daily. 11/05/20  Yes Glean Hess, MD  Leuprolide Acetate (ELIGARD [place]) Inject into the skin. PRN    [provider]    Allergies as of 11/06/2020 - Review Complete 11/05/2020  Allergen Reaction Noted   Molds & smuts  04/03/2019    Family History  Problem Relation Age of Onset   Cancer Mother        breast age 62   Cancer Father        colon age 87    Social History   Socioeconomic History   Marital status: Married    Spouse name: Ivin Booty   Number of children: 2   Years of education: Not on file   Highest education level: Not on file  Occupational History    Employer: [employer]  Tobacco Use   Smoking status: Former    Types: Pipe    Quit date: 1970    Years since quitting: 52.8   Smokeless tobacco: Former    Types: Nurse, children's Use: Never used  Substance and Sexual Activity   Alcohol use: No   Drug use:  No   Sexual activity: Yes  Other Topics Concern   Not on file  Social History Narrative   Not on file   Social Determinants of Health   Financial Resource Strain: Not on file  Food Insecurity: Not on file  Transportation Needs: Not on file  Physical Activity: Not on file  Stress: Not on file  Social Connections: Not on file  Intimate Partner Violence: Not on file    Review of Systems: See HPI, otherwise negative ROS  Physical Exam: BP (!) 145/78   Pulse 89   Temp (!) 97 F (36.1 C) (Tympanic)   Resp 16   Ht 5\' 6"  (1.676 m)   Wt 117.9 kg   SpO2 98%   BMI 41.97 kg/m  General:   Alert,  pleasant and cooperative in NAD Head:  Normocephalic and  atraumatic. Neck:  Supple; no masses or thyromegaly. Lungs:  Clear throughout to auscultation.    Heart:  Regular rate and rhythm. Abdomen:  Soft, nontender and nondistended. Normal bowel sounds, without guarding, and without rebound.   Neurologic:  Alert and  oriented x4;  grossly normal neurologically.  Impression/Plan: Karl Gentry is here for an colonoscopy to be performed for colon cancer screening  Risks, benefits, limitations, and alternatives regarding  colonoscopy have been reviewed with the patient.  Questions have been answered.  All parties agreeable.   Sherri Sear, MD  12/04/2020, 10:15 AM

## 2020-12-04 NOTE — Anesthesia Procedure Notes (Signed)
Procedure Name: General with mask airway Date/Time: 12/04/2020 10:22 AM Performed by: Kelton Pillar, CRNA Pre-anesthesia Checklist: Patient identified, Emergency Drugs available, Suction available and Patient being monitored Patient Re-evaluated:Patient Re-evaluated prior to induction Oxygen Delivery Method: Simple face mask Induction Type: IV induction Placement Confirmation: positive ETCO2, CO2 detector and breath sounds checked- equal and bilateral Dental Injury: Teeth and Oropharynx as per pre-operative assessment

## 2020-12-04 NOTE — Op Note (Signed)
[facility] Gastroenterology Patient Name: Karl Gentry Procedure Date: 12/04/2020 9:56 AM MRN: [id number] Account #: [id number] Date of Birth: 08/07/1950 Admit Type: Outpatient Age: 70 Room: [facility] ENDO ROOM 4 Gender: Male Note Status: Finalized Instrument Name: Colonoscope [id number] Procedure:             Colonoscopy Indications:           Screening for colorectal malignant neoplasm, Last                         colonoscopy: January 2006, Last colonoscopy 10 years                         ago Providers:             Lin Landsman MD, MD Referring MD:          Halina Maidens, MD (Referring MD) Medicines:             General Anesthesia Complications:         No immediate complications. Estimated blood loss: None. Procedure:             Pre-Anesthesia Assessment:                        - Prior to the procedure, a History and Physical was                         performed, and patient medications and allergies were                         reviewed. The patient is competent. The risks and                         benefits of the procedure and the sedation options and                         risks were discussed with the patient. All questions                         were answered and informed consent was obtained.                         Patient identification and proposed procedure were                         verified by the physician, the nurse, the                         anesthesiologist, the anesthetist and the technician                         in the pre-procedure area in the procedure room in the                         endoscopy suite. Mental Status Examination: alert and                         oriented. Airway Examination: normal oropharyngeal  airway and neck mobility. Respiratory Examination:                         clear to auscultation. CV Examination: normal.                         Prophylactic Antibiotics: The patient does  not require                         prophylactic antibiotics. Prior Anticoagulants: The                         patient has taken no previous anticoagulant or                         antiplatelet agents. ASA Grade Assessment: III - A                         patient with severe systemic disease. After reviewing                         the risks and benefits, the patient was deemed in                         satisfactory condition to undergo the procedure. The                         anesthesia plan was to use general anesthesia.                         Immediately prior to administration of medications,                         the patient was re-assessed for adequacy to receive                         sedatives. The heart rate, respiratory rate, oxygen                         saturations, blood pressure, adequacy of pulmonary                         ventilation, and response to care were monitored                         throughout the procedure. The physical status of the                         patient was re-assessed after the procedure.                        After obtaining informed consent, the colonoscope was                         passed under direct vision. Throughout the procedure,                         the patient's blood pressure, pulse, and oxygen  saturations were monitored continuously. The                         Colonoscope was introduced through the anus and                         advanced to the the cecum, identified by appendiceal                         orifice and ileocecal valve. The colonoscopy was                         performed without difficulty. The patient tolerated                         the procedure well. The quality of the bowel                         preparation was evaluated using the BBPS [facility] Bowel                         Preparation Scale) with scores of: Right Colon = 3,                         Transverse Colon = 3 and  Left Colon = 3 (entire mucosa                         seen well with no residual staining, small fragments                         of stool or opaque liquid). The total BBPS score                         equals 9. Findings:      The perianal and digital rectal examinations were normal. Pertinent       negatives include normal sphincter tone and no palpable rectal lesions.      A diminutive polyp was found in the cecum. The polyp was sessile. The       polyp was removed with a cold biopsy forceps. Resection and retrieval       were complete.      A 5 mm polyp was found in the descending colon. The polyp was sessile.       The polyp was removed with a cold snare. Resection and retrieval were       complete. Estimated blood loss: none.      Non-bleeding external hemorrhoids were found during retroflexion. The       hemorrhoids were medium-sized. Impression:            - One diminutive polyp in the cecum, removed with a                         cold biopsy forceps. Resected and retrieved.                        - One 5 mm polyp in the descending colon, removed with  a cold snare. Resected and retrieved.                        - Non-bleeding external hemorrhoids. Recommendation:        - Discharge patient to home (with escort).                        - Resume previous diet today.                        - Continue present medications.                        - Await pathology results.                        - Repeat colonoscopy in 5 to 7 years for surveillance. Procedure Code(s):     --- Professional ---                        [phone], Colonoscopy, flexible; with removal of                         tumor(s), polyp(s), or other lesion(s) by snare                         technique                        [id number], 27, Colonoscopy, flexible; with biopsy, single                         or multiple Diagnosis Code(s):     --- Professional ---                        Z12.11, Encounter  for screening for malignant neoplasm                         of colon                        K63.5, Polyp of colon                        K64.4, Residual hemorrhoidal skin tags CPT copyright 2019 American Medical Association. All rights reserved. The codes documented in this report are preliminary and upon coder review may  be revised to meet current compliance requirements. Dr. Ulyess Mort Lin Landsman MD, MD 12/04/2020 10:33:30 AM This report has been signed electronically. Number of Addenda: 0 Note Initiated On: 12/04/2020 9:56 AM Scope Withdrawal Time: 0 hours 9 minutes 44 seconds  Total Procedure Duration: 0 hours 11 minutes 59 seconds  Estimated Blood Loss:  Estimated blood loss: none.      [facility]

## 2020-12-04 NOTE — Anesthesia Postprocedure Evaluation (Signed)
Anesthesia Post Note  Patient: Karl Gentry  Procedure(s) Performed: COLONOSCOPY WITH PROPOFOL  Patient location during evaluation: Endoscopy Anesthesia Type: General Level of consciousness: awake and alert Pain management: pain level controlled Vital Signs Assessment: post-procedure vital signs reviewed and stable Respiratory status: spontaneous breathing, nonlabored ventilation and respiratory function stable Cardiovascular status: blood pressure returned to baseline and stable Postop Assessment: no apparent nausea or vomiting Anesthetic complications: no   No notable events documented.   Last Vitals:  Vitals:   12/04/20 1043 12/04/20 1103  BP:  128/67  Pulse:    Resp: 16   Temp:    SpO2: 97%     Last Pain:  Vitals:   12/04/20 1103  TempSrc:   PainSc: 0-No pain                 [place] Ouch

## 2020-12-04 NOTE — Anesthesia Preprocedure Evaluation (Addendum)
Anesthesia Evaluation  Patient identified by MRN, date of birth, ID band Patient awake    Reviewed: Allergy & Precautions, H&P , NPO status , Patient's Chart, lab work & pertinent test results, reviewed documented beta blocker date and time   Airway Mallampati: II  TM Distance: >3 FB Neck ROM: Full    Dental  (+) Teeth Intact   Pulmonary neg pulmonary ROS, former smoker,    breath sounds clear to auscultation       Cardiovascular hypertension, Pt. on medications  Rhythm:Regular Rate:Normal  Denies cardiac symptoms   Neuro/Psych negative neurological ROS  negative psych ROS   GI/Hepatic negative GI ROS, Neg liver ROS,   Endo/Other  Morbid obesity  Renal/GU negative Renal ROS   Prostate cancer    Musculoskeletal  (+) Arthritis ,   Abdominal Normal abdominal exam  (+)   Peds negative pediatric ROS (+)  Hematology negative hematology ROS (+)   Anesthesia Other Findings Past Medical History: No date: Abdominal pain No date: Arthritis     Comment:  right shoulder, no cartillage 04-13-11: Cancer (HCC)     Comment:  recent dx. prostate cancer, surgery planned No date: ED (erectile dysfunction) 2021: History of elevated PSA     Comment:  PSA starting to rise again. unable to get all of cancer               during surgery No date: Hypertension 04-13-11: Inguinal hernia bilateral, non-recurrent     Comment:  states bilateral at present / umbilical hernia also 09/19/89: Prostate cancer [facility])     Comment:  bx=Adenocarcinoma,Gleason=4+3=7,PSA=5.5,volume  =36cc 03/02/2012-04/20/2012: Radiation     Comment:  prostate bed 6600 cGy No date: Stress incontinence, male  Reproductive/Obstetrics negative OB ROS                            Anesthesia Physical  Anesthesia Plan  ASA: 3  Anesthesia Plan: General   Post-op Pain Management:    Induction: Intravenous  PONV Risk Score and Plan: 1 and  TIVA  Airway Management Planned: Natural Airway and Nasal Cannula  Additional Equipment:   Intra-op Plan:   Post-operative Plan:   Informed Consent: I have reviewed the patients History and Physical, chart, labs and discussed the procedure including the risks, benefits and alternatives for the proposed anesthesia with the patient or authorized representative who has indicated his/her understanding and acceptance.     Dental advisory given  Plan Discussed with: CRNA, Surgeon and Anesthesiologist  Anesthesia Plan Comments:        Anesthesia Quick Evaluation

## 2020-12-05 ENCOUNTER — Encounter: Payer: Self-pay | Admitting: Gastroenterology

## 2020-12-05 LAB — SURGICAL PATHOLOGY

## 2021-05-05 ENCOUNTER — Other Ambulatory Visit: Payer: Self-pay

## 2021-05-05 ENCOUNTER — Ambulatory Visit: Payer: Managed Care, Other (non HMO) | Admitting: Internal Medicine

## 2021-05-05 ENCOUNTER — Encounter: Payer: Self-pay | Admitting: Internal Medicine

## 2021-05-05 VITALS — BP 132/78 | HR 78 | Ht 66.0 in | Wt 245.8 lb

## 2021-05-05 DIAGNOSIS — I10 Essential (primary) hypertension: Secondary | ICD-10-CM | POA: Diagnosis not present

## 2021-05-05 DIAGNOSIS — C61 Malignant neoplasm of prostate: Secondary | ICD-10-CM | POA: Diagnosis not present

## 2021-05-05 DIAGNOSIS — Z6839 Body mass index (BMI) 39.0-39.9, adult: Secondary | ICD-10-CM | POA: Diagnosis not present

## 2021-05-05 DIAGNOSIS — R7303 Prediabetes: Secondary | ICD-10-CM

## 2021-05-05 NOTE — Progress Notes (Signed)
? ? ?Date:  05/05/2021  ? ?Name:  Karl Gentry   DOB:  07/16/1950   MRN:  [id number] ? ? ?Chief Complaint: Hypertension ? ?Hypertension ?This is a chronic problem. The problem is controlled. Pertinent negatives include no chest pain, headaches, palpitations or shortness of breath. Past treatments include angiotensin blockers. The current treatment provides significant improvement.  ?Diabetes ?He presents for his follow-up diabetic visit. Diabetes type: prediabetes. His disease course has been stable. Pertinent negatives for hypoglycemia include no dizziness or headaches. Pertinent negatives for diabetes include no chest pain, no fatigue and no weakness. Current diabetic treatment includes diet.  ? ?Lab Results  ?Component Value Date  ? NA 140 11/05/2020  ? K 4.7 11/05/2020  ? CO2 23 11/05/2020  ? GLUCOSE 113 (H) 11/05/2020  ? BUN 18 11/05/2020  ? CREATININE 1.00 11/05/2020  ? CALCIUM 10.0 11/05/2020  ? EGFR 81 11/05/2020  ? GFRNONAA 93 11/03/2019  ? ?Lab Results  ?Component Value Date  ? CHOL 189 11/05/2020  ? HDL 47 11/05/2020  ? LDLCALC 122 (H) 11/05/2020  ? TRIG 113 11/05/2020  ? CHOLHDL 4.0 11/05/2020  ? ?Lab Results  ?Component Value Date  ? TSH 2.390 06/21/2018  ? ?Lab Results  ?Component Value Date  ? HGBA1C 6.2 (H) 11/05/2020  ? ?Lab Results  ?Component Value Date  ? WBC 6.0 11/05/2020  ? HGB 14.4 11/05/2020  ? HCT 44.3 11/05/2020  ? MCV 89 11/05/2020  ? PLT 196 11/05/2020  ? ?Lab Results  ?Component Value Date  ? ALT 26 11/05/2020  ? AST 22 11/05/2020  ? ALKPHOS 73 11/05/2020  ? BILITOT 0.6 11/05/2020  ? ?No results found for: 25OHVITD2, [place], VD25OH  ? ?Review of Systems  ?Constitutional:  Negative for fatigue and unexpected weight change.  ?HENT:  Negative for nosebleeds.   ?Eyes:  Negative for visual disturbance.  ?Respiratory:  Negative for cough, chest tightness, shortness of breath and wheezing.   ?Cardiovascular:  Negative for chest pain, palpitations and leg swelling.  ?Gastrointestinal:   Negative for abdominal pain, constipation and diarrhea.  ?Neurological:  Negative for dizziness, weakness, light-headedness and headaches.  ? ?Patient Active Problem List  ? Diagnosis Date Noted  ? Encounter for screening colonoscopy   ? Polyp of descending colon   ? Polyp of colon   ? Prediabetes 11/06/2020  ? BMI 40.0-44.9, adult ([place]) 11/03/2019  ? S/P repair of ventral hernia 04/06/2019  ? Essential hypertension 06/21/2018  ? Prostate cancer ([place]) 04/23/2011  ? ? ?Allergies  ?Allergen Reactions  ? Molds & Smuts   ?  Mildew and cats also cause irritated eyes, itching, running nose  ? ? ?Past Surgical History:  ?Procedure Laterality Date  ? ABDOMINAL WALL DEFECT REPAIR N/A 04/06/2019  ? Procedure: REPAIR ABDOMINAL WALL;  Surgeon: Jules Husbands, MD;  Location: [facility] ORS;  Service: General;  Laterality: N/A;  ? APPENDECTOMY  04/06/2019  ? Procedure: APPENDECTOMY;  Surgeon: Jules Husbands, MD;  Location: [facility] ORS;  Service: General;;  ? BIOPSY PROSTATE  2013  ? bx. 2'13 [place]  ? COLONOSCOPY WITH PROPOFOL N/A 12/04/2020  ? Procedure: COLONOSCOPY WITH PROPOFOL;  Surgeon: Lin Landsman, MD;  Location: [facility] ENDOSCOPY;  Service: Gastroenterology;  Laterality: N/A;  ? HERNIA REPAIR Right 1977  ?  -open rt. right inguinal hernia repair/mesh  ? INGUINAL HERNIA REPAIR Bilateral 04/06/2019  ? Procedure: HERNIA REPAIR INGUINAL ADULT BILATERAL;  Surgeon: Jules Husbands, MD;  Location: [facility] ORS;  Service: General;  Laterality: Bilateral;  ? ROBOT ASSISTED LAPAROSCOPIC RADICAL PROSTATECTOMY  04/23/2011  ? Procedure: ROBOTIC ASSISTED LAPAROSCOPIC RADICAL PROSTATECTOMY LEVEL 2;  Surgeon: Dutch Gray, MD;  Location: [facility] ORS;  Service: Urology;  Laterality: N/A;    ? VENTRAL HERNIA REPAIR N/A 04/06/2019  ? Procedure: HERNIA REPAIR VENTRAL ADULT;  Surgeon: Jules Husbands, MD;  Location: [facility] ORS;  Service: General;  Laterality: N/A;  ? ? ?Social History  ? ?Tobacco Use  ? Smoking status: Former  ?  Types: Pipe  ?   Quit date: 1970  ?  Years since quitting: 53.2  ? Smokeless tobacco: Former  ?  Types: Chew  ?Vaping Use  ? Vaping Use: Never used  ?Substance Use Topics  ? Alcohol use: No  ? Drug use: No  ? ? ? ?Medication list has been reviewed and updated. ? ?Current Meds  ?Medication Sig  ? olmesartan (BENICAR) 20 MG tablet Take 1 tablet (20 mg total) by mouth daily.  ? ? ? ?  05/05/2021  ?  9:02 AM 11/05/2020  ?  8:46 AM 06/02/2019  ?  1:50 PM 05/03/2019  ? 10:01 AM  ?PHQ 2/9 Scores  ?PHQ - 2 Score 0 0 0 0  ?PHQ- 9 Score 0 0 0 0  ? ? ? ?  05/05/2021  ?  9:03 AM 11/05/2020  ?  8:46 AM 06/02/2019  ?  1:50 PM  ?GAD 7 : Generalized Anxiety Score  ?Nervous, Anxious, on Edge 0 0 0  ?Control/stop worrying 0 0 0  ?Worry too much - different things 0 0 0  ?Trouble relaxing 0 0 0  ?Restless 0 0 0  ?Easily annoyed or irritable 0 0 0  ?Afraid - awful might happen 0 0 0  ?Total GAD 7 Score 0 0 0  ?Anxiety Difficulty Not difficult at all Not difficult at all Not difficult at all  ? ? ?BP Readings from Last 3 Encounters:  ?05/05/21 132/78  ?12/04/20 128/67  ?11/05/20 138/82  ? ? ?Physical Exam ?Vitals and nursing note reviewed.  ?Constitutional:   ?   General: He is not in acute distress. ?   Appearance: He is well-developed.  ?HENT:  ?   Head: Normocephalic and atraumatic.  ?Pulmonary:  ?   Effort: Pulmonary effort is normal. No respiratory distress.  ?Skin: ?   General: Skin is warm and dry.  ?   Findings: No rash.  ?Neurological:  ?   Mental Status: He is alert and oriented to person, place, and time.  ?Psychiatric:     ?   Mood and Affect: Mood normal.     ?   Behavior: Behavior normal.  ? ? ?Wt Readings from Last 3 Encounters:  ?05/05/21 245 lb 12.8 oz (111.5 kg)  ?12/04/20 260 lb (117.9 kg)  ?11/05/20 255 lb (115.7 kg)  ? ? ?BP 132/78   Pulse 78   Ht _0  (1.676 m)   Wt 245 lb 12.8 oz (111.5 kg)   SpO2 97%   BMI 39.67 kg/m?  ? ?Assessment and Plan: ?1. Essential hypertension ?Clinically stable exam with well controlled BP. ?Tolerating  medications without side effects at this time. ?Pt to continue current regimen and low sodium diet; benefits of regular exercise as able discussed. ?- Basic metabolic panel ? ?2. Prediabetes ?Continue to work on diet and exercise ?He has lost 10 lbs ?- Hemoglobin A1c ? ?3. Prostate cancer ([place]) ?Followed by Urology ? ?4. BMI 39.0-39.9,adult ?Continue diet and exercise ? ? ?Partially  dictated using Editor, commissioning. Any errors are unintentional. ? ?Halina Maidens, MD ?[facility] ?[facility] ? ?05/05/2021 ? ? ? ? ? ?

## 2021-05-06 LAB — HEMOGLOBIN A1C
Est. average glucose Bld gHb Est-mCnc: 123 mg/dL
Hgb A1c MFr Bld: 5.9 % — ABNORMAL HIGH (ref 4.8–5.6)

## 2021-05-06 LAB — BASIC METABOLIC PANEL WITH GFR
BUN/Creatinine Ratio: 18 (ref 10–24)
BUN: 17 mg/dL (ref 8–27)
CO2: 24 mmol/L (ref 20–29)
Calcium: 9.7 mg/dL (ref 8.6–10.2)
Chloride: 99 mmol/L (ref 96–106)
Creatinine, Ser: 0.97 mg/dL (ref 0.76–1.27)
Glucose: 106 mg/dL — ABNORMAL HIGH (ref 70–99)
Potassium: 4.9 mmol/L (ref 3.5–5.2)
Sodium: 136 mmol/L (ref 134–144)
eGFR: 85 mL/min/1.73

## 2021-05-06 NOTE — Progress Notes (Signed)
PC to PT, discussed labs, pt voiced understanding.-KH ?

## 2021-05-07 ENCOUNTER — Other Ambulatory Visit: Payer: Self-pay | Admitting: Internal Medicine

## 2021-05-07 DIAGNOSIS — I10 Essential (primary) hypertension: Secondary | ICD-10-CM

## 2021-05-08 NOTE — Telephone Encounter (Signed)
Requested Prescriptions  ?Pending Prescriptions Disp Refills  ?? olmesartan (BENICAR) 20 MG tablet [Pharmacy Med Name: OLMESARTAN MEDOXOMIL 20 MG TAB] 90 tablet 1  ?  Sig: TAKE 1 TABLET BY MOUTH EVERY DAY  ?  ? Cardiovascular:  Angiotensin Receptor Blockers Passed - 05/07/2021  1:46 AM  ?  ?  Passed - Cr in normal range and within 180 days  ?  Creatinine, Ser  ?Date Value Ref Range Status  ?05/05/2021 0.97 0.76 - 1.27 mg/dL Final  ?   ?  ?  Passed - K in normal range and within 180 days  ?  Potassium  ?Date Value Ref Range Status  ?05/05/2021 4.9 3.5 - 5.2 mmol/L Final  ?   ?  ?  Passed - Patient is not pregnant  ?  ?  Passed - Last BP in normal range  ?  BP Readings from Last 1 Encounters:  ?05/05/21 132/78  ?   ?  ?  Passed - Valid encounter within last 6 months  ?  Recent Outpatient Visits   ?      ? 3 days ago Essential hypertension  ? [facility] Glean Hess, MD  ? 6 months ago Annual physical exam  ? [facility] Glean Hess, MD  ? 1 year ago Annual physical exam  ? [facility] Glean Hess, MD  ? 1 year ago Pneumonia due to COVID-19 virus  ? [facility] Glean Hess, MD  ? 2 years ago Essential hypertension  ? [facility] Glean Hess, MD  ?  ?  ?Future Appointments   ?        ? In 6 months [facility] Melia Jesse Sans, MD [facility] [place]  ?  ? ?  ?  ?  ? ?

## 2021-09-20 IMAGING — CT CT ABD-PELV W/ CM
2 of 5 series · 13 of 46 positions shown, 15 images · IV contrast (APPLIED)
Comparison: Abdomen/pelvis CT 01/09/2019

CLINICAL DATA: Postoperative day 2 from bilateral inguinal hernia
repair, appendectomy, lysis of adhesions and abdominal wall
reconstruction including abdominal wall reconstruction with
bilateral myocutaneous flaps.

EXAM:
CT ANGIOGRAPHY CHEST
CT ABDOMEN AND PELVIS WITH CONTRAST
TECHNIQUE: Multidetector CT imaging of the chest was performed using the
standard protocol during bolus administration of intravenous
contrast. Multiplanar CT image reconstructions and MIPs were
obtained to evaluate the vascular anatomy. Multidetector CT imaging
of the abdomen and pelvis was performed using the standard protocol
during bolus administration of intravenous contrast.
CONTRAST:  100mL OMNIPAQUE IOHEXOL 350 MG/ML SOLN

[Series 4: axial st · axial · 0.96mm/px · z∈[-1030,-536]mm · 10 of 111 slices shown, 12 images]
[im 6/111  soft-tissue]
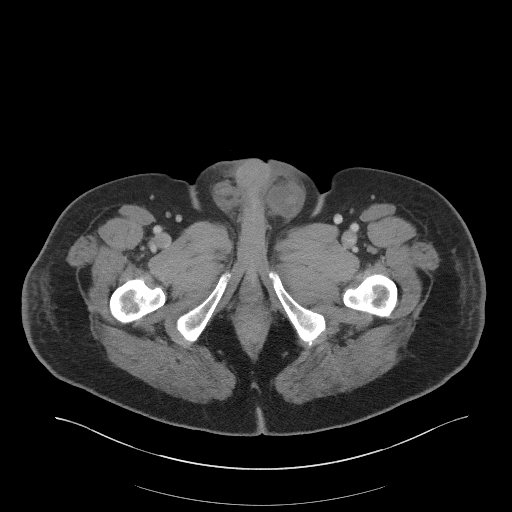
[im 6/111  bone]
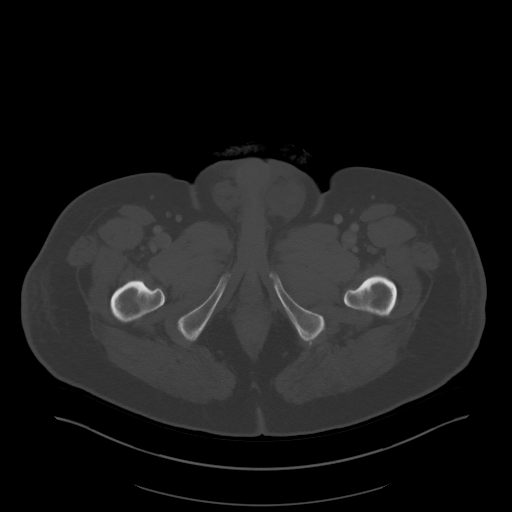
[im 17/111  soft-tissue]
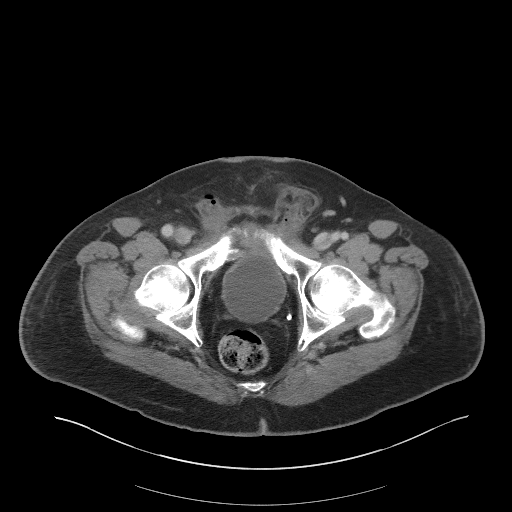
[im 28/111  soft-tissue]
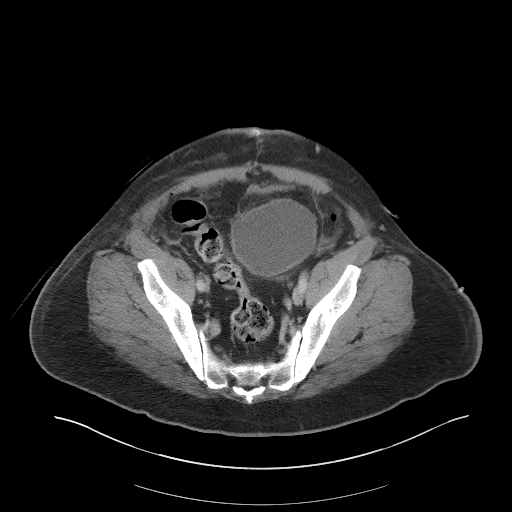
[im 39/111  soft-tissue]
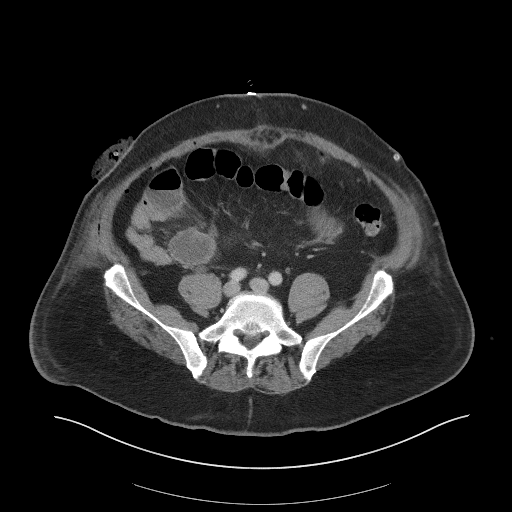
[im 50/111  soft-tissue]
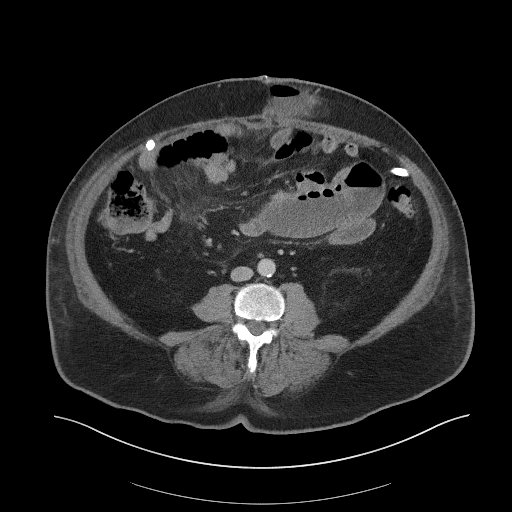
[im 61/111  soft-tissue]
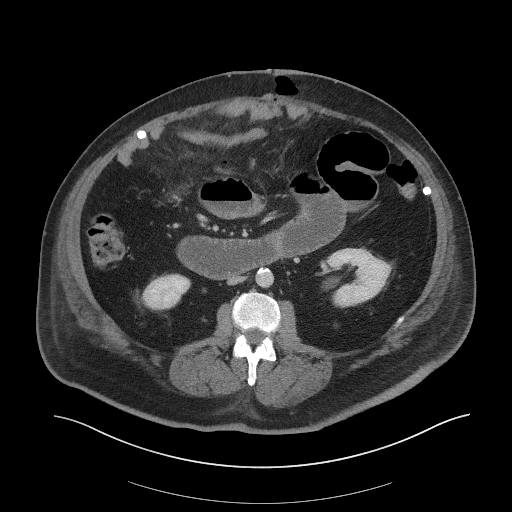
[im 72/111  soft-tissue]
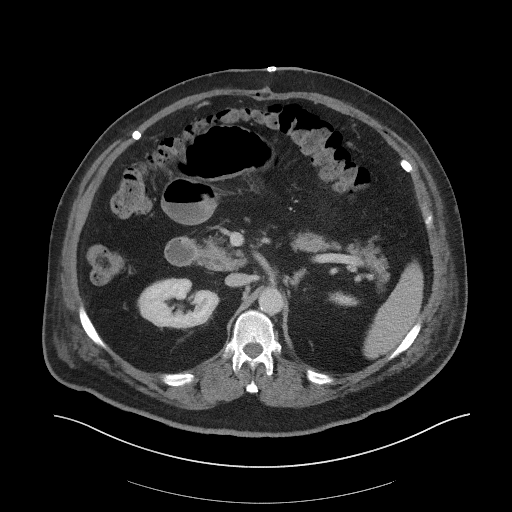
[im 83/111  soft-tissue]
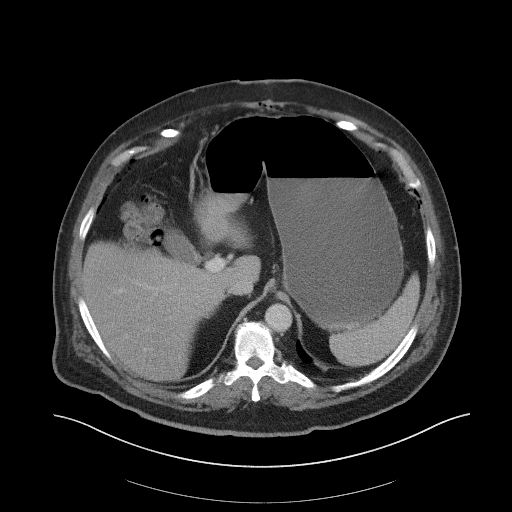
[im 94/111  soft-tissue]
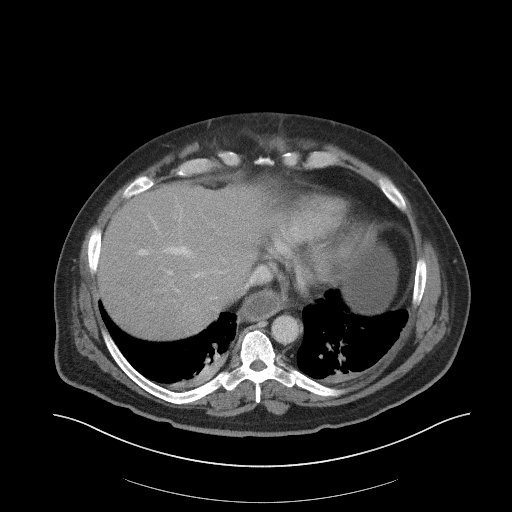
[im 94/111  bone]
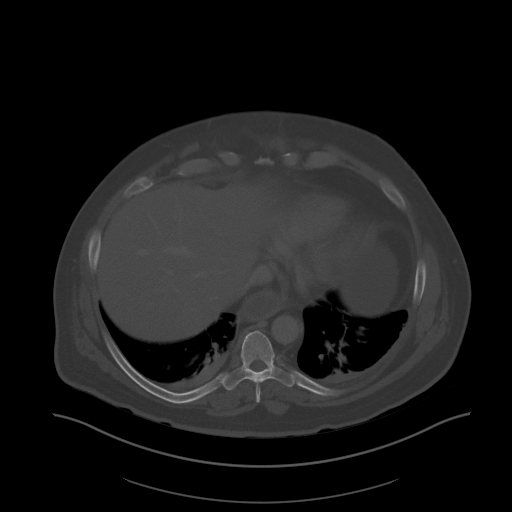
[im 105/111  soft-tissue]
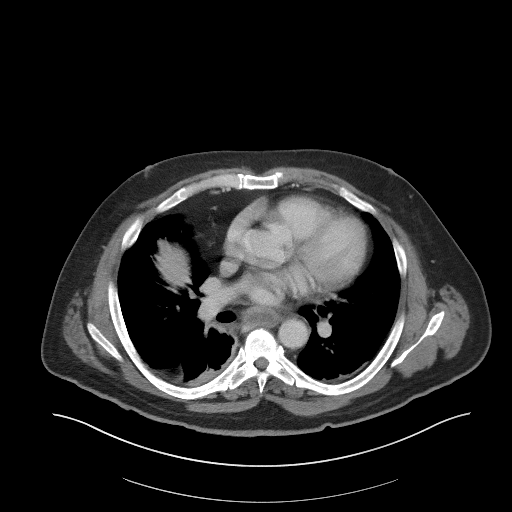

[Series 7: coronal st · coronal · 0.90mm/px · 3 of 111 slices shown]
[im 37/111  soft-tissue]
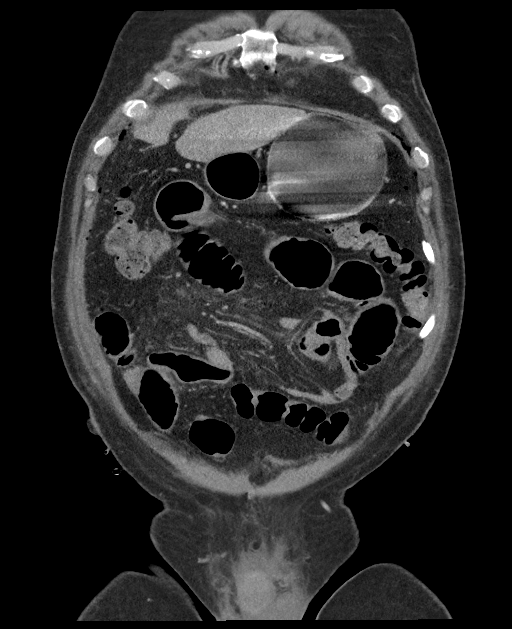
[im 49/111  soft-tissue]
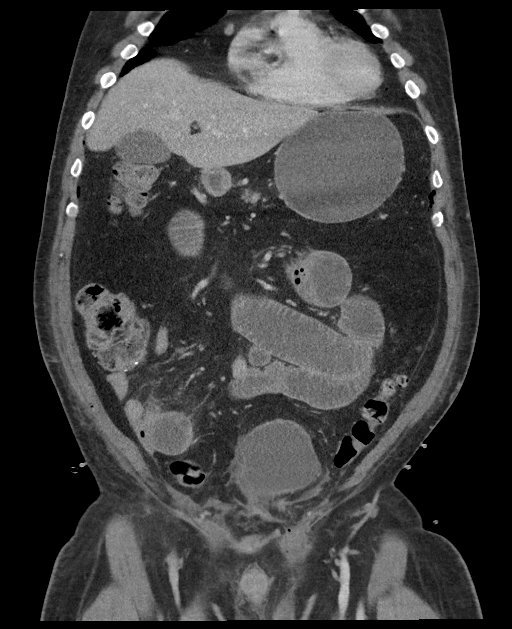
[im 62/111  soft-tissue]
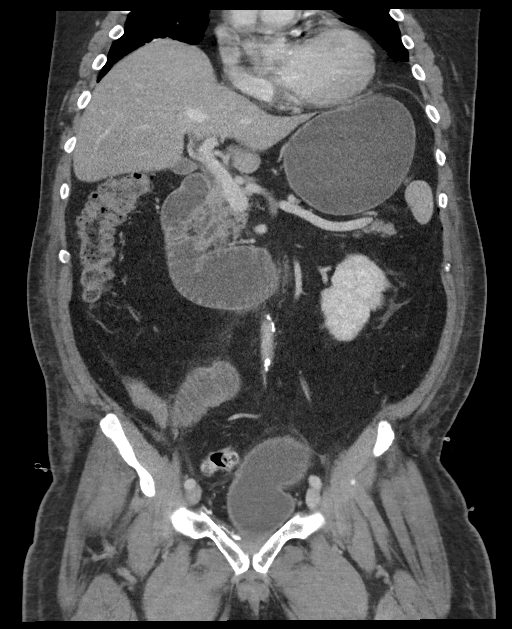

[13 of 46 positions shown; findings below may reference images not displayed]

FINDINGS: CTA CHEST FINDINGS

Cardiovascular: Heart size upper normal. Coronary artery
calcification is evident. Atherosclerotic calcification is noted in
the wall of the thoracic aorta. Bovine arch vessel anatomy noted, a
normal variant. There is no filling defect in the opacified
pulmonary arteries to suggest the presence of an acute pulmonary
embolus.

Mediastinum/Nodes: No mediastinal lymphadenopathy. Borderline
enlarged 10 mm short axis right pericardial node visible on image
5/series 4. There is no hilar lymphadenopathy. Esophagus is filled
with fluid, likely reflux given the appearance of the stomach. There
is no axillary lymphadenopathy.

Lungs/Pleura: Dependent atelectasis noted in both lower lobes. No
suspicious focal airspace disease to suggest pneumonia. No pulmonary
edema. No pleural effusion.

Musculoskeletal: No worrisome lytic or sclerotic osseous
abnormality.

Review of the MIP images confirms the above findings.

CT ABDOMEN and PELVIS FINDINGS

Hepatobiliary: No suspicious focal abnormality within the liver
parenchyma. There is no evidence for gallstones, gallbladder wall
thickening, or pericholecystic fluid. No intrahepatic or
extrahepatic biliary dilation.

Pancreas: No focal mass lesion. No dilatation of the main duct. No
intraparenchymal cyst. No peripancreatic edema.

Spleen: No splenomegaly. No focal mass lesion.

Adrenals/Urinary Tract: No adrenal nodule or mass. Kidneys
unremarkable No evidence for hydroureter. Bladder is moderately
distended. Distortion of normal bladder shape likely related to
history of bladder extending into the left inguinal hernia as seen
on 01/09/2019.

Stomach/Bowel: Stomach is prominently distended with fluid and gas.
Duodenum is fluid-filled and distended. Proximal jejunal loops are
fluid-filled and distended up to 4.2 cm diameter gradual tapering of
jejunal loops and decompressed mid and distal small bowel noted. The
distal and terminal ileum is completely decompressed.
Nonvisualization of the appendix is consistent with the reported
history of appendectomy. No gross colonic mass. No colonic wall
thickening.

Vascular/Lymphatic: There is abdominal aortic atherosclerosis
without aneurysm. There is no gastrohepatic or hepatoduodenal
ligament lymphadenopathy. No retroperitoneal or mesenteric
lymphadenopathy. No pelvic sidewall lymphadenopathy.

Reproductive: Prostatectomy.

Other: No intraperitoneal free fluid. Bilateral surgical drains
evident with right-sided anterior abdominal drain tracking in or
just distal to the rectus sheath. Left-sided drain appears to be in
the preperitoneal space or potentially in the rectus sheath.

Gas is identified in the anterior abdomen some of which may be
intraperitoneal but most of it appears to be preperitoneal or within
the rectus sheath. This would not be unexpected 2 days after
surgery.

Musculoskeletal: The large ventral hernia seen previously has been
repaired in the interval. 4.2 x 4.1 x 3.2 cm collection of fluid and
gas is identified in the midline subcutaneous fat at the repair site
compatible with recent surgery and probably a small residual seroma
or potentially hematoma.

Gas and fluid in the right inguinal region is compatible with the
recent herniorrhaphy. Similar fluid and gas noted in the left
inguinal canal also compatible with recent herniorrhaphy.

No worrisome lytic or sclerotic osseous abnormality.

Review of the MIP images confirms the above findings.
IMPRESSION: 1. No CT evidence for acute pulmonary embolus.
2. Dependent atelectasis in the lower lobes without edema or pleural
effusion.
3. Upper normal to borderline right pericardial lymph node with no
other lymphadenopathy in the abdomen. Follow-up CT chest without
contrast in 3 months suggested to ensure that this remains stable.
4. Fluid-filled distended stomach and proximal small bowel with
gradual tapering of jejunal loops and decompressed mid and distal
small bowel. Fluid reflux noted into the esophagus to a level above
the carina. Given that the patient is only 2 days out from surgery
and there is no abrupt small bowel transition zone, imaging features
are most compatible with adynamic ileus.
5. 4.2 x 4.1 x 3.2 cm collection of fluid and gas in the midline
subcutaneous fat at the repair site compatible with recent surgery
and probably represents a small residual seroma or hematoma.
6. Tiny volume gas and fluid in the bilateral inguinal canals
compatible with recent herniorrhaphy.
7. Small amount of gas in the anterior abdomen, some of which may be
intraperitoneal but most of it appears to be preperitoneal or within
the rectus sheath. This would not be unexpected 2 days after
surgery.
8. Prostatectomy.
9. Aortic Atherosclerosis ([id number]).

## 2021-09-20 IMAGING — DX DG CHEST 1V PORT
1 series · 2 of 2 positions shown · non-contrast
Comparison: 09/30/2014

CLINICAL DATA: Shortness of breath, history of prostate cancer

EXAM:
PORTABLE CHEST 1 VIEW

[Series 1: chest ap · 0.14mm/px · 2 of 2 slices shown]
[im 1/2]
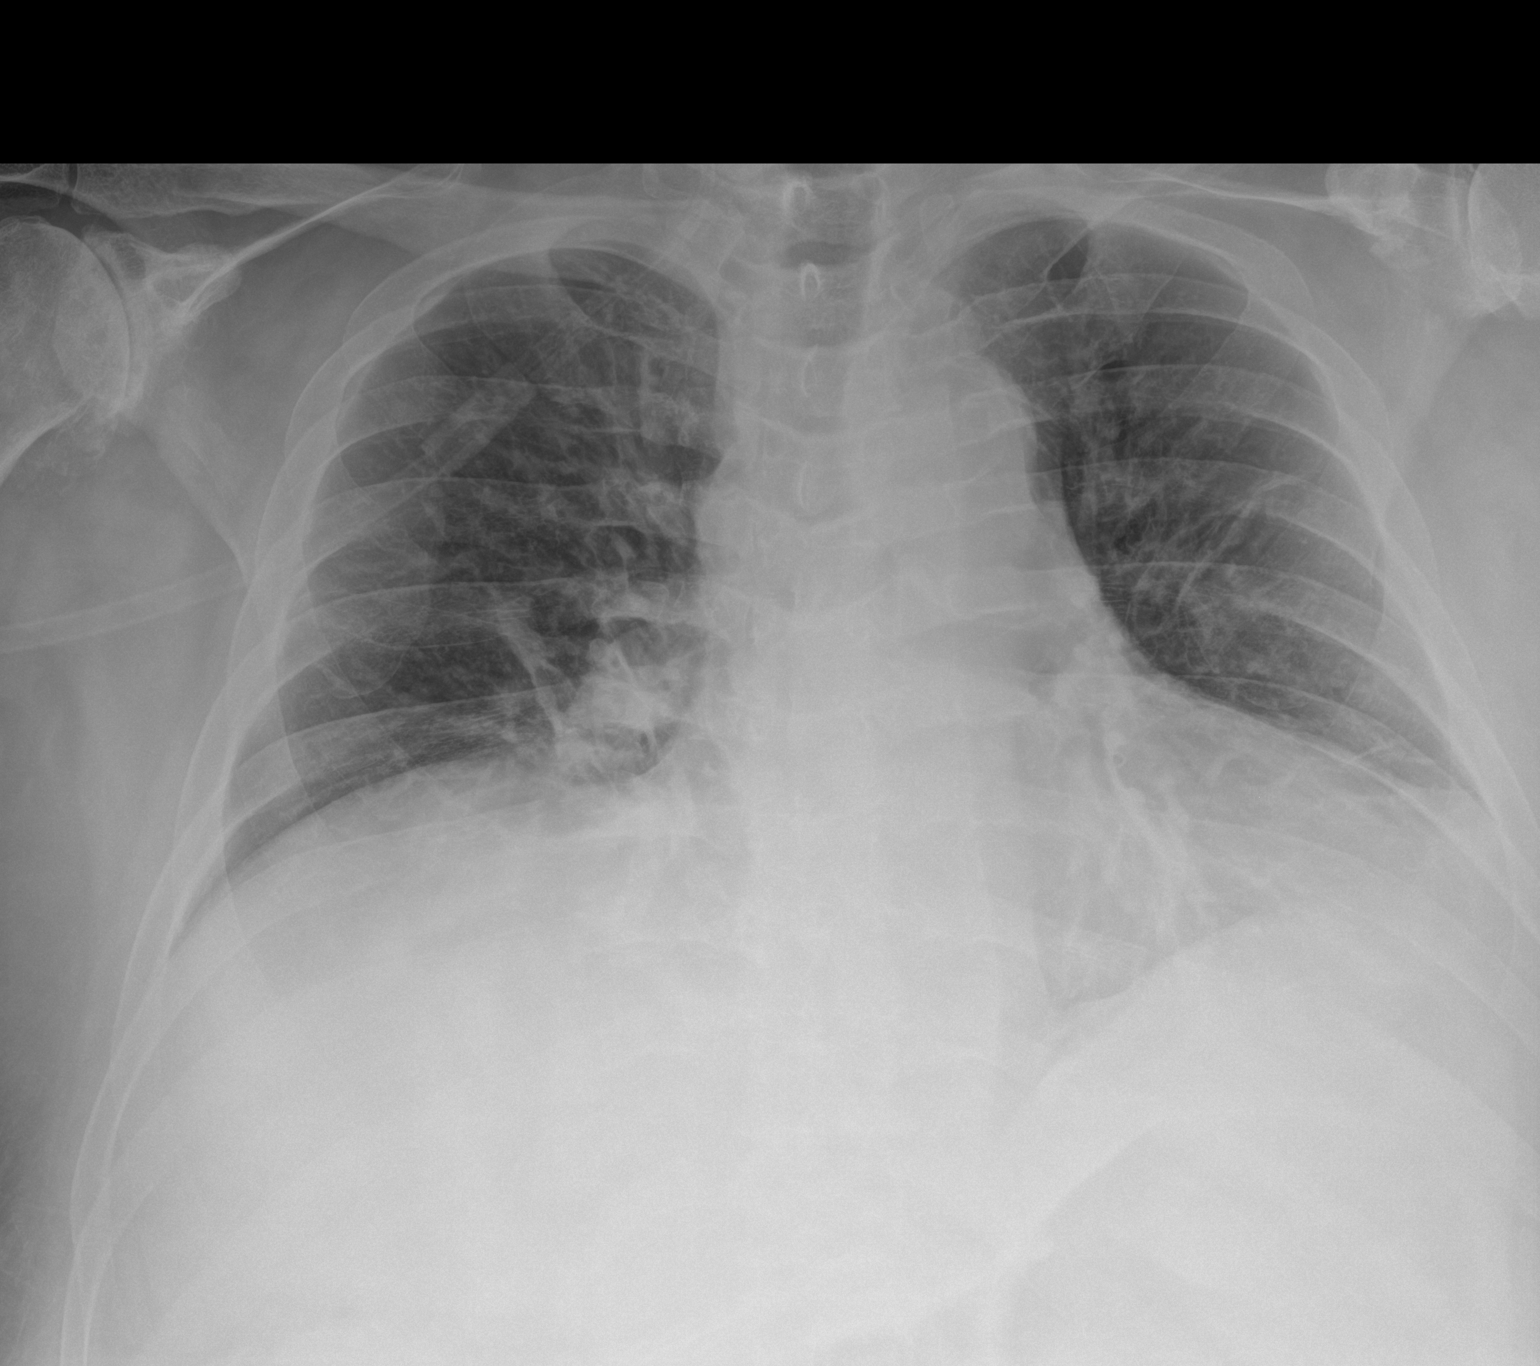
[im 2/2]
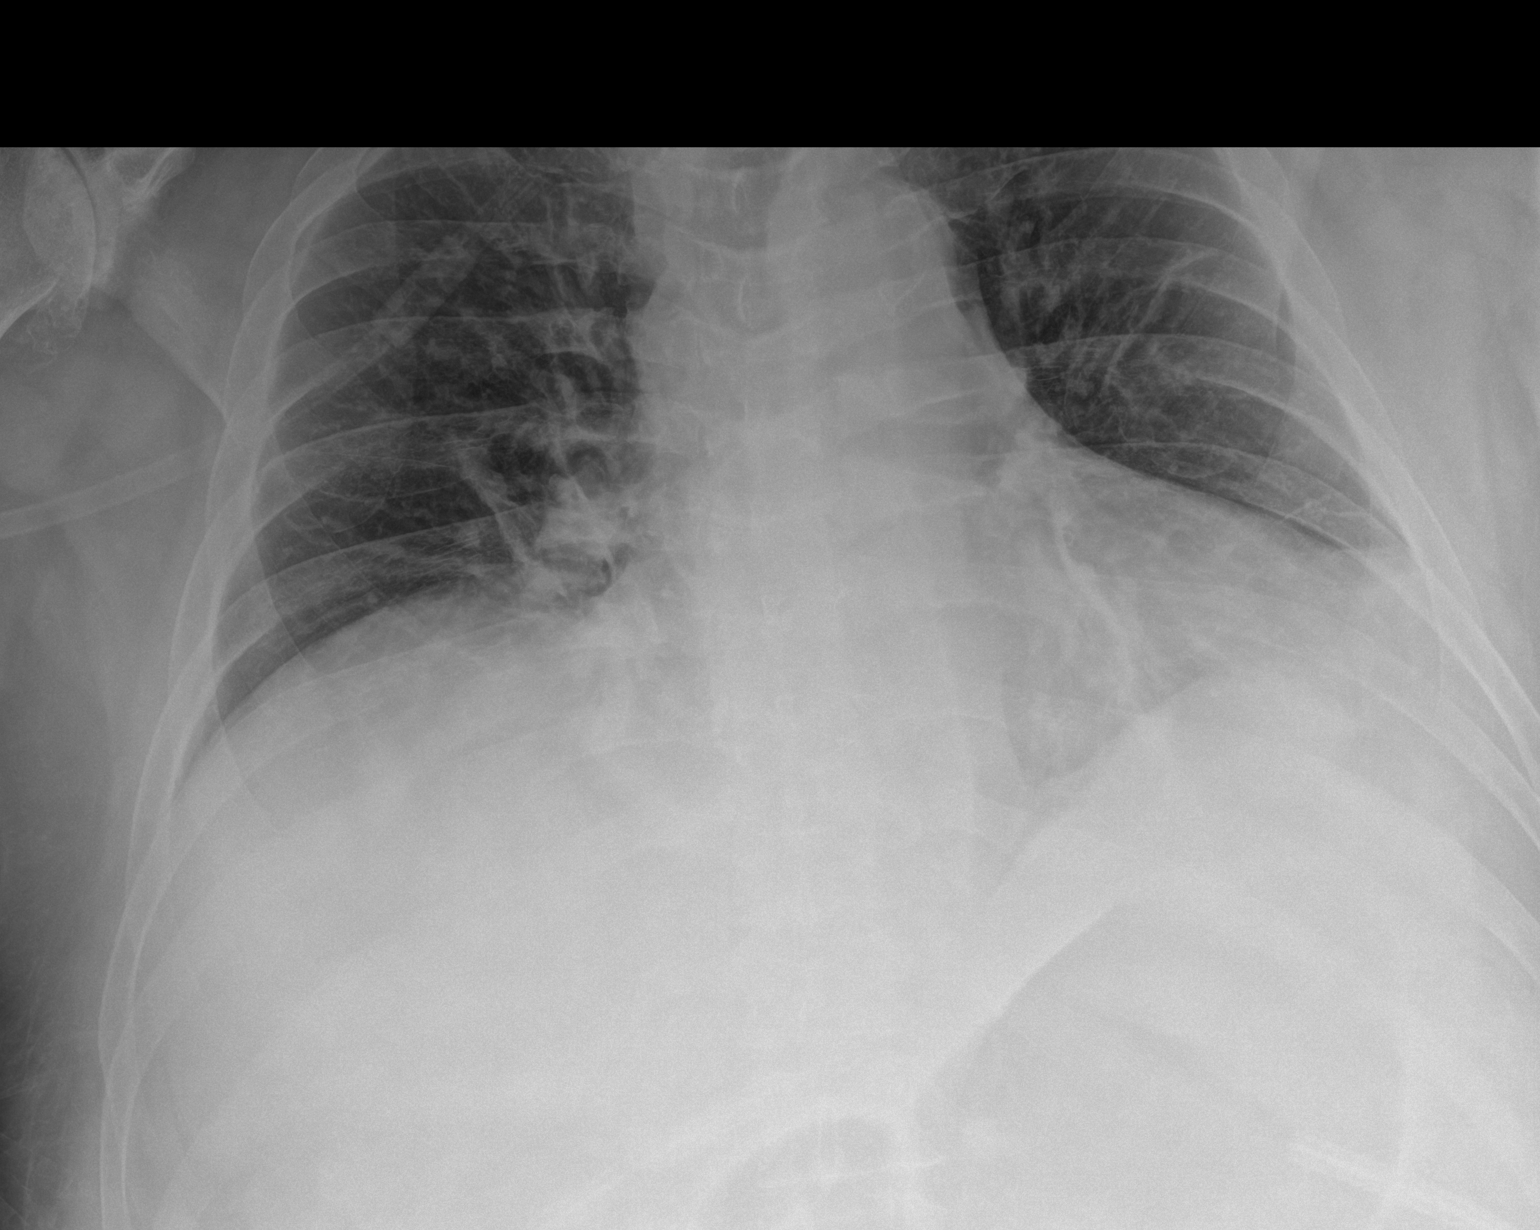

[2 of 2 positions shown; findings below may reference images not displayed]

FINDINGS: 2 frontal views of the chest demonstrate an unremarkable cardiac
silhouette. Lung volumes are diminished with scattered areas of
atelectasis. No airspace disease, effusion, or pneumothorax. No
acute bony abnormalities. Bilateral shoulder osteoarthritis.
IMPRESSION: 1. Low lung volumes with scattered atelectasis. No acute airspace
disease.

## 2021-10-31 ENCOUNTER — Other Ambulatory Visit: Payer: Self-pay | Admitting: Internal Medicine

## 2021-10-31 DIAGNOSIS — I10 Essential (primary) hypertension: Secondary | ICD-10-CM

## 2021-10-31 NOTE — Telephone Encounter (Signed)
Requested Prescriptions  Pending Prescriptions Disp Refills  . olmesartan (BENICAR) 20 MG tablet [Pharmacy Med Name: OLMESARTAN MEDOXOMIL 20 MG TAB] 90 tablet 1    Sig: TAKE 1 TABLET BY MOUTH EVERY DAY     Cardiovascular:  Angiotensin Receptor Blockers Passed - 10/31/2021  2:12 AM      Passed - Cr in normal range and within 180 days    Creatinine, Ser  Date Value Ref Range Status  05/05/2021 0.97 0.76 - 1.27 mg/dL Final         Passed - K in normal range and within 180 days    Potassium  Date Value Ref Range Status  05/05/2021 4.9 3.5 - 5.2 mmol/L Final         Passed - Patient is not pregnant      Passed - Last BP in normal range    BP Readings from Last 1 Encounters:  05/05/21 132/78         Passed - Valid encounter within last 6 months    Recent Outpatient Visits          5 months ago Essential hypertension   [facility], Jesse Sans, MD   12 months ago Annual physical exam   [facility] at [facility], Jesse Sans, MD   1 year ago Annual physical exam   [facility] at [facility], Jesse Sans, MD   2 years ago Pneumonia due to COVID-19 virus   [facility] at [facility], Jesse Sans, MD   2 years ago Essential hypertension   [place] [facility] at [facility], Jesse Sans, MD      Future Appointments            In 1 week Army Melia, Jesse Sans, MD [facility] at [place], [facility]

## 2021-11-07 ENCOUNTER — Encounter: Payer: Self-pay | Admitting: Internal Medicine

## 2021-11-07 ENCOUNTER — Ambulatory Visit (INDEPENDENT_AMBULATORY_CARE_PROVIDER_SITE_OTHER): Payer: Managed Care, Other (non HMO) | Admitting: Internal Medicine

## 2021-11-07 VITALS — BP 128/77 | HR 77 | Ht 66.0 in | Wt 258.0 lb

## 2021-11-07 DIAGNOSIS — Z0000 Encounter for general adult medical examination without abnormal findings: Secondary | ICD-10-CM | POA: Diagnosis not present

## 2021-11-07 DIAGNOSIS — I10 Essential (primary) hypertension: Secondary | ICD-10-CM | POA: Diagnosis not present

## 2021-11-07 DIAGNOSIS — Z23 Encounter for immunization: Secondary | ICD-10-CM | POA: Diagnosis not present

## 2021-11-07 DIAGNOSIS — Z8546 Personal history of malignant neoplasm of prostate: Secondary | ICD-10-CM

## 2021-11-07 DIAGNOSIS — R7303 Prediabetes: Secondary | ICD-10-CM

## 2021-11-07 DIAGNOSIS — Z6841 Body Mass Index (BMI) 40.0 and over, adult: Secondary | ICD-10-CM

## 2021-11-07 DIAGNOSIS — E785 Hyperlipidemia, unspecified: Secondary | ICD-10-CM | POA: Insufficient documentation

## 2021-11-07 NOTE — Progress Notes (Signed)
Date:  11/07/2021   Name:  Karl Gentry   DOB:  [birth year]   MRN:  [id number]   Chief Complaint: Annual Exam Karl Gentry is a 71 y.o. male who presents today for his Complete Annual Exam. He feels well. He reports exercising walking now. He reports he is sleeping poorly due to bilateral shoulder pain.  Colonoscopy: 11/2020  Immunization History  Administered Date(s) Administered   Fluad Quad(high Dose 65+) 11/03/2019, 11/05/2020   PFIZER(Purple Top)SARS-COV-2 Vaccination 06/01/2019, 06/27/2019   Pneumococcal Conjugate-13 06/21/2018   Pneumococcal Polysaccharide-23 11/03/2019   Health Maintenance Due  Topic Date Due   TETANUS/TDAP  Never done   Zoster Vaccines- Shingrix (1 of 2) Never done   INFLUENZA VACCINE  09/09/2021    No results found for: "PSA1", "PSA"   Hypertension This is a chronic problem. The problem is controlled. Pertinent negatives include no chest pain, headaches, palpitations or shortness of breath. Past treatments include angiotensin blockers. The current treatment provides significant improvement.    Lab Results  Component Value Date   NA 136 05/05/2021   K 4.9 05/05/2021   CO2 24 05/05/2021   GLUCOSE 106 (H) 05/05/2021   BUN 17 05/05/2021   CREATININE 0.97 05/05/2021   CALCIUM 9.7 05/05/2021   EGFR 85 05/05/2021   GFRNONAA 93 11/03/2019   Lab Results  Component Value Date   CHOL 189 11/05/2020   HDL 47 11/05/2020   LDLCALC 122 (H) 11/05/2020   TRIG 113 11/05/2020   CHOLHDL 4.0 11/05/2020   Lab Results  Component Value Date   TSH 2.390 06/21/2018   Lab Results  Component Value Date   HGBA1C 5.9 (H) 05/05/2021   Lab Results  Component Value Date   WBC 6.0 11/05/2020   HGB 14.4 11/05/2020   HCT 44.3 11/05/2020   MCV 89 11/05/2020   PLT 196 11/05/2020   Lab Results  Component Value Date   ALT 26 11/05/2020   AST 22 11/05/2020   ALKPHOS 73 11/05/2020   BILITOT 0.6 11/05/2020   No results found for: "25OHVITD2",  "25OHVITD3", "VD25OH"   Review of Systems  Constitutional:  Negative for appetite change, chills, diaphoresis, fatigue and unexpected weight change.  HENT:  Negative for hearing loss, tinnitus, trouble swallowing and voice change.   Eyes:  Negative for visual disturbance.  Respiratory:  Negative for choking, shortness of breath and wheezing.   Cardiovascular:  Positive for leg swelling (intermittently). Negative for chest pain and palpitations.  Gastrointestinal:  Negative for abdominal pain, blood in stool, constipation and diarrhea.  Genitourinary:  Negative for difficulty urinating, dysuria and frequency.  Musculoskeletal:  Positive for arthralgias (bilateral shoulder pain). Negative for back pain and myalgias.  Skin:  Negative for color change and rash.  Neurological:  Negative for dizziness, syncope and headaches.  Hematological:  Negative for adenopathy.  Psychiatric/Behavioral:  Positive for sleep disturbance. Negative for dysphoric mood. The patient is not nervous/anxious.     Patient Active Problem List   Diagnosis Date Noted   Mild hyperlipidemia 11/07/2021   Polyp of descending colon    Polyp of colon    Prediabetes 11/06/2020   BMI 40.0-44.9, adult ([place]) 11/03/2019   S/P repair of ventral hernia 04/06/2019   Essential hypertension 06/21/2018   History of prostate cancer 04/23/2011    Allergies  Allergen Reactions   Molds & Smuts     Mildew and cats also cause irritated eyes, itching, running nose    Past Surgical History:  Procedure  Laterality Date   ABDOMINAL WALL DEFECT REPAIR N/A 04/06/2019   Procedure: REPAIR ABDOMINAL WALL;  Surgeon: Jules Husbands, MD;  Location: [facility];  Service: General;  Laterality: N/A;   APPENDECTOMY  04/06/2019   Procedure: APPENDECTOMY;  Surgeon: Jules Husbands, MD;  Location: [facility] OR[facility];  Service: General;;   BIOPSY PROSTATE  2013   bx. 2'13 [place] [facility]   COLONOSCOPY WITH PROPOFOL N/A 12/04/2020   Procedure: COLONOSCOPY  WITH PROPOFOL;  Surgeon: Lin Landsman, MD;  Location: [facility];  Service: Gastroenterology;  Laterality: N/A;   HERNIA REPAIR Right 1977    -open rt. right inguinal hernia repair/mesh   INGUINAL HERNIA REPAIR Bilateral 04/06/2019   Procedure: HERNIA REPAIR INGUINAL ADULT BILATERAL;  Surgeon: Jules Husbands, MD;  Location: [facility];  Service: General;  Laterality: Bilateral;   ROBOT ASSISTED LAPAROSCOPIC RADICAL PROSTATECTOMY  04/23/2011   Procedure: ROBOTIC ASSISTED LAPAROSCOPIC RADICAL PROSTATECTOMY LEVEL 2;  Surgeon: Dutch Gray, MD;  Location: [facility];  Service: Urology;  Laterality: N/A;     VENTRAL HERNIA REPAIR N/A 04/06/2019   Procedure: HERNIA REPAIR VENTRAL ADULT;  Surgeon: Jules Husbands, MD;  Location: [facility];  Service: General;  Laterality: N/A;    Social History   Tobacco Use   Smoking status: Former    Types: Pipe    Quit date: 1970    Years since quitting: 53.7   Smokeless tobacco: Former    Types: Nurse, children's Use: Never used  Substance Use Topics   Alcohol use: No   Drug use: No     Medication list has been reviewed and updated.  Current Meds  Medication Sig   olmesartan (BENICAR) 20 MG tablet TAKE 1 TABLET BY MOUTH EVERY DAY       11/07/2021    8:08 AM 05/05/2021    9:03 AM 11/05/2020    8:46 AM 06/02/2019    1:50 PM  GAD 7 : Generalized Anxiety Score  Nervous, Anxious, on Edge 0 0 0 0  Control/stop worrying 0 0 0 0  Worry too much - different things 0 0 0 0  Trouble relaxing 0 0 0 0  Restless 0 0 0 0  Easily annoyed or irritable 0 0 0 0  Afraid - awful might happen 0 0 0 0  Total GAD 7 Score 0 0 0 0  Anxiety Difficulty Not difficult at all Not difficult at all Not difficult at all Not difficult at all       11/07/2021    8:08 AM 05/05/2021    9:02 AM 11/05/2020    8:46 AM  Depression screen PHQ 2/9  Decreased Interest 0 0 0  Down, Depressed, Hopeless 0 0 0  PHQ - 2 Score 0 0 0  Altered sleeping 0 0 0  Tired, decreased  energy 0 0 0  Change in appetite 0 0 0  Feeling bad or failure about yourself  0 0 0  Trouble concentrating 0 0 0  Moving slowly or fidgety/restless 0 0 0  Suicidal thoughts 0 0 0  PHQ-9 Score 0 0 0  Difficult doing work/chores Not difficult at all Not difficult at all Not difficult at all    BP Readings from Last 3 Encounters:  11/07/21 134/76  05/05/21 132/78  12/04/20 128/67    Physical Exam Vitals and nursing note reviewed.  Constitutional:      Appearance: Normal appearance. He is well-developed.  HENT:     Head:  Normocephalic.     Right Ear: Tympanic membrane, ear canal and external ear normal.     Left Ear: Tympanic membrane, ear canal and external ear normal.     Nose: Nose normal.  Eyes:     Conjunctiva/sclera: Conjunctivae normal.     Pupils: Pupils are equal, round, and reactive to light.  Neck:     Thyroid: No thyromegaly.     Vascular: No carotid bruit.  Cardiovascular:     Rate and Rhythm: Normal rate and regular rhythm.     Heart sounds: Normal heart sounds.  Pulmonary:     Effort: Pulmonary effort is normal.     Breath sounds: Normal breath sounds. No wheezing.  Chest:  Breasts:    Right: No mass.     Left: No mass.  Abdominal:     General: Bowel sounds are normal.     Palpations: Abdomen is soft.     Tenderness: There is no abdominal tenderness.  Musculoskeletal:     Right shoulder: Decreased range of motion.     Left shoulder: Decreased range of motion.     Cervical back: Normal range of motion and neck supple.     Right lower leg: 1+ Pitting Edema present.     Left lower leg: 1+ Pitting Edema present.  Lymphadenopathy:     Cervical: No cervical adenopathy.  Skin:    General: Skin is warm and dry.  Neurological:     Mental Status: He is alert and oriented to person, place, and time.     Deep Tendon Reflexes: Reflexes are normal and symmetric.  Psychiatric:        Attention and Perception: Attention normal.        Mood and Affect: Mood  normal.        Thought Content: Thought content normal.     Wt Readings from Last 3 Encounters:  11/07/21 258 lb (117 kg)  05/05/21 245 lb 12.8 oz (111.5 kg)  12/04/20 260 lb (117.9 kg)    BP 134/76   Pulse 77   Ht _0  (1.676 m)   Wt 258 lb (117 kg)   SpO2 96%   BMI 41.64 kg/m   Assessment and Plan: 1. Annual physical exam Exam is normal except for weight. Encourage regular exercise and appropriate dietary changes. Work on reducing intake of sweets. Suggest taking Tylenol at bedtime to decrease shoulder discomfort and promote sleep  2. Essential hypertension Clinically stable exam with well controlled BP. If mild edema continues, consider adding low dose diuretic Tolerating medications without side effects at this time. Pt to continue current regimen and low sodium diet; benefits of regular exercise as able discussed. - CBC with Differential/Platelet - Comprehensive metabolic panel  3. Prediabetes - Hemoglobin A1c  4. Mild hyperlipidemia - Lipid panel  5. History of prostate cancer Followed by Urology  6. BMI 40.0-44.9, adult ([place]) Discussed diet changes for weight loss and blood sugar control  7. Need for shingles vaccine - Varicella-zoster vaccine IM   Partially dictated using Editor, commissioning. Any errors are unintentional.  Halina Maidens, MD [facility]  11/07/2021

## 2021-11-08 LAB — COMPREHENSIVE METABOLIC PANEL
ALT: 26 IU/L (ref 0–44)
AST: 26 IU/L (ref 0–40)
Albumin/Globulin Ratio: 1.6 (ref 1.2–2.2)
Albumin: 4.5 g/dL (ref 3.9–4.9)
Alkaline Phosphatase: 68 IU/L (ref 44–121)
BUN/Creatinine Ratio: 11 (ref 10–24)
BUN: 14 mg/dL (ref 8–27)
Bilirubin Total: 0.5 mg/dL (ref 0.0–1.2)
CO2: 24 mmol/L (ref 20–29)
Calcium: 9.8 mg/dL (ref 8.6–10.2)
Chloride: 102 mmol/L (ref 96–106)
Creatinine, Ser: 1.25 mg/dL (ref 0.76–1.27)
Globulin, Total: 2.8 g/dL (ref 1.5–4.5)
Glucose: 109 mg/dL — ABNORMAL HIGH (ref 70–99)
Potassium: 4.9 mmol/L (ref 3.5–5.2)
Sodium: 141 mmol/L (ref 134–144)
Total Protein: 7.3 g/dL (ref 6.0–8.5)
eGFR: 62 mL/min/{1.73_m2} (ref >59)

## 2021-11-08 LAB — CBC WITH DIFFERENTIAL/PLATELET
Basophils Absolute: 0.1 10*3/uL (ref 0.0–0.2)
Basos: 1 %
EOS (ABSOLUTE): 0.2 10*3/uL (ref 0.0–0.4)
Eos: 3 %
Hematocrit: 43.6 % (ref 37.5–51.0)
Hemoglobin: 15.2 g/dL (ref 13.0–17.7)
Immature Grans (Abs): 0 10*3/uL (ref 0.0–0.1)
Immature Granulocytes: 0 %
Lymphocytes Absolute: 1.1 10*3/uL (ref 0.7–3.1)
Lymphs: 19 %
MCH: 30.5 pg (ref 26.6–33.0)
MCHC: 34.9 g/dL (ref 31.5–35.7)
MCV: 87 fL (ref 79–97)
Monocytes Absolute: 0.6 10*3/uL (ref 0.1–0.9)
Monocytes: 11 %
Neutrophils Absolute: 3.6 10*3/uL (ref 1.4–7.0)
Neutrophils: 66 %
Platelets: 192 10*3/uL (ref 150–450)
RBC: 4.99 x10E6/uL (ref 4.14–5.80)
RDW: 13 % (ref 11.6–15.4)
WBC: 5.6 10*3/uL (ref 3.4–10.8)

## 2021-11-08 LAB — HEMOGLOBIN A1C
Est. average glucose Bld gHb Est-mCnc: 131 mg/dL
Hgb A1c MFr Bld: 6.2 % — ABNORMAL HIGH (ref 4.8–5.6)

## 2021-11-08 LAB — LIPID PANEL
Chol/HDL Ratio: 4.4 ratio (ref 0.0–5.0)
Cholesterol, Total: 183 mg/dL (ref 100–199)
HDL: 42 mg/dL (ref >39)
LDL Chol Calc (NIH): 116 mg/dL — ABNORMAL HIGH (ref 0–99)
Triglycerides: 142 mg/dL (ref 0–149)
VLDL Cholesterol Cal: 25 mg/dL (ref 5–40)

## 2022-04-23 ENCOUNTER — Other Ambulatory Visit: Payer: Self-pay | Admitting: Internal Medicine

## 2022-04-23 DIAGNOSIS — I10 Essential (primary) hypertension: Secondary | ICD-10-CM

## 2022-05-08 ENCOUNTER — Ambulatory Visit: Payer: Managed Care, Other (non HMO) | Admitting: Internal Medicine

## 2022-05-08 ENCOUNTER — Ambulatory Visit (INDEPENDENT_AMBULATORY_CARE_PROVIDER_SITE_OTHER): Payer: Managed Care, Other (non HMO)

## 2022-05-08 DIAGNOSIS — Z23 Encounter for immunization: Secondary | ICD-10-CM | POA: Diagnosis not present

## 2022-05-11 ENCOUNTER — Ambulatory Visit: Payer: Managed Care, Other (non HMO) | Admitting: Internal Medicine

## 2022-05-11 ENCOUNTER — Encounter: Payer: Self-pay | Admitting: Internal Medicine

## 2022-05-11 VITALS — BP 122/76 | HR 76 | Ht 66.0 in | Wt 253.0 lb

## 2022-05-11 DIAGNOSIS — I10 Essential (primary) hypertension: Secondary | ICD-10-CM | POA: Diagnosis not present

## 2022-05-11 DIAGNOSIS — R7303 Prediabetes: Secondary | ICD-10-CM | POA: Diagnosis not present

## 2022-05-11 LAB — POCT GLYCOSYLATED HEMOGLOBIN (HGB A1C): Hemoglobin A1C: 6.2 % — AB (ref 4.0–5.6)

## 2022-05-11 NOTE — Assessment & Plan Note (Signed)
Clinically stable exam with well controlled BP on olmesartan. Tolerating medications without side effects. Pt to continue current regimen and low sodium diet.  

## 2022-05-11 NOTE — Progress Notes (Signed)
Date:  05/11/2022   Name:  Karl Gentry   DOB:  1950-02-12   MRN:  [id number]   Chief Complaint: Hypertension  Hypertension This is a chronic problem. The problem is controlled. Pertinent negatives include no chest pain, headaches, palpitations or shortness of breath. Past treatments include angiotensin blockers. The current treatment provides significant improvement.  Diabetes He presents for his follow-up diabetic visit. Diabetes type: prediabetes. Pertinent negatives for hypoglycemia include no dizziness or headaches. Pertinent negatives for diabetes include no chest pain, no fatigue and no weakness. When asked about current treatments, none (not really following a low carb diet) were reported.    Lab Results  Component Value Date   NA 141 11/07/2021   K 4.9 11/07/2021   CO2 24 11/07/2021   GLUCOSE 109 (H) 11/07/2021   BUN 14 11/07/2021   CREATININE 1.25 11/07/2021   CALCIUM 9.8 11/07/2021   EGFR 62 11/07/2021   GFRNONAA 93 11/03/2019   Lab Results  Component Value Date   CHOL 183 11/07/2021   HDL 42 11/07/2021   LDLCALC 116 (H) 11/07/2021   TRIG 142 11/07/2021   CHOLHDL 4.4 11/07/2021   Lab Results  Component Value Date   TSH 2.390 06/21/2018   Lab Results  Component Value Date   HGBA1C 6.2 (A) 05/11/2022   Lab Results  Component Value Date   WBC 5.6 11/07/2021   HGB 15.2 11/07/2021   HCT 43.6 11/07/2021   MCV 87 11/07/2021   PLT 192 11/07/2021   Lab Results  Component Value Date   ALT 26 11/07/2021   AST 26 11/07/2021   ALKPHOS 68 11/07/2021   BILITOT 0.5 11/07/2021   No results found for: "25OHVITD2", "25OHVITD3", "VD25OH"   Review of Systems  Constitutional:  Negative for fatigue and unexpected weight change.  HENT:  Negative for nosebleeds.   Eyes:  Negative for visual disturbance.  Respiratory:  Negative for cough, chest tightness, shortness of breath and wheezing.   Cardiovascular:  Negative for chest pain, palpitations and leg swelling.   Gastrointestinal:  Negative for abdominal pain, constipation and diarrhea.  Neurological:  Negative for dizziness, weakness, light-headedness and headaches.    Patient Active Problem List   Diagnosis Date Noted   Mild hyperlipidemia 11/07/2021   Polyp of descending colon    Polyp of colon    Prediabetes 11/06/2020   BMI 40.0-44.9, adult 11/03/2019   S/P repair of ventral hernia 04/06/2019   Essential hypertension 06/21/2018   History of prostate cancer 04/23/2011    Allergies  Allergen Reactions   Molds & Smuts     Mildew and cats also cause irritated eyes, itching, running nose    Past Surgical History:  Procedure Laterality Date   ABDOMINAL WALL DEFECT REPAIR N/A 04/06/2019   Procedure: REPAIR ABDOMINAL WALL;  Surgeon: Jules Husbands, MD;  Location: [facility];  Service: General;  Laterality: N/A;   APPENDECTOMY  04/06/2019   Procedure: APPENDECTOMY;  Surgeon: Jules Husbands, MD;  Location: [facility];  Service: General;;   BIOPSY PROSTATE  2013   bx. 2'13 [place]   COLONOSCOPY WITH PROPOFOL N/A 12/04/2020   Procedure: COLONOSCOPY WITH PROPOFOL;  Surgeon: Lin Landsman, MD;  Location: [facility];  Service: Gastroenterology;  Laterality: N/A;   HERNIA REPAIR Right 1977    -open rt. right inguinal hernia repair/mesh   INGUINAL HERNIA REPAIR Bilateral 04/06/2019   Procedure: HERNIA REPAIR INGUINAL ADULT BILATERAL;  Surgeon: Jules Husbands, MD;  Location: [facility];  Service: General;  Laterality: Bilateral;   ROBOT ASSISTED LAPAROSCOPIC RADICAL PROSTATECTOMY  04/23/2011   Procedure: ROBOTIC ASSISTED LAPAROSCOPIC RADICAL PROSTATECTOMY LEVEL 2;  Surgeon: Dutch Gray, MD;  Location: [facility];  Service: Urology;  Laterality: N/A;     VENTRAL HERNIA REPAIR N/A 04/06/2019   Procedure: HERNIA REPAIR VENTRAL ADULT;  Surgeon: Jules Husbands, MD;  Location: [facility];  Service: General;  Laterality: N/A;    Social History   Tobacco Use   Smoking status: Former     Types: Pipe    Quit date: 1970    Years since quitting: 54.2   Smokeless tobacco: Former    Types: Nurse, children's Use: Never used  Substance Use Topics   Alcohol use: No   Drug use: No     Medication list has been reviewed and updated.  Current Meds  Medication Sig   olmesartan (BENICAR) 20 MG tablet TAKE 1 TABLET BY MOUTH EVERY DAY       05/11/2022    3:35 PM 11/07/2021    8:08 AM 05/05/2021    9:03 AM 11/05/2020    8:46 AM  GAD 7 : Generalized Anxiety Score  Nervous, Anxious, on Edge 0 0 0 0  Control/stop worrying 0 0 0 0  Worry too much - different things 0 0 0 0  Trouble relaxing 0 0 0 0  Restless 0 0 0 0  Easily annoyed or irritable 0 0 0 0  Afraid - awful might happen 0 0 0 0  Total GAD 7 Score 0 0 0 0  Anxiety Difficulty Not difficult at all Not difficult at all Not difficult at all Not difficult at all       05/11/2022    3:35 PM 11/07/2021    8:08 AM 05/05/2021    9:02 AM  Depression screen PHQ 2/9  Decreased Interest 0 0 0  Down, Depressed, Hopeless 0 0 0  PHQ - 2 Score 0 0 0  Altered sleeping 0 0 0  Tired, decreased energy 0 0 0  Change in appetite 0 0 0  Feeling bad or failure about yourself  0 0 0  Trouble concentrating 0 0 0  Moving slowly or fidgety/restless 0 0 0  Suicidal thoughts 0 0 0  PHQ-9 Score 0 0 0  Difficult doing work/chores Not difficult at all Not difficult at all Not difficult at all    BP Readings from Last 3 Encounters:  05/11/22 122/76  11/07/21 128/77  05/05/21 132/78    Physical Exam Vitals and nursing note reviewed.  Constitutional:      General: He is not in acute distress.    Appearance: He is well-developed.  HENT:     Head: Normocephalic and atraumatic.  Neck:     Vascular: No carotid bruit.  Cardiovascular:     Rate and Rhythm: Normal rate and regular rhythm.     Heart sounds: No murmur heard. Pulmonary:     Effort: Pulmonary effort is normal. No respiratory distress.     Breath sounds: No wheezing  or rhonchi.  Musculoskeletal:     Cervical back: Normal range of motion.     Right lower leg: No edema.     Left lower leg: No edema.  Lymphadenopathy:     Cervical: No cervical adenopathy.  Skin:    General: Skin is warm and dry.     Capillary Refill: Capillary refill takes less than 2 seconds.     Findings:  No rash.  Neurological:     General: No focal deficit present.     Mental Status: He is alert and oriented to person, place, and time.  Psychiatric:        Mood and Affect: Mood normal.        Behavior: Behavior normal.     Wt Readings from Last 3 Encounters:  05/11/22 253 lb (114.8 kg)  11/07/21 258 lb (117 kg)  05/05/21 245 lb 12.8 oz (111.5 kg)    BP 122/76   Pulse 76   Ht 5\' 6"  (1.676 m)   Wt 253 lb (114.8 kg)   SpO2 97%   BMI 40.84 kg/m   Assessment and Plan:  Problem List Items Addressed This Visit       Cardiovascular and Mediastinum   Essential hypertension - Primary (Chronic)    Clinically stable exam with well controlled BP on olmesartan. Tolerating medications without side effects. Pt to continue current regimen and low sodium diet.         Other   Prediabetes (Chronic)    Low carb diet recommended. Last A1C 6.2 He has lost 5 lbs since last visit without diet changes A1C today is 6.2 - he will work on one diet change at a time.      Relevant Orders   POCT glycosylated hemoglobin (Hb A1C) (Completed)    Return in about 6 months (around 11/10/2022) for CPX.   Partially dictated using [place], any errors are not intentional.  Glean Hess, MD [place], [place]

## 2022-05-11 NOTE — Assessment & Plan Note (Addendum)
Low carb diet recommended. Last A1C 6.2 He has lost 5 lbs since last visit without diet changes A1C today is 6.2 - he will work on one diet change at a time.

## 2022-07-31 ENCOUNTER — Other Ambulatory Visit (HOSPITAL_COMMUNITY): Payer: Self-pay | Admitting: Urology

## 2022-07-31 DIAGNOSIS — C61 Malignant neoplasm of prostate: Secondary | ICD-10-CM

## 2022-08-20 ENCOUNTER — Ambulatory Visit (HOSPITAL_COMMUNITY)
Admission: RE | Admit: 2022-08-20 | Discharge: 2022-08-20 | Disposition: A | Discharge status: Home / Self Care | Payer: Managed Care, Other (non HMO) | Source: Ambulatory Visit | Attending: Urology | Admitting: Urology

## 2022-08-20 DIAGNOSIS — C61 Malignant neoplasm of prostate: Secondary | ICD-10-CM | POA: Diagnosis not present

## 2022-08-20 MED ORDER — PIFLIFOLASTAT F 18 (PYLARIFY) INJECTION
9.0000 | Freq: Once | INTRAVENOUS | Status: AC
  Administered 2022-08-20: 8.63 via INTRAVENOUS

## 2022-09-08 LAB — PSA: PSA: 0.25

## 2022-09-21 NOTE — Progress Notes (Signed)
GU Location of Tumor / Histology:  Biochemically recurrent prostate cancer  Robotic Assisted Prostatectomy (2013)  PSA  5.98 on  08/28/2022 PSA  1.93 on  04/15/2022 PSA  0.75 on  12/17/2021 PSA  0.25 on  08/26/2021  08/20/2022 Dr. Heloise Purpura NM PET (PSMA) Skull to Mid Thigh CLINICAL DATA: Prostate carcinoma with biochemical recurrence.  IMPRESSION: 1. There are 2 small left external and left internal iliac lymph nodes which demonstrate increased radiotracer activity compatible with nodal metastasis. 2. No signs of tracer avid local recurrence within the prostate bed. 3. No signs of tracer avid visceral metastasis or skeletal metastasis. 4. Midline ventral abdominal wall hernia contains nonobstructed loops of bowel. 5.  Aortic Atherosclerosis (ICD10-I70.0).   Past/Anticipated interventions by urology, if any:     Past/Anticipated interventions by medical oncology, if any: NA  Weight changes, if any: {:18581}  IPSS: SHIM:  Bowel/Bladder complaints, if any: {:18581}   Nausea/Vomiting, if any: {:18581}  Pain issues, if any:  {:18581}  SAFETY ISSUES: Prior radiation? {:18581} Pacemaker/ICD? {:18581} Possible current pregnancy? Male Is the patient on methotrexate? No  Current Complaints / other details:

## 2022-09-23 ENCOUNTER — Encounter: Payer: Self-pay | Admitting: Radiation Oncology

## 2022-09-23 ENCOUNTER — Other Ambulatory Visit: Payer: Self-pay

## 2022-09-23 ENCOUNTER — Ambulatory Visit
Admission: RE | Admit: 2022-09-23 | Discharge: 2022-09-23 | Disposition: A | Discharge status: Home / Self Care | Payer: Managed Care, Other (non HMO) | Source: Ambulatory Visit | Attending: Radiation Oncology | Admitting: Radiation Oncology

## 2022-09-23 VITALS — BP 139/78 | HR 79 | Temp 97.7°F | Resp 18 | Ht 66.0 in | Wt 261.1 lb

## 2022-09-23 DIAGNOSIS — C61 Malignant neoplasm of prostate: Secondary | ICD-10-CM

## 2022-09-23 DIAGNOSIS — C775 Secondary and unspecified malignant neoplasm of intrapelvic lymph nodes: Secondary | ICD-10-CM | POA: Insufficient documentation

## 2022-09-23 NOTE — Progress Notes (Signed)
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    ICD-10-CM   1. 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)  C61    C77.5       HISTORY OF PRESENT ILLNESS: Karl Gentry is a 72 y.o. male seen at the request of Karl Gentry. He is s/p BNS robotic assisted laparoscopic prostatectomy on 04/23/2011, under the care of Karl Gentry for pT2c N0 Gleason 4+3 adenocarcinoma of the prostate with a pretreatment PSA of 5.5.  His surgical pathology confirmed no extraprostatic extension, negative surgical margins, no lymph node involvement and no seminal vesicle involvement.  However, unfortunately, his PSA never became completely undetectable postoperatively at 0.23 so he proceeded with salvage radiotherapy to the prostate fossa under the care of Dr. [place] Scrape in March 2014.  His PSA nadired at 0.04 following completion of salvage radiotherapy but he was noted to have a biochemical recurrence in September 2016 when his PSA reached 0.26.  His PSA continued gradually rising thereafter, reaching 6.72 in October 2019 with a 28-month doubling time.  CT A/P and bone scan were performed for disease restaging and did not show any evidence of measurable recurrent disease or metastasis so he elected to continue to monitor the PSA.  The PSA reached 13.40 in November 2020 so he had repeat restaging imaging with bone scan and CT on 01/09/2019 which again were negative for obvious metastatic disease.  He was started on Eligard ADT on 02/01/2019 and PSA remained undetectable while on this therapy so he opted to discontinue the ADT  as of January 2022 since it was causing unpleasant side effects, particularly weight gain and fatigue.  The PSA again became very low detectable at 0.058 in March 2023 and continued to rise, up to 1.93 in March 2024.  8 restaging PSMA PET scan was performed on 08/20/2022 and did show nodal metastasis in a left external and left internal iliac lymph node.  A repeat PSA on 08/28/2022 was further elevated at 5.98 so after further discussion with Karl Gentry, he has been kindly referred to [place] today to discuss his radiation treatment options.  PREVIOUS RADIATION THERAPY: Yes  03/02/2012 through 04/20/2012: Prostate bed 6600 cGy 33 sessions (Dr. [place] Scrape)  PAST MEDICAL HISTORY:  Past Medical History:  Diagnosis Date   Abdominal pain    Arthritis    right shoulder, no cartillage   Cancer (HCC) 04-13-11   recent dx. prostate cancer, surgery planned   ED (erectile dysfunction)    History of elevated PSA 2021   PSA starting to rise again. unable to get all of cancer during surgery   Hypertension    Inguinal hernia bilateral, non-recurrent 04-13-11   states bilateral at present / umbilical hernia also   Prostate cancer (HCC) 04/23/11   bx=Adenocarcinoma,Gleason=4+3=7,PSA=5.5,volume  =36cc   Radiation 03/02/2012-04/20/2012   prostate bed 6600 cGy   Shortness of breath    Stress incontinence, male    Ventral hernia without obstruction or gangrene 06/21/2018      PAST SURGICAL HISTORY: Past Surgical History:  Procedure Laterality Date   ABDOMINAL WALL DEFECT REPAIR N/A 04/06/2019  Procedure: REPAIR ABDOMINAL WALL;  Surgeon: Leafy Ro, MD;  Location: [facility];  Service: General;  Laterality: N/A;   APPENDECTOMY  04/06/2019   Procedure: APPENDECTOMY;  Surgeon: Leafy Ro, MD;  Location: [facility];  Service: General;;   BIOPSY PROSTATE  2013   bx. 2'13 [place] [facility]   COLONOSCOPY WITH PROPOFOL N/A 12/04/2020   Procedure: COLONOSCOPY WITH PROPOFOL;  Surgeon: Toney Reil, MD;   Location: [facility];  Service: Gastroenterology;  Laterality: N/A;   HERNIA REPAIR Right 1977    -open rt. right inguinal hernia repair/mesh   INGUINAL HERNIA REPAIR Bilateral 04/06/2019   Procedure: HERNIA REPAIR INGUINAL ADULT BILATERAL;  Surgeon: Leafy Ro, MD;  Location: [facility];  Service: General;  Laterality: Bilateral;   ROBOT ASSISTED LAPAROSCOPIC RADICAL PROSTATECTOMY  04/23/2011   Procedure: ROBOTIC ASSISTED LAPAROSCOPIC RADICAL PROSTATECTOMY LEVEL 2;  Surgeon: Crecencio Mc, MD;  Location: [facility];  Service: Urology;  Laterality: N/A;     VENTRAL HERNIA REPAIR N/A 04/06/2019   Procedure: HERNIA REPAIR VENTRAL ADULT;  Surgeon: Leafy Ro, MD;  Location: [facility];  Service: General;  Laterality: N/A;    FAMILY HISTORY:  Family History  Problem Relation Age of Onset   Cancer Mother        breast age 31   Cancer Father        colon age 19   Hearing loss Father    Heart disease Father     SOCIAL HISTORY:  Social History   Socioeconomic History   Marital status: Married    Spouse name: Karl Gentry   Number of children: 2   Years of education: Not on file   Highest education level: Not on file  Occupational History    Employer: [employer]  Tobacco Use   Smoking status: Former    Types: Pipe    Quit date: 1970    Years since quitting: 54.6   Smokeless tobacco: Former    Types: Associate Professor status: Never Used  Substance and Sexual Activity   Alcohol use: No   Drug use: No   Sexual activity: Yes  Other Topics Concern   Not on file  Social History Narrative   Not on file   Social Determinants of Health   Financial Resource Strain: Not on file  Food Insecurity: Not on file  Transportation Needs: Not on file  Physical Activity: Not on file  Stress: Not on file  Social Connections: Not on file  Intimate Partner Violence: Not on file    ALLERGIES: Molds & smuts  MEDICATIONS:  Current Outpatient Medications  Medication Sig  Dispense Refill   olmesartan (BENICAR) 20 MG tablet TAKE 1 TABLET BY MOUTH EVERY DAY 90 tablet 1   No current facility-administered medications for this encounter.    REVIEW OF SYSTEMS:  On review of systems, the patient reports that he is doing well overall. He denies any chest pain, shortness of breath, cough, fevers, chills, night sweats, unintended weight changes. He denies any bowel or bladder disturbances, and denies abdominal pain, nausea or vomiting.  His IPSS score is 1, indicating mild lower urinary tract symptoms only.  His SHIM score was 5, indicating severe postoperative erectile dysfunction but this is not currently a high priority for him.  He denies any new musculoskeletal or joint aches or pains. A complete review of systems is obtained and is otherwise negative.    PHYSICAL EXAM:  Wt Readings from Last 3 Encounters:  09/23/22 261 lb 2 oz (118.4 kg)  05/11/22 253 lb (114.8 kg)  11/07/21 258 lb (117 kg)   Temp Readings from Last 3 Encounters:  09/23/22 97.7 F (36.5 C) (Temporal)  12/04/20 97.7 F (36.5 C) (Temporal)  09/07/20 98.2 F (36.8 C) (Oral)   BP Readings from Last 3 Encounters:  09/23/22 139/78  05/11/22 122/76  11/07/21 128/77   Pulse Readings from Last 3 Encounters:  09/23/22 79  05/11/22 76  11/07/21 77    /10  In general this is a well appearing Caucasian man in no acute distress.  He's alert and oriented x4 and appropriate throughout the examination. Cardiopulmonary assessment is negative for acute distress and he exhibits normal effort.   KPS = 100  100 - Normal; no complaints; no evidence of disease. 90   - Able to carry on normal activity; minor signs or symptoms of disease. 80   - Normal activity with effort; some signs or symptoms of disease. 69   - Cares for self; unable to carry on normal activity or to do active work. 60   - Requires occasional assistance, but is able to care for most of his personal needs. 50   - Requires  considerable assistance and frequent medical care. 40   - Disabled; requires special care and assistance. 30   - Severely disabled; hospital admission is indicated although death not imminent. 20   - Very sick; hospital admission necessary; active supportive treatment necessary. 10   - Moribund; fatal processes progressing rapidly. 0     - Dead  Karnofsky DA, Abelmann WH, Craver LS and Burchenal [facility] [phone]) The use of the nitrogen mustards in the palliative treatment of carcinoma: with particular reference to bronchogenic carcinoma Cancer 1 634-56  LABORATORY DATA:  Lab Results  Component Value Date   WBC 5.6 11/07/2021   HGB 15.2 11/07/2021   HCT 43.6 11/07/2021   MCV 87 11/07/2021   PLT 192 11/07/2021   Lab Results  Component Value Date   NA 141 11/07/2021   K 4.9 11/07/2021   CL 102 11/07/2021   CO2 24 11/07/2021   Lab Results  Component Value Date   ALT 26 11/07/2021   AST 26 11/07/2021   ALKPHOS 68 11/07/2021   BILITOT 0.5 11/07/2021     RADIOGRAPHY: No results found.    IMPRESSION/PLAN: 1. 72 y.o. 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  Today, we talked to the patient about the findings and workup thus far. We discussed the natural history of prostate adenocarcinoma and general treatment, highlighting the role of stereotactic body radiotherapy (SBRT) in the management of oligometastatic disease. We discussed the available radiation techniques, and focused on the details and logistics of delivery.  The recommendation is for a 10 fraction course of focused SBRT to the PET positive pelvic lymph nodes and nearby nodal echelon.  We reviewed the anticipated acute and late sequelae associated with radiation in this setting. The patient was encouraged to ask questions that were answered to his stated satisfaction.  At the conclusion of our conversation, the patient is interested in proceeding with the  recommended 10 fraction course of focused SBRT to the PET positive pelvic lymph nodes and nearby nodal echelon.  He appears to have a good understanding of his disease and our treatment recommendations which are of curative intent.  He understands that while there is a relatively low probability for cure  with this treatment, there is a high probability for durable, long-term control of his disease.  He has freely signed written consent to proceed today in the office and a copy of this document will be placed in his medical record.  He is tentatively scheduled for CT simulation at 8 AM on Friday, 09/25/2022 so we will share our discussion with Karl Gentry and move forward with treatment planning accordingly, in anticipation of beginning his daily treatments in the near future We enjoyed meeting with him today and look forward to continuing to participate in his care. He knows that he is welcome to call anytime with any questions or concerns in the interim.     We personally spent 70 minutes in this encounter including chart review, reviewing radiological studies, meeting face-to-face with the patient, entering orders and completing documentation.    Marguarite Arbour, PA-C    Margaretmary Dys, MD  [facility]  Radiation Oncology Direct Dial: [phone]  Fax: [phone] [email or web address]  Skype  LinkedIn

## 2022-09-25 ENCOUNTER — Ambulatory Visit
Admission: RE | Admit: 2022-09-25 | Discharge: 2022-09-25 | Disposition: A | Discharge status: Home / Self Care | Payer: Managed Care, Other (non HMO) | Source: Ambulatory Visit | Attending: Radiation Oncology | Admitting: Radiation Oncology

## 2022-09-25 ENCOUNTER — Other Ambulatory Visit: Payer: Self-pay

## 2022-09-25 DIAGNOSIS — C61 Malignant neoplasm of prostate: Secondary | ICD-10-CM | POA: Diagnosis not present

## 2022-09-25 DIAGNOSIS — C775 Secondary and unspecified malignant neoplasm of intrapelvic lymph nodes: Secondary | ICD-10-CM | POA: Insufficient documentation

## 2022-09-25 NOTE — Progress Notes (Signed)
  Radiation Oncology         [phone] ________________________________  Name: Karl Gentry MRN: [id number]  Date: 09/25/2022  DOB: Jan 20, 1951  ULTRAHYPOFRACTIONATED RADIOTHERAPY  SIMULATION AND TREATMENT PLANNING NOTE    ICD-10-CM   1. 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   NARRATIVE:  The patient was brought to the CT Simulation planning suite.  Identity was confirmed.  All relevant records and images related to the planned course of therapy were reviewed.  The patient freely provided informed written consent to proceed with treatment after reviewing the details related to the planned course of therapy. The consent form was witnessed and verified by the simulation staff.  Then, the patient was set-up in a stable reproducible  supine position for radiation therapy.  A BodyFix immobilization pillow was fabricated for reproducible positioning.  Surface markings were placed.  The CT images were loaded into the planning software.  The gross target volumes (GTV) and planning target volumes (PTV) were delinieated, and avoidance structures were contoured.  Treatment planning then occurred.  The radiation prescription was entered and confirmed.  A total of two complex treatment devices were fabricated in the form of the BodyFix immobilization pillow and a neck accuform cushion.  I have requested : 3D Simulation  I have requested a DVH of the following structures: targets and all normal structures near the target including bladder, bowel, femoral heads, skin and others as noted on the radiation plan to maintain doses in adherence with established limits  SPECIAL TREATMENT PROCEDURE:  The planned course of therapy using radiation constitutes a special treatment procedure. Special care is required in the  management of this patient for the following reasons. High dose per fraction requiring special monitoring for increased toxicities of treatment including daily imaging..  The special nature of the planned course of radiotherapy will require increased physician supervision and oversight to ensure patient's safety with optimal treatment outcomes.    This requires extended time and effort.    PLAN:  The patient will receive 50 Gy in 10 fraction to the PET positive pelvic lymph nodes and 30 Gy lower dose to the nearby nodal echelon .  ________________________________  Artist Pais Kathrynn Running, M.D.

## 2022-10-08 DIAGNOSIS — C775 Secondary and unspecified malignant neoplasm of intrapelvic lymph nodes: Secondary | ICD-10-CM | POA: Diagnosis not present

## 2022-10-14 ENCOUNTER — Encounter: Payer: Self-pay | Admitting: Radiation Oncology

## 2022-10-15 ENCOUNTER — Other Ambulatory Visit: Payer: Self-pay

## 2022-10-15 ENCOUNTER — Ambulatory Visit
Admission: RE | Admit: 2022-10-15 | Discharge: 2022-10-15 | Disposition: A | Discharge status: Home / Self Care | Payer: Managed Care, Other (non HMO) | Source: Ambulatory Visit | Attending: Radiation Oncology | Admitting: Radiation Oncology

## 2022-10-15 DIAGNOSIS — Z510 Encounter for antineoplastic radiation therapy: Secondary | ICD-10-CM | POA: Diagnosis present

## 2022-10-15 DIAGNOSIS — C61 Malignant neoplasm of prostate: Secondary | ICD-10-CM | POA: Diagnosis not present

## 2022-10-15 DIAGNOSIS — C775 Secondary and unspecified malignant neoplasm of intrapelvic lymph nodes: Secondary | ICD-10-CM | POA: Diagnosis not present

## 2022-10-15 LAB — RAD ONC ARIA SESSION SUMMARY
Course Elapsed Days: 0
Plan Fractions Treated to Date: 1
Plan Prescribed Dose Per Fraction: 5 Gy
Plan Total Fractions Prescribed: 10
Plan Total Prescribed Dose: 50 Gy
Reference Point Dosage Given to Date: 5 Gy
Reference Point Session Dosage Given: 5 Gy
Session Number: 1

## 2022-10-16 ENCOUNTER — Ambulatory Visit
Admission: RE | Admit: 2022-10-16 | Discharge: 2022-10-16 | Disposition: A | Discharge status: Home / Self Care | Payer: Managed Care, Other (non HMO) | Source: Ambulatory Visit | Attending: Radiation Oncology | Admitting: Radiation Oncology

## 2022-10-16 ENCOUNTER — Other Ambulatory Visit: Payer: Self-pay

## 2022-10-16 DIAGNOSIS — Z510 Encounter for antineoplastic radiation therapy: Secondary | ICD-10-CM | POA: Diagnosis not present

## 2022-10-16 LAB — RAD ONC ARIA SESSION SUMMARY
Course Elapsed Days: 1
Plan Fractions Treated to Date: 2
Plan Prescribed Dose Per Fraction: 5 Gy
Plan Total Fractions Prescribed: 10
Plan Total Prescribed Dose: 50 Gy
Reference Point Dosage Given to Date: 10 Gy
Reference Point Session Dosage Given: 5 Gy
Session Number: 2

## 2022-10-19 ENCOUNTER — Other Ambulatory Visit: Payer: Self-pay

## 2022-10-19 ENCOUNTER — Ambulatory Visit
Admission: RE | Admit: 2022-10-19 | Discharge: 2022-10-19 | Disposition: A | Discharge status: Home / Self Care | Payer: Managed Care, Other (non HMO) | Source: Ambulatory Visit | Attending: Radiation Oncology | Admitting: Radiation Oncology

## 2022-10-19 DIAGNOSIS — Z510 Encounter for antineoplastic radiation therapy: Secondary | ICD-10-CM | POA: Diagnosis not present

## 2022-10-19 LAB — RAD ONC ARIA SESSION SUMMARY
Course Elapsed Days: 4
Plan Fractions Treated to Date: 3
Plan Prescribed Dose Per Fraction: 5 Gy
Plan Total Fractions Prescribed: 10
Plan Total Prescribed Dose: 50 Gy
Reference Point Dosage Given to Date: 15 Gy
Reference Point Session Dosage Given: 5 Gy
Session Number: 3

## 2022-10-20 ENCOUNTER — Other Ambulatory Visit: Payer: Self-pay

## 2022-10-20 ENCOUNTER — Ambulatory Visit
Admission: RE | Admit: 2022-10-20 | Discharge: 2022-10-20 | Disposition: A | Discharge status: Home / Self Care | Payer: Managed Care, Other (non HMO) | Source: Ambulatory Visit | Attending: Radiation Oncology | Admitting: Radiation Oncology

## 2022-10-20 DIAGNOSIS — Z510 Encounter for antineoplastic radiation therapy: Secondary | ICD-10-CM | POA: Diagnosis not present

## 2022-10-20 LAB — RAD ONC ARIA SESSION SUMMARY
Course Elapsed Days: 5
Plan Fractions Treated to Date: 4
Plan Prescribed Dose Per Fraction: 5 Gy
Plan Total Fractions Prescribed: 10
Plan Total Prescribed Dose: 50 Gy
Reference Point Dosage Given to Date: 20 Gy
Reference Point Session Dosage Given: 5 Gy
Session Number: 4

## 2022-10-21 ENCOUNTER — Other Ambulatory Visit: Payer: Self-pay

## 2022-10-21 ENCOUNTER — Ambulatory Visit
Admission: RE | Admit: 2022-10-21 | Discharge: 2022-10-21 | Disposition: A | Discharge status: Home / Self Care | Payer: Managed Care, Other (non HMO) | Source: Ambulatory Visit | Attending: Radiation Oncology | Admitting: Radiation Oncology

## 2022-10-21 DIAGNOSIS — Z510 Encounter for antineoplastic radiation therapy: Secondary | ICD-10-CM | POA: Diagnosis not present

## 2022-10-21 LAB — RAD ONC ARIA SESSION SUMMARY
Course Elapsed Days: 6
Plan Fractions Treated to Date: 5
Plan Prescribed Dose Per Fraction: 5 Gy
Plan Total Fractions Prescribed: 10
Plan Total Prescribed Dose: 50 Gy
Reference Point Dosage Given to Date: 25 Gy
Reference Point Session Dosage Given: 5 Gy
Session Number: 5

## 2022-10-22 ENCOUNTER — Other Ambulatory Visit: Payer: Self-pay

## 2022-10-22 ENCOUNTER — Ambulatory Visit
Admission: RE | Admit: 2022-10-22 | Discharge: 2022-10-22 | Disposition: A | Discharge status: Home / Self Care | Payer: Managed Care, Other (non HMO) | Source: Ambulatory Visit | Attending: Radiation Oncology | Admitting: Radiation Oncology

## 2022-10-22 DIAGNOSIS — Z510 Encounter for antineoplastic radiation therapy: Secondary | ICD-10-CM | POA: Diagnosis not present

## 2022-10-22 LAB — RAD ONC ARIA SESSION SUMMARY
Course Elapsed Days: 7
Plan Fractions Treated to Date: 6
Plan Prescribed Dose Per Fraction: 5 Gy
Plan Total Fractions Prescribed: 10
Plan Total Prescribed Dose: 50 Gy
Reference Point Dosage Given to Date: 30 Gy
Reference Point Session Dosage Given: 5 Gy
Session Number: 6

## 2022-10-23 ENCOUNTER — Other Ambulatory Visit: Payer: Self-pay

## 2022-10-23 ENCOUNTER — Ambulatory Visit: Payer: Managed Care, Other (non HMO)

## 2022-10-23 ENCOUNTER — Ambulatory Visit
Admission: RE | Admit: 2022-10-23 | Discharge: 2022-10-23 | Disposition: A | Discharge status: Home / Self Care | Payer: Managed Care, Other (non HMO) | Source: Ambulatory Visit | Attending: Radiation Oncology | Admitting: Radiation Oncology

## 2022-10-23 DIAGNOSIS — Z510 Encounter for antineoplastic radiation therapy: Secondary | ICD-10-CM | POA: Diagnosis not present

## 2022-10-23 LAB — RAD ONC ARIA SESSION SUMMARY
Course Elapsed Days: 8
Plan Fractions Treated to Date: 7
Plan Prescribed Dose Per Fraction: 5 Gy
Plan Total Fractions Prescribed: 10
Plan Total Prescribed Dose: 50 Gy
Reference Point Dosage Given to Date: 35 Gy
Reference Point Session Dosage Given: 5 Gy
Session Number: 7

## 2022-10-24 ENCOUNTER — Other Ambulatory Visit: Payer: Self-pay | Admitting: Internal Medicine

## 2022-10-24 DIAGNOSIS — I10 Essential (primary) hypertension: Secondary | ICD-10-CM

## 2022-10-26 ENCOUNTER — Ambulatory Visit
Admission: RE | Admit: 2022-10-26 | Discharge: 2022-10-26 | Disposition: A | Discharge status: Home / Self Care | Payer: Managed Care, Other (non HMO) | Source: Ambulatory Visit | Attending: Radiation Oncology | Admitting: Radiation Oncology

## 2022-10-26 ENCOUNTER — Other Ambulatory Visit: Payer: Self-pay

## 2022-10-26 DIAGNOSIS — Z510 Encounter for antineoplastic radiation therapy: Secondary | ICD-10-CM | POA: Diagnosis not present

## 2022-10-26 LAB — RAD ONC ARIA SESSION SUMMARY
Course Elapsed Days: 11
Plan Fractions Treated to Date: 8
Plan Prescribed Dose Per Fraction: 5 Gy
Plan Total Fractions Prescribed: 10
Plan Total Prescribed Dose: 50 Gy
Reference Point Dosage Given to Date: 40 Gy
Reference Point Session Dosage Given: 5 Gy
Session Number: 8

## 2022-10-27 ENCOUNTER — Other Ambulatory Visit: Payer: Self-pay

## 2022-10-27 ENCOUNTER — Ambulatory Visit
Admission: RE | Admit: 2022-10-27 | Discharge: 2022-10-27 | Disposition: A | Discharge status: Home / Self Care | Payer: Managed Care, Other (non HMO) | Source: Ambulatory Visit | Attending: Radiation Oncology | Admitting: Radiation Oncology

## 2022-10-27 DIAGNOSIS — Z510 Encounter for antineoplastic radiation therapy: Secondary | ICD-10-CM | POA: Diagnosis not present

## 2022-10-27 LAB — RAD ONC ARIA SESSION SUMMARY
Course Elapsed Days: 12
Plan Fractions Treated to Date: 9
Plan Prescribed Dose Per Fraction: 5 Gy
Plan Total Fractions Prescribed: 10
Plan Total Prescribed Dose: 50 Gy
Reference Point Dosage Given to Date: 45 Gy
Reference Point Session Dosage Given: 5 Gy
Session Number: 9

## 2022-10-28 ENCOUNTER — Ambulatory Visit
Admission: RE | Admit: 2022-10-28 | Discharge: 2022-10-28 | Disposition: A | Discharge status: Home / Self Care | Payer: Managed Care, Other (non HMO) | Source: Ambulatory Visit | Attending: Radiation Oncology | Admitting: Radiation Oncology

## 2022-10-28 ENCOUNTER — Other Ambulatory Visit: Payer: Self-pay

## 2022-10-28 DIAGNOSIS — Z510 Encounter for antineoplastic radiation therapy: Secondary | ICD-10-CM | POA: Diagnosis not present

## 2022-10-28 DIAGNOSIS — C61 Malignant neoplasm of prostate: Secondary | ICD-10-CM

## 2022-10-28 LAB — RAD ONC ARIA SESSION SUMMARY
Course Elapsed Days: 13
Plan Fractions Treated to Date: 10
Plan Prescribed Dose Per Fraction: 5 Gy
Plan Total Fractions Prescribed: 10
Plan Total Prescribed Dose: 50 Gy
Reference Point Dosage Given to Date: 50 Gy
Reference Point Session Dosage Given: 5 Gy
Session Number: 10

## 2022-10-30 NOTE — Radiation Completion Notes (Addendum)
  Radiation Oncology         [phone] ________________________________  Name: Karl Gentry MRN: [id number]  Date: 10/28/2022  DOB: Aug 18, 1950   Referring Physician: NORETTA FERRARA, M.D. Date of Service: 2022-10-30 Radiation Oncologist: Adina Barge, M.D. 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   Intent: Curative     ==========DELIVERED PLANS==========  First Treatment Date: 2022-10-15 - Last Treatment Date: 2022-10-28   Plan Name: Pelvis_UHRT Site: Pelvis Technique: IMRT Mode: Photon Dose Per Fraction: 5 Gy Prescribed Dose (Delivered / Prescribed): 50 Gy / 50 Gy Prescribed Fxs (Delivered / Prescribed): 10 / 10     ==========ON TREATMENT VISIT DATES========== 2022-10-16, 2022-10-26   The patient will receive a call in about one month from the radiation oncology department. He will continue follow up with Dr. FERRARA as well.  ------------------------------------------------   Donnice Barge, MD [facility]  Radiation Oncology Direct Dial: [phone]  Fax: [phone] [email or web address]  Skype  LinkedIn

## 2022-11-11 ENCOUNTER — Ambulatory Visit (INDEPENDENT_AMBULATORY_CARE_PROVIDER_SITE_OTHER): Payer: Managed Care, Other (non HMO) | Admitting: Internal Medicine

## 2022-11-11 ENCOUNTER — Encounter: Payer: Self-pay | Admitting: Internal Medicine

## 2022-11-11 VITALS — BP 128/76 | HR 95 | Ht 66.0 in | Wt 258.0 lb

## 2022-11-11 DIAGNOSIS — Z23 Encounter for immunization: Secondary | ICD-10-CM | POA: Diagnosis not present

## 2022-11-11 DIAGNOSIS — C775 Secondary and unspecified malignant neoplasm of intrapelvic lymph nodes: Secondary | ICD-10-CM | POA: Diagnosis not present

## 2022-11-11 DIAGNOSIS — E785 Hyperlipidemia, unspecified: Secondary | ICD-10-CM | POA: Diagnosis not present

## 2022-11-11 DIAGNOSIS — C61 Malignant neoplasm of prostate: Secondary | ICD-10-CM

## 2022-11-11 DIAGNOSIS — I10 Essential (primary) hypertension: Secondary | ICD-10-CM | POA: Diagnosis not present

## 2022-11-11 DIAGNOSIS — Z0000 Encounter for general adult medical examination without abnormal findings: Secondary | ICD-10-CM | POA: Diagnosis not present

## 2022-11-11 DIAGNOSIS — R7303 Prediabetes: Secondary | ICD-10-CM | POA: Diagnosis not present

## 2022-11-11 NOTE — Assessment & Plan Note (Signed)
 Normal exam with stable BP on olmesartan. No concerns or side effects to current medication. No change in regimen; continue low sodium diet.

## 2022-11-11 NOTE — Assessment & Plan Note (Addendum)
Prediabetes has been stable with diet changes and weight loss. We discussed small diet changes to reduce carbohydrate intake. Lab Results  Component Value Date   HGBA1C 6.2 (A) 05/11/2022

## 2022-11-11 NOTE — Assessment & Plan Note (Signed)
Lipids managed with diet only Lab Results  Component Value Date   LDLCALC 116 (H) 11/07/2021

## 2022-11-11 NOTE — Assessment & Plan Note (Addendum)
Suffering from fatigue after XRT to lymph node Repeat PSA to be done tomorrow by Urology

## 2022-11-11 NOTE — Progress Notes (Signed)
Date:  11/11/2022   Name:  Karl Gentry   DOB:  November 03, 1950   MRN:  [id number]   Chief Complaint: Annual Exam Karl Gentry is a 72 y.o. male who presents today for his Complete Annual Exam. He feels fairly well. He reports exercising - none. He reports he is sleeping fairly well.   Colonoscopy: 11/2020 hyperplastic polyp repeat 10 yrs  Immunization History  Administered Date(s) Administered   Fluad Quad(high Dose 65+) 11/03/2019, 11/05/2020, 11/07/2021   Fluad Trivalent(High Dose 65+) 11/11/2022   PFIZER(Purple Top)SARS-COV-2 Vaccination 06/01/2019, 06/27/2019   Pneumococcal Conjugate-13 06/21/2018   Pneumococcal Polysaccharide-23 11/03/2019   Zoster Recombinant(Shingrix) 11/07/2021, 05/08/2022   Health Maintenance Due  Topic Date Due   DTaP/Tdap/Td (1 - Tdap) Never done   COVID-19 Vaccine (3 - Pfizer risk series) 07/25/2019    Lab Results  Component Value Date   PSA 0.25 09/08/2022    Hypertension This is a chronic problem. The problem is controlled. Pertinent negatives include no chest pain, headaches, palpitations or shortness of breath. Past treatments include angiotensin blockers.  Diabetes He presents for his follow-up diabetic visit. Diabetes type: prediabetes. Pertinent negatives for hypoglycemia include no dizziness, headaches or nervousness/anxiousness. Pertinent negatives for diabetes include no chest pain and no fatigue. Current diabetic treatment includes diet. He is compliant with treatment some of the time.    Lab Results  Component Value Date   NA 141 11/07/2021   K 4.9 11/07/2021   CO2 24 11/07/2021   GLUCOSE 109 (H) 11/07/2021   BUN 14 11/07/2021   CREATININE 1.25 11/07/2021   CALCIUM 9.8 11/07/2021   EGFR 62 11/07/2021   GFRNONAA 93 11/03/2019   Lab Results  Component Value Date   CHOL 183 11/07/2021   HDL 42 11/07/2021   LDLCALC 116 (H) 11/07/2021   TRIG 142 11/07/2021   CHOLHDL 4.4 11/07/2021   Lab Results  Component Value Date    TSH 2.390 06/21/2018   Lab Results  Component Value Date   HGBA1C 6.2 (A) 05/11/2022   Lab Results  Component Value Date   WBC 5.6 11/07/2021   HGB 15.2 11/07/2021   HCT 43.6 11/07/2021   MCV 87 11/07/2021   PLT 192 11/07/2021   Lab Results  Component Value Date   ALT 26 11/07/2021   AST 26 11/07/2021   ALKPHOS 68 11/07/2021   BILITOT 0.5 11/07/2021   No results found for: "25OHVITD2", "25OHVITD3", "VD25OH"   Review of Systems  Constitutional:  Negative for appetite change, chills, diaphoresis, fatigue and unexpected weight change.  HENT:  Negative for hearing loss, tinnitus, trouble swallowing and voice change.   Eyes:  Negative for visual disturbance.  Respiratory:  Negative for choking, shortness of breath and wheezing.   Cardiovascular:  Negative for chest pain, palpitations and leg swelling.  Gastrointestinal:  Negative for abdominal pain, blood in stool, constipation and diarrhea.  Genitourinary:  Negative for difficulty urinating, dysuria and frequency.  Musculoskeletal:  Negative for arthralgias, back pain and myalgias.  Skin:  Negative for color change and rash.  Neurological:  Negative for dizziness, syncope and headaches.  Hematological:  Negative for adenopathy.  Psychiatric/Behavioral:  Negative for dysphoric mood and sleep disturbance. The patient is not nervous/anxious.     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) 09/23/2022   Mild hyperlipidemia 11/07/2021   Polyp of descending colon    Polyp of colon  Prediabetes 11/06/2020   BMI 40.0-44.9, adult (HCC) 11/03/2019   S/P repair of ventral hernia 04/06/2019   Essential hypertension 06/21/2018    Allergies  Allergen Reactions   Molds & Smuts     Mildew and cats also cause irritated eyes, itching, running nose    Past Surgical History:  Procedure Laterality Date   ABDOMINAL WALL DEFECT REPAIR N/A 04/06/2019    Procedure: REPAIR ABDOMINAL WALL;  Surgeon: Leafy Ro, MD;  Location: [facility] ORS;  Service: General;  Laterality: N/A;   APPENDECTOMY  04/06/2019   Procedure: APPENDECTOMY;  Surgeon: Leafy Ro, MD;  Location: [facility] ORS;  Service: General;;   BIOPSY PROSTATE  2013   bx. 2'13 [place] [facility]   COLONOSCOPY WITH PROPOFOL N/A 12/04/2020   Procedure: COLONOSCOPY WITH PROPOFOL;  Surgeon: Toney Reil, MD;  Location: [facility];  Service: Gastroenterology;  Laterality: N/A;   HERNIA REPAIR Right 1977    -open rt. right inguinal hernia repair/mesh   INGUINAL HERNIA REPAIR Bilateral 04/06/2019   Procedure: HERNIA REPAIR INGUINAL ADULT BILATERAL;  Surgeon: Leafy Ro, MD;  Location: [facility];  Service: General;  Laterality: Bilateral;   ROBOT ASSISTED LAPAROSCOPIC RADICAL PROSTATECTOMY  04/23/2011   Procedure: ROBOTIC ASSISTED LAPAROSCOPIC RADICAL PROSTATECTOMY LEVEL 2;  Surgeon: Crecencio Mc, MD;  Location: [facility];  Service: Urology;  Laterality: N/A;     VENTRAL HERNIA REPAIR N/A 04/06/2019   Procedure: HERNIA REPAIR VENTRAL ADULT;  Surgeon: Leafy Ro, MD;  Location: [facility];  Service: General;  Laterality: N/A;    Social History   Tobacco Use   Smoking status: Former    Types: Pipe    Quit date: 1970    Years since quitting: 54.7   Smokeless tobacco: Former    Types: Associate Professor status: Never Used  Substance Use Topics   Alcohol use: No   Drug use: No     Medication list has been reviewed and updated.  Current Meds  Medication Sig   olmesartan (BENICAR) 20 MG tablet TAKE 1 TABLET BY MOUTH EVERY DAY       11/11/2022    8:31 AM 05/11/2022    3:35 PM 11/07/2021    8:08 AM 05/05/2021    9:03 AM  GAD 7 : Generalized Anxiety Score  Nervous, Anxious, on Edge 0 0 0 0  Control/stop worrying 0 0 0 0  Worry too much - different things 0 0 0 0  Trouble relaxing 0 0 0 0  Restless 0 0 0 0  Easily annoyed or irritable 0 0 0 0  Afraid - awful  might happen 0 0 0 0  Total GAD 7 Score 0 0 0 0  Anxiety Difficulty Not difficult at all Not difficult at all Not difficult at all Not difficult at all       11/11/2022    8:31 AM 09/23/2022   12:46 PM 05/11/2022    3:35 PM  Depression screen PHQ 2/9  Decreased Interest 0 0 0  Down, Depressed, Hopeless 0 0 0  PHQ - 2 Score 0 0 0  Altered sleeping 0  0  Tired, decreased energy 3  0  Change in appetite 3  0  Feeling bad or failure about yourself  0  0  Trouble concentrating 0  0  Moving slowly or fidgety/restless 0  0  Suicidal thoughts 0  0  PHQ-9 Score 6  0  Difficult doing work/chores Not difficult at all  Not difficult at all    BP Readings from Last 3 Encounters:  11/11/22 128/76  09/23/22 139/78  05/11/22 122/76    Physical Exam Vitals and nursing note reviewed.  Constitutional:      Appearance: Normal appearance. He is well-developed.  HENT:     Head: Normocephalic.     Right Ear: Tympanic membrane, ear canal and external ear normal.     Left Ear: Tympanic membrane, ear canal and external ear normal.     Nose: Nose normal.  Eyes:     Conjunctiva/sclera: Conjunctivae normal.     Pupils: Pupils are equal, round, and reactive to light.  Neck:     Thyroid: No thyromegaly.     Vascular: No carotid bruit.  Cardiovascular:     Rate and Rhythm: Normal rate and regular rhythm.     Heart sounds: Normal heart sounds.  Pulmonary:     Effort: Pulmonary effort is normal.     Breath sounds: Normal breath sounds. No wheezing.  Chest:  Breasts:    Right: No mass.     Left: No mass.  Abdominal:     General: Bowel sounds are normal.     Palpations: Abdomen is soft.     Tenderness: There is no abdominal tenderness.  Musculoskeletal:        General: Normal range of motion.     Cervical back: Normal range of motion and neck supple.  Lymphadenopathy:     Cervical: No cervical adenopathy.  Skin:    General: Skin is warm and dry.  Neurological:     Mental Status: He is  alert and oriented to person, place, and time.     Deep Tendon Reflexes: Reflexes are normal and symmetric.  Psychiatric:        Attention and Perception: Attention normal.        Mood and Affect: Mood normal.        Thought Content: Thought content normal.     Wt Readings from Last 3 Encounters:  11/11/22 258 lb (117 kg)  09/23/22 261 lb 2 oz (118.4 kg)  05/11/22 253 lb (114.8 kg)    BP 128/76   Pulse 95   Ht 5\' 6"  (1.676 m)   Wt 258 lb (117 kg)   SpO2 97%   BMI 41.64 kg/m   Assessment and Plan:  Problem List Items Addressed This Visit       Unprioritized   Essential hypertension (Chronic)    Normal exam with stable BP on olmesartan. No concerns or side effects to current medication. No change in regimen; continue low sodium diet.       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 We discussed small diet changes to reduce carbohydrate intake. Lab Results  Component Value Date   HGBA1C 6.2 (A) 05/11/2022         Relevant Orders   Comprehensive metabolic panel   Hemoglobin A1c   Other Visit Diagnoses  Annual physical exam    -  Primary   up to date on screenings Flu vaccine today Tetanus due   Need for influenza vaccination       Relevant Orders   Flu Vaccine Trivalent High Dose (Fluad) (Completed)       Return in about 6 months (around 05/12/2023) for HTN.    Reubin Milan, MD [facility] [place]

## 2022-11-12 LAB — CBC WITH DIFFERENTIAL/PLATELET
Basophils Absolute: 0 10*3/uL (ref 0.0–0.2)
Basos: 1 %
EOS (ABSOLUTE): 0.2 10*3/uL (ref 0.0–0.4)
Eos: 5 %
Hematocrit: 44.7 % (ref 37.5–51.0)
Hemoglobin: 14.7 g/dL (ref 13.0–17.7)
Immature Grans (Abs): 0 10*3/uL (ref 0.0–0.1)
Immature Granulocytes: 1 %
Lymphocytes Absolute: 0.5 10*3/uL — ABNORMAL LOW (ref 0.7–3.1)
Lymphs: 11 %
MCH: 30.2 pg (ref 26.6–33.0)
MCHC: 32.9 g/dL (ref 31.5–35.7)
MCV: 92 fL (ref 79–97)
Monocytes Absolute: 0.5 10*3/uL (ref 0.1–0.9)
Monocytes: 12 %
Neutrophils Absolute: 3 10*3/uL (ref 1.4–7.0)
Neutrophils: 70 %
Platelets: 140 10*3/uL — ABNORMAL LOW (ref 150–450)
RBC: 4.87 x10E6/uL (ref 4.14–5.80)
RDW: 13.4 % (ref 11.6–15.4)
WBC: 4.3 10*3/uL (ref 3.4–10.8)

## 2022-11-12 LAB — COMPREHENSIVE METABOLIC PANEL
ALT: 39 [IU]/L (ref 0–44)
AST: 28 [IU]/L (ref 0–40)
Albumin: 4.5 g/dL (ref 3.8–4.8)
Alkaline Phosphatase: 64 [IU]/L (ref 44–121)
BUN/Creatinine Ratio: 15 (ref 10–24)
BUN: 17 mg/dL (ref 8–27)
Bilirubin Total: 0.4 mg/dL (ref 0.0–1.2)
CO2: 23 mmol/L (ref 20–29)
Calcium: 9.6 mg/dL (ref 8.6–10.2)
Chloride: 100 mmol/L (ref 96–106)
Creatinine, Ser: 1.11 mg/dL (ref 0.76–1.27)
Globulin, Total: 2.5 g/dL (ref 1.5–4.5)
Glucose: 118 mg/dL — ABNORMAL HIGH (ref 70–99)
Potassium: 5.1 mmol/L (ref 3.5–5.2)
Sodium: 137 mmol/L (ref 134–144)
Total Protein: 7 g/dL (ref 6.0–8.5)
eGFR: 71 mL/min/{1.73_m2} (ref >59)

## 2022-11-12 LAB — LIPID PANEL
Chol/HDL Ratio: 4 {ratio} (ref 0.0–5.0)
Cholesterol, Total: 167 mg/dL (ref 100–199)
HDL: 42 mg/dL (ref >39)
LDL Chol Calc (NIH): 105 mg/dL — ABNORMAL HIGH (ref 0–99)
Triglycerides: 110 mg/dL (ref 0–149)
VLDL Cholesterol Cal: 20 mg/dL (ref 5–40)

## 2022-11-12 LAB — HEMOGLOBIN A1C
Est. average glucose Bld gHb Est-mCnc: 126 mg/dL
Hgb A1c MFr Bld: 6 % — ABNORMAL HIGH (ref 4.8–5.6)

## 2023-04-16 ENCOUNTER — Other Ambulatory Visit (HOSPITAL_COMMUNITY): Payer: Self-pay | Admitting: Urology

## 2023-04-16 DIAGNOSIS — C775 Secondary and unspecified malignant neoplasm of intrapelvic lymph nodes: Secondary | ICD-10-CM

## 2023-04-16 DIAGNOSIS — C61 Malignant neoplasm of prostate: Secondary | ICD-10-CM

## 2023-04-22 ENCOUNTER — Ambulatory Visit (HOSPITAL_COMMUNITY)
Admission: RE | Admit: 2023-04-22 | Discharge: 2023-04-22 | Disposition: A | Discharge status: Home / Self Care | Payer: PRIVATE HEALTH INSURANCE | Source: Ambulatory Visit | Attending: Urology | Admitting: Urology

## 2023-04-22 DIAGNOSIS — C775 Secondary and unspecified malignant neoplasm of intrapelvic lymph nodes: Secondary | ICD-10-CM | POA: Diagnosis present

## 2023-04-22 DIAGNOSIS — C61 Malignant neoplasm of prostate: Secondary | ICD-10-CM | POA: Diagnosis present

## 2023-04-22 MED ORDER — FLOTUFOLASTAT F 18 GALLIUM 296-5846 MBQ/ML IV SOLN
8.5000 | Freq: Once | INTRAVENOUS | Status: AC
  Administered 2023-04-22: 8.5 via INTRAVENOUS
  Filled 2023-04-22: qty 9

## 2023-04-23 ENCOUNTER — Ambulatory Visit (INDEPENDENT_AMBULATORY_CARE_PROVIDER_SITE_OTHER): Payer: PRIVATE HEALTH INSURANCE | Admitting: Internal Medicine

## 2023-04-23 ENCOUNTER — Encounter: Payer: Self-pay | Admitting: Internal Medicine

## 2023-04-23 VITALS — BP 126/74 | HR 70 | Ht 66.0 in | Wt 261.2 lb

## 2023-04-23 DIAGNOSIS — Z6841 Body Mass Index (BMI) 40.0 and over, adult: Secondary | ICD-10-CM | POA: Diagnosis not present

## 2023-04-23 DIAGNOSIS — R7303 Prediabetes: Secondary | ICD-10-CM | POA: Diagnosis not present

## 2023-04-23 DIAGNOSIS — I10 Essential (primary) hypertension: Secondary | ICD-10-CM | POA: Diagnosis not present

## 2023-04-23 NOTE — Assessment & Plan Note (Signed)
 He has benefits from work and is planning to start NOOM to help with weight loss.

## 2023-04-23 NOTE — Assessment & Plan Note (Addendum)
 Controlled with diet only. Last A1C was 6.2 He will continue work on diet with NOOM

## 2023-04-23 NOTE — Progress Notes (Signed)
 Date:  04/23/2023   Name:  Karl Gentry   DOB:  1950-10-24   MRN:  [id number]   Chief Complaint: Hypertension and Prediabetes  Hypertension This is a chronic problem. The problem is controlled. Pertinent negatives include no chest pain, headaches, palpitations or shortness of breath. Past treatments include angiotensin blockers. The current treatment provides significant improvement.  Diabetes He presents for his follow-up diabetic visit. Diabetes type: prediabetes. His disease course has been improving. Pertinent negatives for hypoglycemia include no dizziness or headaches. Pertinent negatives for diabetes include no chest pain, no fatigue and no weakness.    Review of Systems  Constitutional:  Negative for fatigue and unexpected weight change.  HENT:  Negative for nosebleeds.   Eyes:  Negative for visual disturbance.  Respiratory:  Negative for cough, chest tightness, shortness of breath and wheezing.   Cardiovascular:  Negative for chest pain, palpitations and leg swelling.  Gastrointestinal:  Negative for abdominal pain, constipation and diarrhea.  Neurological:  Negative for dizziness, weakness, light-headedness and headaches.     Lab Results  Component Value Date   NA 137 11/11/2022   K 5.1 11/11/2022   CO2 23 11/11/2022   GLUCOSE 118 (H) 11/11/2022   BUN 17 11/11/2022   CREATININE 1.11 11/11/2022   CALCIUM 9.6 11/11/2022   EGFR 71 11/11/2022   GFRNONAA 93 11/03/2019   Lab Results  Component Value Date   CHOL 167 11/11/2022   HDL 42 11/11/2022   LDLCALC 105 (H) 11/11/2022   TRIG 110 11/11/2022   CHOLHDL 4.0 11/11/2022   Lab Results  Component Value Date   TSH 2.390 06/21/2018   Lab Results  Component Value Date   HGBA1C 6.0 (H) 11/11/2022   Lab Results  Component Value Date   WBC 4.3 11/11/2022   HGB 14.7 11/11/2022   HCT 44.7 11/11/2022   MCV 92 11/11/2022   PLT 140 (L) 11/11/2022   Lab Results  Component Value Date   ALT 39 11/11/2022   AST  28 11/11/2022   ALKPHOS 64 11/11/2022   BILITOT 0.4 11/11/2022   No results found for: "25OHVITD2", "25OHVITD3", "VD25OH"   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) 09/23/2022   Mild hyperlipidemia 11/07/2021   Polyp of descending colon    Polyp of colon    Prediabetes 11/06/2020   BMI 40.0-44.9, adult (HCC) 11/03/2019   S/P repair of ventral hernia 04/06/2019   Essential hypertension 06/21/2018    Allergies  Allergen Reactions   Molds & Smuts     Mildew and cats also cause irritated eyes, itching, running nose    Past Surgical History:  Procedure Laterality Date   ABDOMINAL WALL DEFECT REPAIR N/A 04/06/2019   Procedure: REPAIR ABDOMINAL WALL;  Surgeon: Leafy Ro, MD;  Location: [facility];  Service: General;  Laterality: N/A;   APPENDECTOMY  04/06/2019   Procedure: APPENDECTOMY;  Surgeon: Leafy Ro, MD;  Location: [facility];  Service: General;;   BIOPSY PROSTATE  2013   bx. 2'13 [place] [facility]   COLONOSCOPY WITH PROPOFOL N/A 12/04/2020   Procedure: COLONOSCOPY WITH PROPOFOL;  Surgeon: Toney Reil, MD;  Location: [facility];  Service: Gastroenterology;  Laterality: N/A;   HERNIA REPAIR Right 1977    -open rt. right inguinal hernia repair/mesh   INGUINAL HERNIA REPAIR Bilateral 04/06/2019   Procedure: HERNIA REPAIR INGUINAL ADULT BILATERAL;  Surgeon: Leafy Ro, MD;  Location: [facility]  ORS;  Service: General;  Laterality: Bilateral;   ROBOT ASSISTED LAPAROSCOPIC RADICAL PROSTATECTOMY  04/23/2011   Procedure: ROBOTIC ASSISTED LAPAROSCOPIC RADICAL PROSTATECTOMY LEVEL 2;  Surgeon: Crecencio Mc, MD;  Location: [facility];  Service: Urology;  Laterality: N/A;     VENTRAL HERNIA REPAIR N/A 04/06/2019   Procedure: HERNIA REPAIR VENTRAL ADULT;  Surgeon: Leafy Ro, MD;  Location: [facility] OR[facility];  Service: General;  Laterality: N/A;    Social History   Tobacco Use   Smoking  status: Former    Types: Pipe    Quit date: 1970    Years since quitting: 55.2   Smokeless tobacco: Former    Types: Engineer, drilling   Vaping status: Never Used  Substance Use Topics   Alcohol use: No   Drug use: No     Medication list has been reviewed and updated.  Current Meds  Medication Sig   olmesartan (BENICAR) 20 MG tablet TAKE 1 TABLET BY MOUTH EVERY DAY       04/23/2023    8:30 AM 11/11/2022    8:31 AM 05/11/2022    3:35 PM 11/07/2021    8:08 AM  GAD 7 : Generalized Anxiety Score  Nervous, Anxious, on Edge 0 0 0 0  Control/stop worrying 0 0 0 0  Worry too much - different things 0 0 0 0  Trouble relaxing 0 0 0 0  Restless 0 0 0 0  Easily annoyed or irritable 0 0 0 0  Afraid - awful might happen 0 0 0 0  Total GAD 7 Score 0 0 0 0  Anxiety Difficulty Not difficult at all Not difficult at all Not difficult at all Not difficult at all       04/23/2023    8:30 AM 11/11/2022    8:31 AM 09/23/2022   12:46 PM  Depression screen PHQ 2/9  Decreased Interest 0 0 0  Down, Depressed, Hopeless 0 0 0  PHQ - 2 Score 0 0 0  Altered sleeping 0 0   Tired, decreased energy 0 3   Change in appetite 3 3   Feeling bad or failure about yourself  0 0   Trouble concentrating 0 0   Moving slowly or fidgety/restless 0 0   Suicidal thoughts 0 0   PHQ-9 Score 3 6   Difficult doing work/chores Not difficult at all Not difficult at all     BP Readings from Last 3 Encounters:  04/23/23 126/74  11/11/22 128/76  09/23/22 139/78    Physical Exam Vitals and nursing note reviewed.  Constitutional:      General: He is not in acute distress.    Appearance: He is well-developed.  HENT:     Head: Normocephalic and atraumatic.  Neck:     Vascular: No carotid bruit.  Cardiovascular:     Rate and Rhythm: Normal rate and regular rhythm.     Heart sounds: No murmur heard. Pulmonary:     Effort: Pulmonary effort is normal. No respiratory distress.     Breath sounds: No wheezing or  rhonchi.  Musculoskeletal:     Cervical back: Normal range of motion.     Right lower leg: No edema.     Left lower leg: No edema.  Lymphadenopathy:     Cervical: No cervical adenopathy.  Skin:    General: Skin is warm and dry.     Capillary Refill: Capillary refill takes less than 2 seconds.     Findings: No rash.  Neurological:     General: No focal deficit present.     Mental Status: He is alert and oriented to person, place, and time.  Psychiatric:        Mood and Affect: Mood normal.        Behavior: Behavior normal.     Wt Readings from Last 3 Encounters:  04/23/23 261 lb 4 oz (118.5 kg)  11/11/22 258 lb (117 kg)  09/23/22 261 lb 2 oz (118.4 kg)    BP 126/74   Pulse 70   Ht 5\' 6"  (1.676 m)   Wt 261 lb 4 oz (118.5 kg)   SpO2 98%   BMI 42.17 kg/m   Assessment and Plan:  Problem List Items Addressed This Visit       Unprioritized   Essential hypertension - Primary (Chronic)   Controlled BP with normal exam. Current regimen is Benicar. Will continue same medications; encourage continued reduced sodium diet.       BMI 40.0-44.9, adult [facility])   He has benefits from work and is planning to start NOOM to help with weight loss.      Prediabetes (Chronic)   Controlled with diet only. Last A1C was 6.2 He will continue work on diet with NOOM        Return in about 6 months (around 10/24/2023) for CPX.    Reubin Milan, MD [facility] [place]

## 2023-04-23 NOTE — Assessment & Plan Note (Signed)
 Controlled BP with normal exam. Current regimen is Benicar. Will continue same medications; encourage continued reduced sodium diet.

## 2023-06-09 ENCOUNTER — Encounter: Payer: Self-pay | Admitting: Internal Medicine

## 2023-06-09 ENCOUNTER — Ambulatory Visit: Payer: Self-pay

## 2023-06-09 ENCOUNTER — Ambulatory Visit (INDEPENDENT_AMBULATORY_CARE_PROVIDER_SITE_OTHER): Payer: PRIVATE HEALTH INSURANCE | Admitting: Internal Medicine

## 2023-06-09 VITALS — BP 134/72 | HR 95 | Temp 98.2°F | Ht 66.0 in | Wt 250.4 lb

## 2023-06-09 DIAGNOSIS — J40 Bronchitis, not specified as acute or chronic: Secondary | ICD-10-CM | POA: Diagnosis not present

## 2023-06-09 MED ORDER — DOXYCYCLINE HYCLATE 100 MG PO TABS
100.0000 mg | ORAL_TABLET | Freq: Two times a day (BID) | ORAL | 0 refills | Status: AC
Start: 2023-06-09 — End: 2023-06-19

## 2023-06-09 NOTE — Patient Instructions (Signed)
 Get mucinex tablets - take one twice a day.

## 2023-06-09 NOTE — Telephone Encounter (Signed)
  Chief Complaint: coughing up green sputum,  Symptoms: subjective fever, fatigue, headache Frequency: over 1 week Pertinent Negatives: Patient denies SOB Disposition: [] ED /[] Urgent Care (no appt availability in office) / [x] Appointment(In office/virtual)/ []  [facility]/ [] Home Care/ [] Refused Recommended Disposition /[] [facility]/ []  Follow-up with PCP Additional Notes:  Copied from CRM [id number]. Topic: Clinical - Red Word Triage >> Jun 09, 2023  8:13 AM Karl Gentry wrote: Red Word that prompted transfer to Nurse Triage: Patient bronchitis, he is stating that it is getting worst, bad cough, headache, mucus that is green. Reason for Disposition  Coughing up rusty-colored (reddish-brown) sputum    green  Answer Assessment - Initial Assessment Questions 1. ONSET: "When did the cough begin?"      Over a week 2. SEVERITY: "How bad is the cough today?"      Bad cough 3. SPUTUM: "Describe the color of your sputum" (none, dry cough; clear, white, yellow, green)     Green  4. HEMOPTYSIS: "Are you coughing up any blood?" If so ask: "How much?" (flecks, streaks, tablespoons, etc.)     no 5. DIFFICULTY BREATHING: "Are you having difficulty breathing?" If Yes, ask: "How bad is it?" (e.g., mild, moderate, severe)    - MILD: No SOB at rest, mild SOB with walking, speaks normally in sentences, can lie down, no retractions, pulse < 100.    - MODERATE: SOB at rest, SOB with minimal exertion and prefers to sit, cannot lie down flat, speaks in phrases, mild retractions, audible wheezing, pulse 100-120.    - SEVERE: Very SOB at rest, speaks in single words, struggling to breathe, sitting hunched forward, retractions, pulse > 120      No SOB 6. FEVER: "Do you have a fever?" If Yes, ask: "What is your temperature, how was it measured, and when did it start?"     Subjective fever-   8. LUNG HISTORY: "Do you have any history of lung disease?"  (e.g., pulmonary embolus, asthma,  emphysema)     no 10. OTHER SYMPTOMS: "Do you have any other symptoms?" (e.g., runny nose, wheezing, chest pain)       Headache, fatigue,mild facial  Protocols used: Cough - Acute Productive-A-AH

## 2023-06-09 NOTE — Progress Notes (Signed)
 Date:  06/09/2023   Name:  Karl Gentry   DOB:  [birth year]   MRN:  [id number]   Chief Complaint: Cough (Patient states his has been having the cough for a week now, Phlegm (greenish), headache, ear pain, no fever, no chills, night sweating, patient stated that wife has bronchitis)  Cough This is a new problem. The current episode started in the past 7 days. The problem has been unchanged. The cough is Productive of sputum. Associated symptoms include ear pain, a sore throat and shortness of breath. Pertinent negatives include no chest pain, chills, fever or wheezing. Nothing aggravates the symptoms. He has tried OTC cough suppressant for the symptoms. There is no history of environmental allergies.    Review of Systems  Constitutional:  Positive for fatigue. Negative for chills and fever.  HENT:  Positive for ear pain and sore throat.   Respiratory:  Positive for cough and shortness of breath. Negative for wheezing.   Cardiovascular:  Negative for chest pain and palpitations.  Allergic/Immunologic: Negative for environmental allergies.  Psychiatric/Behavioral:  Negative for dysphoric mood and sleep disturbance. The patient is not nervous/anxious.      Lab Results  Component Value Date   NA 137 11/11/2022   K 5.1 11/11/2022   CO2 23 11/11/2022   GLUCOSE 118 (H) 11/11/2022   BUN 17 11/11/2022   CREATININE 1.11 11/11/2022   CALCIUM 9.6 11/11/2022   EGFR 71 11/11/2022   GFRNONAA 93 11/03/2019   Lab Results  Component Value Date   CHOL 167 11/11/2022   HDL 42 11/11/2022   LDLCALC 105 (H) 11/11/2022   TRIG 110 11/11/2022   CHOLHDL 4.0 11/11/2022   Lab Results  Component Value Date   TSH 2.390 06/21/2018   Lab Results  Component Value Date   HGBA1C 6.0 (H) 11/11/2022   Lab Results  Component Value Date   WBC 4.3 11/11/2022   HGB 14.7 11/11/2022   HCT 44.7 11/11/2022   MCV 92 11/11/2022   PLT 140 (L) 11/11/2022   Lab Results  Component Value Date   ALT 39  11/11/2022   AST 28 11/11/2022   ALKPHOS 64 11/11/2022   BILITOT 0.4 11/11/2022   No results found for: "25OHVITD2", "25OHVITD3", "VD25OH"   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) 09/23/2022   Mild hyperlipidemia 11/07/2021   Polyp of descending colon    Polyp of colon    Prediabetes 11/06/2020   BMI 40.0-44.9, adult (HCC) 11/03/2019   S/P repair of ventral hernia 04/06/2019   Essential hypertension 06/21/2018    Allergies  Allergen Reactions   Molds & Smuts     Mildew and cats also cause irritated eyes, itching, running nose    Past Surgical History:  Procedure Laterality Date   ABDOMINAL WALL DEFECT REPAIR N/A 04/06/2019   Procedure: REPAIR ABDOMINAL WALL;  Surgeon: Alben Alma, MD;  Location: [facility];  Service: General;  Laterality: N/A;   APPENDECTOMY  04/06/2019   Procedure: APPENDECTOMY;  Surgeon: Alben Alma, MD;  Location: [facility];  Service: General;;   BIOPSY PROSTATE  2013   bx. 2'13 [place] [facility]   COLONOSCOPY WITH PROPOFOL  N/A 12/04/2020   Procedure: COLONOSCOPY WITH PROPOFOL ;  Surgeon: Selena Daily, MD;  Location: [facility];  Service: Gastroenterology;  Laterality: N/A;   HERNIA REPAIR Right 1977    -open rt. right inguinal hernia repair/mesh   INGUINAL HERNIA  REPAIR Bilateral 04/06/2019   Procedure: HERNIA REPAIR INGUINAL ADULT BILATERAL;  Surgeon: Alben Alma, MD;  Location: [facility];  Service: General;  Laterality: Bilateral;   ROBOT ASSISTED LAPAROSCOPIC RADICAL PROSTATECTOMY  04/23/2011   Procedure: ROBOTIC ASSISTED LAPAROSCOPIC RADICAL PROSTATECTOMY LEVEL 2;  Surgeon: Kristeen Peto, MD;  Location: [facility];  Service: Urology;  Laterality: N/A;     VENTRAL HERNIA REPAIR N/A 04/06/2019   Procedure: HERNIA REPAIR VENTRAL ADULT;  Surgeon: Alben Alma, MD;  Location: [facility];  Service: General;  Laterality: N/A;    Social History   Tobacco  Use   Smoking status: Former    Types: Pipe    Quit date: 1970    Years since quitting: 55.3   Smokeless tobacco: Former    Types: Associate Professor status: Never Used  Substance Use Topics   Alcohol use: No   Drug use: No     Medication list has been reviewed and updated.  Current Meds  Medication Sig   doxycycline  (VIBRA -TABS) 100 MG tablet Take 1 tablet (100 mg total) by mouth 2 (two) times daily for 10 days.   olmesartan  (BENICAR ) 20 MG tablet TAKE 1 TABLET BY MOUTH EVERY DAY       06/09/2023    9:49 AM 04/23/2023    8:30 AM 11/11/2022    8:31 AM 05/11/2022    3:35 PM  GAD 7 : Generalized Anxiety Score  Nervous, Anxious, on Edge 0 0 0 0  Control/stop worrying 0 0 0 0  Worry too much - different things  0 0 0  Trouble relaxing  0 0 0  Restless  0 0 0  Easily annoyed or irritable  0 0 0  Afraid - awful might happen  0 0 0  Total GAD 7 Score  0 0 0  Anxiety Difficulty  Not difficult at all Not difficult at all Not difficult at all       06/09/2023    9:48 AM 04/23/2023    8:30 AM 11/11/2022    8:31 AM  Depression screen PHQ 2/9  Decreased Interest 0 0 0  Down, Depressed, Hopeless 0 0 0  PHQ - 2 Score 0 0 0  Altered sleeping  0 0  Tired, decreased energy  0 3  Change in appetite  3 3  Feeling bad or failure about yourself   0 0  Trouble concentrating  0 0  Moving slowly or fidgety/restless  0 0  Suicidal thoughts  0 0  PHQ-9 Score  3 6  Difficult doing work/chores  Not difficult at all Not difficult at all    BP Readings from Last 3 Encounters:  06/09/23 134/72  04/23/23 126/74  11/11/22 128/76    Physical Exam Vitals and nursing note reviewed.  Constitutional:      General: He is not in acute distress.    Appearance: Normal appearance. He is well-developed.  HENT:     Head: Normocephalic and atraumatic.     Right Ear: Tympanic membrane normal.     Left Ear: Tympanic membrane normal.     Nose:     Right Sinus: No maxillary sinus tenderness  or frontal sinus tenderness.     Left Sinus: No maxillary sinus tenderness or frontal sinus tenderness.     Mouth/Throat:     Pharynx: No oropharyngeal exudate or posterior oropharyngeal erythema.  Cardiovascular:     Rate and Rhythm: Normal rate and regular rhythm.  Pulmonary:  Effort: Pulmonary effort is normal. No respiratory distress.     Breath sounds: Transmitted upper airway sounds present. No wheezing or rhonchi.  Musculoskeletal:     Cervical back: Normal range of motion.  Lymphadenopathy:     Cervical: No cervical adenopathy.  Skin:    General: Skin is warm and dry.     Capillary Refill: Capillary refill takes less than 2 seconds.     Findings: No rash.  Neurological:     Mental Status: He is alert and oriented to person, place, and time.  Psychiatric:        Mood and Affect: Mood normal.        Behavior: Behavior normal.     Wt Readings from Last 3 Encounters:  06/09/23 250 lb 6 oz (113.6 kg)  04/23/23 261 lb 4 oz (118.5 kg)  11/11/22 258 lb (117 kg)    BP 134/72   Pulse 95   Temp 98.2 F (36.8 C)   Ht 5\' 6"  (1.676 m)   Wt 250 lb 6 oz (113.6 kg)   SpO2 97%   BMI 40.41 kg/m   Assessment and Plan:  Problem List Items Addressed This Visit   None Visit Diagnoses       Bronchitis    -  Primary   continue Dayquil and Nyquil add mucinex twice a day - samples given increase fluids; follow up if needed   Relevant Medications   doxycycline  (VIBRA -TABS) 100 MG tablet       No follow-ups on file.    Sheron Dixons, MD [facility] [place]

## 2023-06-09 NOTE — Telephone Encounter (Signed)
 Noted   Pt has a appointment.  KP

## 2023-08-18 ENCOUNTER — Other Ambulatory Visit: Payer: Self-pay | Admitting: Internal Medicine

## 2023-08-18 DIAGNOSIS — I10 Essential (primary) hypertension: Secondary | ICD-10-CM

## 2023-08-19 NOTE — Telephone Encounter (Signed)
 Requested Prescriptions  Pending Prescriptions Disp Refills   olmesartan  (BENICAR ) 20 MG tablet [Pharmacy Med Name: OLMESARTAN  MEDOXOMIL 20 MG TAB] 90 tablet 0    Sig: TAKE 1 TABLET BY MOUTH EVERY DAY     Cardiovascular:  Angiotensin Receptor Blockers Failed - 08/19/2023  5:43 PM      Failed - Cr in normal range and within 180 days    Creatinine, Ser  Date Value Ref Range Status  11/11/2022 1.11 0.76 - 1.27 mg/dL Final         Failed - K in normal range and within 180 days    Potassium  Date Value Ref Range Status  11/11/2022 5.1 3.5 - 5.2 mmol/L Final         Passed - Patient is not pregnant      Passed - Last BP in normal range    BP Readings from Last 1 Encounters:  06/09/23 134/72         Passed - Valid encounter within last 6 months    Recent Outpatient Visits           2 months ago Bronchitis   [place] [facility] at [facility], Leita DEL, MD   3 months ago Essential hypertension   [facility] at [facility], Leita DEL, MD       Future Appointments             In 3 months Justus, Leita DEL, MD [facility] at [facility], [facility]

## 2023-11-18 ENCOUNTER — Other Ambulatory Visit: Payer: Self-pay | Admitting: Internal Medicine

## 2023-11-18 DIAGNOSIS — I10 Essential (primary) hypertension: Secondary | ICD-10-CM

## 2023-11-19 NOTE — Telephone Encounter (Signed)
 Requested medications are due for refill today.  yes  Requested medications are on the active medications list.  yes  Last refill. 08/19/2023 #90 0 rf  Future visit scheduled.   yes  Notes to clinic.  Labs are expired.    Requested Prescriptions  Pending Prescriptions Disp Refills   olmesartan  (BENICAR ) 20 MG tablet [Pharmacy Med Name: OLMESARTAN  MEDOXOMIL 20 MG TAB] 90 tablet 0    Sig: TAKE 1 TABLET BY MOUTH EVERY DAY     Cardiovascular:  Angiotensin Receptor Blockers Failed - 11/19/2023  3:07 PM      Failed - Cr in normal range and within 180 days    Creatinine, Ser  Date Value Ref Range Status  11/11/2022 1.11 0.76 - 1.27 mg/dL Final         Failed - K in normal range and within 180 days    Potassium  Date Value Ref Range Status  11/11/2022 5.1 3.5 - 5.2 mmol/L Final         Failed - Valid encounter within last 6 months    Recent Outpatient Visits           5 months ago Bronchitis   [place] [facility] at [facility], Leita DEL, MD   7 months ago Essential hypertension   [place] [facility] at [facility], Leita DEL, MD       Future Appointments             In 1 week Justus Leita DEL, MD [facility] at [facility], [phone] [place]            Passed - Patient is not pregnant      Passed - Last BP in normal range    BP Readings from Last 1 Encounters:  06/09/23 134/72

## 2023-12-02 ENCOUNTER — Encounter: Payer: Self-pay | Admitting: Internal Medicine

## 2023-12-02 ENCOUNTER — Ambulatory Visit (INDEPENDENT_AMBULATORY_CARE_PROVIDER_SITE_OTHER): Payer: PRIVATE HEALTH INSURANCE | Admitting: Internal Medicine

## 2023-12-02 VITALS — BP 129/76 | HR 88 | Ht 66.0 in | Wt 252.0 lb

## 2023-12-02 DIAGNOSIS — E785 Hyperlipidemia, unspecified: Secondary | ICD-10-CM

## 2023-12-02 DIAGNOSIS — I10 Essential (primary) hypertension: Secondary | ICD-10-CM | POA: Diagnosis not present

## 2023-12-02 DIAGNOSIS — R7303 Prediabetes: Secondary | ICD-10-CM

## 2023-12-02 DIAGNOSIS — Z0000 Encounter for general adult medical examination without abnormal findings: Secondary | ICD-10-CM | POA: Diagnosis not present

## 2023-12-02 DIAGNOSIS — C775 Secondary and unspecified malignant neoplasm of intrapelvic lymph nodes: Secondary | ICD-10-CM

## 2023-12-02 DIAGNOSIS — C61 Malignant neoplasm of prostate: Secondary | ICD-10-CM | POA: Diagnosis not present

## 2023-12-02 DIAGNOSIS — Z23 Encounter for immunization: Secondary | ICD-10-CM | POA: Diagnosis not present

## 2023-12-02 DIAGNOSIS — K635 Polyp of colon: Secondary | ICD-10-CM

## 2023-12-02 NOTE — Assessment & Plan Note (Signed)
 Was not a true polyp - recommended repeat in 2032 (optional given his age at that time)

## 2023-12-02 NOTE — Assessment & Plan Note (Signed)
 Well controlled blood pressure today. Current regimen is olmesartan . No medication side effects noted.

## 2023-12-02 NOTE — Assessment & Plan Note (Signed)
 Followed by Urology - PSA has been rising.   In July, he received Eligard injection with good response.

## 2023-12-02 NOTE — Assessment & Plan Note (Signed)
 Managed with diet; he has lost 10 lbs so far this year. Will continue to monitor.

## 2023-12-02 NOTE — Progress Notes (Signed)
 Date:  12/02/2023   Name:  Karl Gentry   DOB:  [birth year]   MRN:  [id number]   Chief Complaint: Annual Exam Karl Gentry is a 74 y.o. male who presents today for his Complete Annual Exam. He feels fairly well. He reports exercising - none. He reports he is sleeping poorly. He continues to work full time and is enjoying it.  Health Maintenance  Topic Date Due   DTaP/Tdap/Td vaccine (1 - Tdap) Never done   COVID-19 Vaccine (3 - Pfizer risk series) 12/18/2023*   Colon Cancer Screening  12/05/2030   Pneumococcal Vaccine for age over 26  Completed   Flu Shot  Completed   Hepatitis C Screening  Completed   Zoster (Shingles) Vaccine  Completed   Meningitis B Vaccine  Aged Out  *Topic was postponed. The date shown is not the original due date.   Lab Results  Component Value Date   PSA 0.25 09/08/2022    Hypertension This is a chronic problem. The problem is controlled. Pertinent negatives include no chest pain, headaches, palpitations or shortness of breath. Past treatments include angiotensin blockers. The current treatment provides significant improvement.  Hyperlipidemia This is a chronic problem. Recent lipid tests were reviewed and are high. Pertinent negatives include no chest pain or shortness of breath. He is currently on no antihyperlipidemic treatment.  Diabetes He presents for his follow-up diabetic visit. Diabetes type: prediabetes. His disease course has been stable. Pertinent negatives for hypoglycemia include no dizziness or headaches. Pertinent negatives for diabetes include no chest pain, no fatigue and no weakness.  Prostate cancer - s/p surgery and XRT; followed by Urology in [place]. He is on Eligard and having hot flashes.  PSA responded well however.  Review of Systems  Constitutional:  Negative for fatigue and unexpected weight change.  HENT:  Negative for nosebleeds.   Eyes:  Negative for visual disturbance.  Respiratory:  Negative for cough, chest  tightness, shortness of breath and wheezing.   Cardiovascular:  Negative for chest pain, palpitations and leg swelling.  Gastrointestinal:  Negative for abdominal pain, constipation and diarrhea.  Neurological:  Negative for dizziness, weakness, light-headedness and headaches.     Lab Results  Component Value Date   NA 137 11/11/2022   K 5.1 11/11/2022   CO2 23 11/11/2022   GLUCOSE 118 (H) 11/11/2022   BUN 17 11/11/2022   CREATININE 1.11 11/11/2022   CALCIUM 9.6 11/11/2022   EGFR 71 11/11/2022   GFRNONAA 93 11/03/2019   Lab Results  Component Value Date   CHOL 167 11/11/2022   HDL 42 11/11/2022   LDLCALC 105 (H) 11/11/2022   TRIG 110 11/11/2022   CHOLHDL 4.0 11/11/2022   Lab Results  Component Value Date   TSH 2.390 06/21/2018   Lab Results  Component Value Date   HGBA1C 6.0 (H) 11/11/2022   Lab Results  Component Value Date   WBC 4.3 11/11/2022   HGB 14.7 11/11/2022   HCT 44.7 11/11/2022   MCV 92 11/11/2022   PLT 140 (L) 11/11/2022   Lab Results  Component Value Date   ALT 39 11/11/2022   AST 28 11/11/2022   ALKPHOS 64 11/11/2022   BILITOT 0.4 11/11/2022   No results found for: MARIEN BOLLS, VD25OH   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) 09/23/2022   Mild hyperlipidemia 11/07/2021   Polyp of descending colon    Prediabetes 11/06/2020  BMI 40.0-44.9, adult (HCC) 11/03/2019   S/P repair of ventral hernia 04/06/2019   Essential hypertension 06/21/2018    Allergies  Allergen Reactions   Molds & Smuts     Mildew and cats also cause irritated eyes, itching, running nose    Past Surgical History:  Procedure Laterality Date   ABDOMINAL WALL DEFECT REPAIR N/A 04/06/2019   Procedure: REPAIR ABDOMINAL WALL;  Surgeon: Jordis Laneta FALCON, MD;  Location: [facility];  Service: General;  Laterality: N/A;   APPENDECTOMY  04/06/2019   Procedure: APPENDECTOMY;   Surgeon: Jordis Laneta FALCON, MD;  Location: [facility];  Service: General;;   BIOPSY PROSTATE  2013   bx. 2'13 [place] [facility]   COLONOSCOPY WITH PROPOFOL  N/A 12/04/2020   Procedure: COLONOSCOPY WITH PROPOFOL ;  Surgeon: Unk Corinn Skiff, MD;  Location: [facility];  Service: Gastroenterology;  Laterality: N/A;   HERNIA REPAIR Right 1977    -open rt. right inguinal hernia repair/mesh   INGUINAL HERNIA REPAIR Bilateral 04/06/2019   Procedure: HERNIA REPAIR INGUINAL ADULT BILATERAL;  Surgeon: Jordis Laneta FALCON, MD;  Location: [facility];  Service: General;  Laterality: Bilateral;   ROBOT ASSISTED LAPAROSCOPIC RADICAL PROSTATECTOMY  04/23/2011   Procedure: ROBOTIC ASSISTED LAPAROSCOPIC RADICAL PROSTATECTOMY LEVEL 2;  Surgeon: Noretta Ferrara, MD;  Location: [facility];  Service: Urology;  Laterality: N/A;     VENTRAL HERNIA REPAIR N/A 04/06/2019   Procedure: HERNIA REPAIR VENTRAL ADULT;  Surgeon: Jordis Laneta FALCON, MD;  Location: [facility];  Service: General;  Laterality: N/A;    Social History   Tobacco Use   Smoking status: Former    Types: Pipe    Quit date: 1970    Years since quitting: 55.8   Smokeless tobacco: Former    Types: Engineer, drilling   Vaping status: Never Used  Substance Use Topics   Alcohol use: No   Drug use: No     Medication list has been reviewed and updated.  Current Meds  Medication Sig   olmesartan  (BENICAR ) 20 MG tablet TAKE 1 TABLET BY MOUTH EVERY DAY       12/02/2023    8:01 AM 06/09/2023    9:49 AM 04/23/2023    8:30 AM 11/11/2022    8:31 AM  GAD 7 : Generalized Anxiety Score  Nervous, Anxious, on Edge 0 0 0 0  Control/stop worrying 0 0 0 0  Worry too much - different things 0  0 0  Trouble relaxing 0  0 0  Restless 0  0 0  Easily annoyed or irritable 0  0 0  Afraid - awful might happen 0  0 0  Total GAD 7 Score 0  0 0  Anxiety Difficulty Not difficult at all  Not difficult at all Not difficult at all       12/02/2023    8:01 AM 06/09/2023    9:48  AM 04/23/2023    8:30 AM  Depression screen PHQ 2/9  Decreased Interest 0 0 0  Down, Depressed, Hopeless 0 0 0  PHQ - 2 Score 0 0 0  Altered sleeping 3  0  Tired, decreased energy 1  0  Change in appetite 0  3  Feeling bad or failure about yourself  0  0  Trouble concentrating 0  0  Moving slowly or fidgety/restless 0  0  Suicidal thoughts 0  0  PHQ-9 Score 4  3  Difficult doing work/chores Not difficult at all  Not difficult at all  BP Readings from Last 3 Encounters:  12/02/23 129/76  06/09/23 134/72  04/23/23 126/74    Physical Exam Vitals and nursing note reviewed.  Constitutional:      Appearance: Normal appearance. He is well-developed.  HENT:     Head: Normocephalic.     Right Ear: Tympanic membrane, ear canal and external ear normal.     Left Ear: Tympanic membrane, ear canal and external ear normal.     Nose: Nose normal.  Eyes:     Conjunctiva/sclera: Conjunctivae normal.     Pupils: Pupils are equal, round, and reactive to light.  Neck:     Thyroid: No thyromegaly.     Vascular: No carotid bruit.  Cardiovascular:     Rate and Rhythm: Normal rate and regular rhythm.     Heart sounds: Normal heart sounds.  Pulmonary:     Effort: Pulmonary effort is normal.     Breath sounds: Normal breath sounds. No wheezing.  Chest:  Breasts:    Right: No mass.     Left: No mass.  Abdominal:     General: Bowel sounds are normal.     Palpations: Abdomen is soft. There is no mass.     Tenderness: There is no abdominal tenderness.     Hernia: No hernia is present.  Musculoskeletal:        General: Normal range of motion.     Cervical back: Normal range of motion and neck supple.     Right lower leg: No edema.     Left lower leg: No edema.  Lymphadenopathy:     Cervical: No cervical adenopathy.  Skin:    General: Skin is warm and dry.     Capillary Refill: Capillary refill takes less than 2 seconds.  Neurological:     General: No focal deficit present.      Mental Status: He is alert and oriented to person, place, and time.     Deep Tendon Reflexes: Reflexes are normal and symmetric.  Psychiatric:        Attention and Perception: Attention normal.        Mood and Affect: Mood normal.        Thought Content: Thought content normal.     Wt Readings from Last 3 Encounters:  12/02/23 252 lb (114.3 kg)  06/09/23 250 lb 6 oz (113.6 kg)  04/23/23 261 lb 4 oz (118.5 kg)    BP 129/76   Pulse 88   Ht 5' 6 (1.676 m)   Wt 252 lb (114.3 kg)   SpO2 98%   BMI 40.67 kg/m   Assessment and Plan:  Problem List Items Addressed This Visit       Unprioritized   Essential hypertension (Chronic)   Well controlled blood pressure today. Current regimen is olmesartan . No medication side effects noted.        Relevant Orders   CBC with Differential/Platelet   Comprehensive metabolic panel with GFR   TSH   Urinalysis, Routine w reflex microscopic   Prediabetes (Chronic)   Managed with diet; he has lost 10 lbs so far this year. Will continue to monitor.      Relevant Orders   Hemoglobin A1c   Polyp of descending colon   Was not a true polyp - recommended repeat in 2032 (optional given his age at that time)      Mild hyperlipidemia   LDL is  Lab Results  Component Value Date   LDLCALC 105 (H) 11/11/2022  Current medication regimen is diet and weight loss. Goal LDL is < 130.       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   In July, he received Eligard injection with good response.      Other Visit Diagnoses       Annual physical exam    -  Primary   CRC screening and immunizations up to date   Relevant Orders   CBC with Differential/Platelet   Comprehensive metabolic panel with GFR   Hemoglobin A1c   Lipid panel   TSH   Urinalysis, Routine w reflex microscopic     Encounter for immunization        Relevant Orders   Flu vaccine trivalent PF, 6mos and older(Flulaval,Afluria,Fluarix,Fluzone) (Completed)       Return in about 6 months (around 06/01/2024) for HTN - Dr. Sol.    Leita HILARIO Adie, MD [facility] [place]

## 2023-12-02 NOTE — Assessment & Plan Note (Signed)
 LDL is  Lab Results  Component Value Date   LDLCALC 105 (H) 11/11/2022   Current medication regimen is diet and weight loss. Goal LDL is < 130.

## 2023-12-03 ENCOUNTER — Ambulatory Visit: Payer: Self-pay | Admitting: Internal Medicine

## 2023-12-03 LAB — HEMOGLOBIN A1C
Est. average glucose Bld gHb Est-mCnc: 120 mg/dL
Hgb A1c MFr Bld: 5.8 % — ABNORMAL HIGH (ref 4.8–5.6)

## 2023-12-03 LAB — URINALYSIS, ROUTINE W REFLEX MICROSCOPIC
Bilirubin, UA: NEGATIVE
Glucose, UA: NEGATIVE
Ketones, UA: NEGATIVE
Leukocytes,UA: NEGATIVE
Nitrite, UA: NEGATIVE
Protein,UA: NEGATIVE
RBC, UA: NEGATIVE
Specific Gravity, UA: 1.022 (ref 1.005–1.030)
Urobilinogen, Ur: 0.2 mg/dL (ref 0.2–1.0)
pH, UA: 6 (ref 5.0–7.5)

## 2023-12-03 LAB — COMPREHENSIVE METABOLIC PANEL WITH GFR
ALT: 32 IU/L (ref 0–44)
AST: 26 IU/L (ref 0–40)
Albumin: 4.3 g/dL (ref 3.8–4.8)
Alkaline Phosphatase: 72 IU/L (ref 47–123)
BUN/Creatinine Ratio: 22 (ref 10–24)
BUN: 21 mg/dL (ref 8–27)
Bilirubin Total: 0.5 mg/dL (ref 0.0–1.2)
CO2: 24 mmol/L (ref 20–29)
Calcium: 10 mg/dL (ref 8.6–10.2)
Chloride: 99 mmol/L (ref 96–106)
Creatinine, Ser: 0.94 mg/dL (ref 0.76–1.27)
Globulin, Total: 3 g/dL (ref 1.5–4.5)
Glucose: 114 mg/dL — ABNORMAL HIGH (ref 70–99)
Potassium: 4.9 mmol/L (ref 3.5–5.2)
Sodium: 137 mmol/L (ref 134–144)
Total Protein: 7.3 g/dL (ref 6.0–8.5)
eGFR: 86 mL/min/1.73 (ref >59)

## 2023-12-03 LAB — CBC WITH DIFFERENTIAL/PLATELET
Basophils Absolute: 0 x10E3/uL (ref 0.0–0.2)
Basos: 1 %
EOS (ABSOLUTE): 0.2 x10E3/uL (ref 0.0–0.4)
Eos: 4 %
Hematocrit: 41.6 % (ref 37.5–51.0)
Hemoglobin: 13.7 g/dL (ref 13.0–17.7)
Immature Grans (Abs): 0 x10E3/uL (ref 0.0–0.1)
Immature Granulocytes: 0 %
Lymphocytes Absolute: 0.8 x10E3/uL (ref 0.7–3.1)
Lymphs: 15 %
MCH: 30.6 pg (ref 26.6–33.0)
MCHC: 32.9 g/dL (ref 31.5–35.7)
MCV: 93 fL (ref 79–97)
Monocytes Absolute: 0.6 x10E3/uL (ref 0.1–0.9)
Monocytes: 12 %
Neutrophils Absolute: 3.6 x10E3/uL (ref 1.4–7.0)
Neutrophils: 68 %
Platelets: 160 x10E3/uL (ref 150–450)
RBC: 4.47 x10E6/uL (ref 4.14–5.80)
RDW: 13.2 % (ref 11.6–15.4)
WBC: 5.3 x10E3/uL (ref 3.4–10.8)

## 2023-12-03 LAB — LIPID PANEL
Chol/HDL Ratio: 3.9 ratio (ref 0.0–5.0)
Cholesterol, Total: 198 mg/dL (ref 100–199)
HDL: 51 mg/dL (ref >39)
LDL Chol Calc (NIH): 119 mg/dL — ABNORMAL HIGH (ref 0–99)
Triglycerides: 159 mg/dL — ABNORMAL HIGH (ref 0–149)
VLDL Cholesterol Cal: 28 mg/dL (ref 5–40)

## 2023-12-03 LAB — TSH: TSH: 2.29 u[IU]/mL (ref 0.450–4.500)

## 2024-02-12 ENCOUNTER — Other Ambulatory Visit: Payer: Self-pay | Admitting: Internal Medicine

## 2024-02-12 DIAGNOSIS — I10 Essential (primary) hypertension: Secondary | ICD-10-CM

## 2024-02-14 NOTE — Telephone Encounter (Signed)
 Requested Prescriptions  Pending Prescriptions Disp Refills   olmesartan  (BENICAR ) 20 MG tablet [Pharmacy Med Name: OLMESARTAN  MEDOXOMIL 20 MG TAB] 90 tablet 0    Sig: TAKE 1 TABLET BY MOUTH EVERY DAY     Cardiovascular:  Angiotensin Receptor Blockers Passed - 02/14/2024  2:54 PM      Passed - Cr in normal range and within 180 days    Creatinine, Ser  Date Value Ref Range Status  12/02/2023 0.94 0.76 - 1.27 mg/dL Final         Passed - K in normal range and within 180 days    Potassium  Date Value Ref Range Status  12/02/2023 4.9 3.5 - 5.2 mmol/L Final         Passed - Patient is not pregnant      Passed - Last BP in normal range    BP Readings from Last 1 Encounters:  12/02/23 129/76         Passed - Valid encounter within last 6 months    Recent Outpatient Visits           2 months ago Annual physical exam   [facility] at [facility], Leita DEL, MD   8 months ago Bronchitis   [facility] at [facility] Leita DEL, MD   9 months ago Essential hypertension   [facility] at [facility], Leita DEL, MD

## 2024-06-02 ENCOUNTER — Ambulatory Visit: Payer: PRIVATE HEALTH INSURANCE | Admitting: Family Medicine
